# Patient Record
Sex: Male | Born: 1945 | Race: White | Hispanic: No | Marital: Married | Smoking: Former smoker
Health system: Southern US, Community
[De-identification: ages and names within clinical notes are randomized; demographics above are authoritative.]

## PROBLEM LIST (undated history)

## (undated) DIAGNOSIS — Z79899 Other long term (current) drug therapy: Secondary | ICD-10-CM

## (undated) DIAGNOSIS — I10 Essential (primary) hypertension: Secondary | ICD-10-CM

## (undated) DIAGNOSIS — C679 Malignant neoplasm of bladder, unspecified: Secondary | ICD-10-CM

## (undated) DIAGNOSIS — R319 Hematuria, unspecified: Secondary | ICD-10-CM

## (undated) DIAGNOSIS — R55 Syncope and collapse: Secondary | ICD-10-CM

## (undated) DIAGNOSIS — J449 Chronic obstructive pulmonary disease, unspecified: Secondary | ICD-10-CM

## (undated) DIAGNOSIS — T82897A Other specified complication of cardiac prosthetic devices, implants and grafts, initial encounter: Secondary | ICD-10-CM

## (undated) DIAGNOSIS — R7989 Other specified abnormal findings of blood chemistry: Secondary | ICD-10-CM

## (undated) DIAGNOSIS — R0600 Dyspnea, unspecified: Secondary | ICD-10-CM

## (undated) DIAGNOSIS — K52832 Lymphocytic colitis: Secondary | ICD-10-CM

## (undated) DIAGNOSIS — L409 Psoriasis, unspecified: Secondary | ICD-10-CM

## (undated) DIAGNOSIS — F419 Anxiety disorder, unspecified: Secondary | ICD-10-CM

## (undated) DIAGNOSIS — F1010 Alcohol abuse, uncomplicated: Secondary | ICD-10-CM

## (undated) DIAGNOSIS — H9190 Unspecified hearing loss, unspecified ear: Secondary | ICD-10-CM

## (undated) DIAGNOSIS — I5022 Chronic systolic (congestive) heart failure: Secondary | ICD-10-CM

## (undated) DIAGNOSIS — I495 Sick sinus syndrome: Secondary | ICD-10-CM

## (undated) DIAGNOSIS — Z7901 Long term (current) use of anticoagulants: Secondary | ICD-10-CM

## (undated) DIAGNOSIS — G4733 Obstructive sleep apnea (adult) (pediatric): Secondary | ICD-10-CM

## (undated) DIAGNOSIS — Z950 Presence of cardiac pacemaker: Secondary | ICD-10-CM

## (undated) DIAGNOSIS — I429 Cardiomyopathy, unspecified: Secondary | ICD-10-CM

## (undated) DIAGNOSIS — I493 Ventricular premature depolarization: Secondary | ICD-10-CM

## (undated) DIAGNOSIS — F32A Depression, unspecified: Secondary | ICD-10-CM

## (undated) DIAGNOSIS — N3289 Other specified disorders of bladder: Secondary | ICD-10-CM

## (undated) DIAGNOSIS — I482 Chronic atrial fibrillation, unspecified: Secondary | ICD-10-CM

## (undated) DIAGNOSIS — E785 Hyperlipidemia, unspecified: Secondary | ICD-10-CM

## (undated) DIAGNOSIS — I455 Other specified heart block: Secondary | ICD-10-CM

## (undated) DIAGNOSIS — K649 Unspecified hemorrhoids: Secondary | ICD-10-CM

## (undated) DIAGNOSIS — I700 Atherosclerosis of aorta: Secondary | ICD-10-CM

## (undated) HISTORY — DX: Other specified abnormal findings of blood chemistry: R79.89

## (undated) HISTORY — PX: EYE SURGERY: SHX253

## (undated) HISTORY — DX: Long term (current) use of anticoagulants: Z79.01

---

## 2016-10-22 DIAGNOSIS — Z9841 Cataract extraction status, right eye: Secondary | ICD-10-CM

## 2016-10-22 HISTORY — PX: CATARACT EXTRACTION: SUR2

## 2016-10-22 HISTORY — PX: EYE SURGERY: SHX253

## 2016-10-22 HISTORY — DX: Cataract extraction status, right eye: Z98.41

## 2018-09-29 DIAGNOSIS — H25811 Combined forms of age-related cataract, right eye: Secondary | ICD-10-CM | POA: Insufficient documentation

## 2018-10-28 DIAGNOSIS — N529 Male erectile dysfunction, unspecified: Secondary | ICD-10-CM | POA: Insufficient documentation

## 2018-12-01 DIAGNOSIS — H25812 Combined forms of age-related cataract, left eye: Secondary | ICD-10-CM | POA: Insufficient documentation

## 2019-05-18 DIAGNOSIS — S62101A Fracture of unspecified carpal bone, right wrist, initial encounter for closed fracture: Secondary | ICD-10-CM | POA: Insufficient documentation

## 2020-09-28 ENCOUNTER — Emergency Department: Payer: Medicare Other

## 2020-09-28 ENCOUNTER — Encounter: Payer: Self-pay | Admitting: Emergency Medicine

## 2020-09-28 ENCOUNTER — Other Ambulatory Visit: Payer: Self-pay

## 2020-09-28 ENCOUNTER — Inpatient Hospital Stay
Admission: EM | Admit: 2020-09-28 | Discharge: 2020-10-01 | DRG: 229 | Disposition: A | Discharge status: Home Health | Payer: Medicare Other | Attending: Family Medicine | Admitting: Family Medicine

## 2020-09-28 DIAGNOSIS — S01111A Laceration without foreign body of right eyelid and periocular area, initial encounter: Secondary | ICD-10-CM | POA: Diagnosis present

## 2020-09-28 DIAGNOSIS — F1010 Alcohol abuse, uncomplicated: Secondary | ICD-10-CM

## 2020-09-28 DIAGNOSIS — R7989 Other specified abnormal findings of blood chemistry: Secondary | ICD-10-CM

## 2020-09-28 DIAGNOSIS — I4891 Unspecified atrial fibrillation: Secondary | ICD-10-CM | POA: Diagnosis not present

## 2020-09-28 DIAGNOSIS — E860 Dehydration: Secondary | ICD-10-CM | POA: Diagnosis present

## 2020-09-28 DIAGNOSIS — M19011 Primary osteoarthritis, right shoulder: Secondary | ICD-10-CM | POA: Diagnosis present

## 2020-09-28 DIAGNOSIS — Z20822 Contact with and (suspected) exposure to covid-19: Secondary | ICD-10-CM | POA: Diagnosis present

## 2020-09-28 DIAGNOSIS — I495 Sick sinus syndrome: Secondary | ICD-10-CM | POA: Diagnosis present

## 2020-09-28 DIAGNOSIS — W19XXXA Unspecified fall, initial encounter: Secondary | ICD-10-CM | POA: Diagnosis present

## 2020-09-28 DIAGNOSIS — R52 Pain, unspecified: Secondary | ICD-10-CM

## 2020-09-28 DIAGNOSIS — R55 Syncope and collapse: Secondary | ICD-10-CM

## 2020-09-28 DIAGNOSIS — E872 Acidosis: Secondary | ICD-10-CM | POA: Diagnosis present

## 2020-09-28 DIAGNOSIS — F410 Panic disorder [episodic paroxysmal anxiety] without agoraphobia: Secondary | ICD-10-CM | POA: Diagnosis present

## 2020-09-28 DIAGNOSIS — F419 Anxiety disorder, unspecified: Secondary | ICD-10-CM

## 2020-09-28 DIAGNOSIS — R32 Unspecified urinary incontinence: Secondary | ICD-10-CM | POA: Diagnosis present

## 2020-09-28 HISTORY — DX: Anxiety disorder, unspecified: F41.9

## 2020-09-28 LAB — CBC WITH DIFFERENTIAL/PLATELET
Abs Immature Granulocytes: 0.04 10*3/uL (ref 0.00–0.07)
Basophils Absolute: 0 10*3/uL (ref 0.0–0.1)
Basophils Relative: 0 %
Eosinophils Absolute: 0.1 10*3/uL (ref 0.0–0.5)
Eosinophils Relative: 2 %
HCT: 40.3 % (ref 39.0–52.0)
Hemoglobin: 14 g/dL (ref 13.0–17.0)
Immature Granulocytes: 1 %
Lymphocytes Relative: 12 %
Lymphs Abs: 1 10*3/uL (ref 0.7–4.0)
MCH: 35.4 pg — ABNORMAL HIGH (ref 26.0–34.0)
MCHC: 34.7 g/dL (ref 30.0–36.0)
MCV: 102 fL — ABNORMAL HIGH (ref 80.0–100.0)
Monocytes Absolute: 0.8 10*3/uL (ref 0.1–1.0)
Monocytes Relative: 10 %
Neutro Abs: 6.5 10*3/uL (ref 1.7–7.7)
Neutrophils Relative %: 75 %
Platelets: 244 10*3/uL (ref 150–400)
RBC: 3.95 MIL/uL — ABNORMAL LOW (ref 4.22–5.81)
RDW: 13.1 % (ref 11.5–15.5)
WBC: 8.5 10*3/uL (ref 4.0–10.5)
nRBC: 0 % (ref 0.0–0.2)

## 2020-09-28 NOTE — ED Triage Notes (Signed)
Pt to ED via EMS coming from home. Pt family state that pt loss consciousness and fell to the floor. Pt states that he has has dizzy spells over the past few months. EMS stated that pt seemed to have a potential new onset of Afib. Pt has taken valium 5mg  two hours ago and has consumed a little alcohol. CBG was within normal limits. Pt has laceration and abrasions on the right. HR ran from 100-150s. BP130/90.

## 2020-09-28 NOTE — ED Provider Notes (Signed)
[facility] Emergency Department Provider Note   ____________________________________________   First MD Initiated Contact with Patient 09/28/20 2131     (approximate)  I have reviewed the triage vital signs and the nursing notes.   HISTORY  Chief Complaint Loss of Consciousness  Chief complaint is syncope  HPI Ewan Grau is a 74 y.o. male who comes from home.  He is recently moved back here from [place].  There he was felt to have anxiety and had a couple blackout spells where he he was taking Zoloft and Valium.  He says these helped him.  Today he was in the bathroom and seems to have passed out and hit his head.  He was incontinent as well.  Patient does not remember the episode.  He is back to baseline now.  He was not obtunded as I understand.  Patient does not have any history of irregular heartbeat or A. fib.         History reviewed. No pertinent past medical history. Past medical history per patient is only anxiety There are no problems to display for this patient. Patient does not currently remember his medicines except for Valium anything Zoloft.   Prior to Admission medications   Not on File    Allergies Patient has no allergy information on record.  History reviewed. No pertinent family history.  Social History Social History   Tobacco Use  . Smoking status: Never Smoker  . Smokeless tobacco: Never Used  Substance Use Topics  . Alcohol use: Yes    Alcohol/week: 21.0 standard drinks    Types: 21 Cans of beer per week    Comment: 3 beers a day  . Drug use: Never    Review of Systems  Constitutional: No fever/chills Eyes: No visual changes. ENT: No sore throat. Cardiovascular: Denies chest pain. Respiratory: Denies shortness of breath. Gastrointestinal: No abdominal pain.  No nausea, no vomiting.  No diarrhea.  No constipation. Genitourinary: Negative for dysuria. Musculoskeletal: Negative for back pain. Skin:  Negative for rash. Neurological: Negative for headaches, focal weakness   ____________________________________________   PHYSICAL EXAM:  VITAL SIGNS: ED Triage Vitals  Enc Vitals Group     BP      Pulse      Resp      Temp      Temp src      SpO2      Weight      Height      Head Circumference      Peak Flow      Pain Score      Pain Loc      Pain Edu?      Excl. in Lane?     Constitutional: Alert and oriented. Well appearing and in no acute distress. Eyes: Conjunctivae are normal. PER. EOMI. Head: 2 superficial abrasions on the right side of the face just above and just below the orbit Nose: No congestion/rhinnorhea. Mouth/Throat: Mucous membranes are moist.  Oropharynx non-erythematous. Neck: No stridor.  No cervical spine tenderness to palpation. Cardiovascular: Normal rate, regular rhythm. Grossly normal heart sounds.  Good peripheral circulation. Respiratory: Normal respiratory effort.  No retractions. Lungs CTAB. Gastrointestinal: Soft and nontender. No distention. No abdominal bruits.  Musculoskeletal: No lower extremity tenderness nor edema.   Neurologic:  Normal speech and language. No gross focal neurologic deficits are appreciated. No gait instability. Skin:  Skin is warm, dry and intact. No rash noted. Psychiatric: Mood and affect are normal. Speech and  behavior are normal.  ____________________________________________   LABS (all labs ordered are listed, but only abnormal results are displayed)  Labs Reviewed  COMPREHENSIVE METABOLIC PANEL  LACTIC ACID, PLASMA  LACTIC ACID, PLASMA  BRAIN NATRIURETIC PEPTIDE  CBC WITH DIFFERENTIAL/PLATELET  TSH  TROPONIN I (HIGH SENSITIVITY)  TROPONIN I (HIGH SENSITIVITY)   ____________________________________________  EKG EMS EKG shows A. fib with a rate up to 112 no obvious acute ST-T changes.  ____________________________________________  RADIOLOGY Gertha Calkin, personally viewed and evaluated these  images (plain radiographs) as part of my medical decision making, as well as reviewing the written report by the radiologist.  ED MD interpretation: CT of the head and neck show no acute changes radiology read the films and I reviewed them.  Official radiology report(s): CT Head Wo Contrast  Result Date: 09/28/2020 CLINICAL DATA:  Loss of consciousness EXAM: CT HEAD WITHOUT CONTRAST TECHNIQUE: Contiguous axial images were obtained from the base of the skull through the vertex without intravenous contrast. COMPARISON:  None. FINDINGS: Brain: No evidence of acute territorial infarction, hemorrhage, hydrocephalus,extra-axial collection or mass lesion/mass effect. There is dilatation the ventricles and sulci consistent with age-related atrophy. Low-attenuation changes in the deep white matter consistent with small vessel ischemia. Vascular: No hyperdense vessel or unexpected calcification. Skull: The skull is intact. No fracture or focal lesion identified. Sinuses/Orbits: The visualized paranasal sinuses and mastoid air cells are clear. The orbits and globes intact. Other: None Cervical spine: Alignment: Physiologic Skull base and vertebrae: Visualized skull base is intact. No atlanto-occipital dissociation. The vertebral body heights are well maintained. No fracture or pathologic osseous lesion seen. Soft tissues and spinal canal: The visualized paraspinal soft tissues are unremarkable. No prevertebral soft tissue swelling is seen. The spinal canal is grossly unremarkable, no large epidural collection or significant canal narrowing. Disc levels: Multilevel cervical spine spondylosis is seen with disc osteophyte complex and uncovertebral osteophytes most notable at C5-C6 with severe bilateral neural foraminal narrowing and moderate central canal stenosis. Upper chest: Paraseptal emphysematous changes seen at both lung apices. Thoracic inlet is within normal limits. Other: None IMPRESSION: No acute intracranial  abnormality. Findings consistent with age related atrophy and chronic small vessel ischemia No acute fracture or malalignment of the spine. Electronically Signed   By: Prudencio Pair M.D.   On: 09/28/2020 22:21   CT Cervical Spine Wo Contrast  Result Date: 09/28/2020 CLINICAL DATA:  Loss of consciousness EXAM: CT HEAD WITHOUT CONTRAST TECHNIQUE: Contiguous axial images were obtained from the base of the skull through the vertex without intravenous contrast. COMPARISON:  None. FINDINGS: Brain: No evidence of acute territorial infarction, hemorrhage, hydrocephalus,extra-axial collection or mass lesion/mass effect. There is dilatation the ventricles and sulci consistent with age-related atrophy. Low-attenuation changes in the deep white matter consistent with small vessel ischemia. Vascular: No hyperdense vessel or unexpected calcification. Skull: The skull is intact. No fracture or focal lesion identified. Sinuses/Orbits: The visualized paranasal sinuses and mastoid air cells are clear. The orbits and globes intact. Other: None Cervical spine: Alignment: Physiologic Skull base and vertebrae: Visualized skull base is intact. No atlanto-occipital dissociation. The vertebral body heights are well maintained. No fracture or pathologic osseous lesion seen. Soft tissues and spinal canal: The visualized paraspinal soft tissues are unremarkable. No prevertebral soft tissue swelling is seen. The spinal canal is grossly unremarkable, no large epidural collection or significant canal narrowing. Disc levels: Multilevel cervical spine spondylosis is seen with disc osteophyte complex and uncovertebral osteophytes most notable at C5-C6 with  severe bilateral neural foraminal narrowing and moderate central canal stenosis. Upper chest: Paraseptal emphysematous changes seen at both lung apices. Thoracic inlet is within normal limits. Other: None IMPRESSION: No acute intracranial abnormality. Findings consistent with age related  atrophy and chronic small vessel ischemia No acute fracture or malalignment of the spine. Electronically Signed   By: Prudencio Pair M.D.   On: 09/28/2020 22:21    ____________________________________________   PROCEDURES  Procedure(s) performed (including Critical Care):  Procedures   ____________________________________________   INITIAL IMPRESSION / ASSESSMENT AND PLAN / ED COURSE  Patient with brief syncopal episodes starting about 15 months ago.  And apparently his doctor initially diagnosed him as possible anxiety.  These may have been atrial fibrillation episodes to begin with.  We will get the patient in the hospital in do a cardiac echo and evaluate him to see if we can cardiovert him or just rate control.  Make sure there is no other apparent causes for his syncope.  Patient reports he had his last tetanus shot in 2016 which should be about 5 years ago his abrasions do not appear to be deep and that should be adequate for him.             ____________________________________________   FINAL CLINICAL IMPRESSION(S) / ED DIAGNOSES  Final diagnoses:  Syncope and collapse  New onset atrial fibrillation (HCC)  Atrial fibrillation with [facility])     ED Discharge Orders    None      *Please note:  Vence Lalor was evaluated in Emergency Department on 09/28/2020 for the symptoms described in the history of present illness. He was evaluated in the context of the global COVID-19 pandemic, which necessitated consideration that the patient might be at risk for infection with the SARS-CoV-2 virus that causes COVID-19. Institutional protocols and algorithms that pertain to the evaluation of patients at risk for COVID-19 are in a state of rapid change based on information released by regulatory bodies including the CDC and federal and state organizations. These policies and algorithms were followed during the patient's care in the ED.  Some ED evaluations and interventions may be  delayed as a result of limited staffing during and the pandemic.*   Note:  This document was prepared using Dragon voice recognition software and may include unintentional dictation errors.    Nena Polio, MD 09/28/20 2342

## 2020-09-29 ENCOUNTER — Inpatient Hospital Stay: Payer: Medicare Other

## 2020-09-29 ENCOUNTER — Emergency Department: Payer: Medicare Other

## 2020-09-29 ENCOUNTER — Inpatient Hospital Stay
Admit: 2020-09-29 | Discharge: 2020-09-29 | Disposition: A | Discharge status: Home / Self Care | Payer: Medicare Other | Attending: Internal Medicine | Admitting: Internal Medicine

## 2020-09-29 ENCOUNTER — Encounter: Payer: Self-pay | Admitting: Internal Medicine

## 2020-09-29 DIAGNOSIS — Z20822 Contact with and (suspected) exposure to covid-19: Secondary | ICD-10-CM | POA: Diagnosis present

## 2020-09-29 DIAGNOSIS — F1010 Alcohol abuse, uncomplicated: Secondary | ICD-10-CM | POA: Diagnosis present

## 2020-09-29 DIAGNOSIS — R7989 Other specified abnormal findings of blood chemistry: Secondary | ICD-10-CM

## 2020-09-29 DIAGNOSIS — W19XXXA Unspecified fall, initial encounter: Secondary | ICD-10-CM | POA: Diagnosis present

## 2020-09-29 DIAGNOSIS — E872 Acidosis: Secondary | ICD-10-CM | POA: Diagnosis present

## 2020-09-29 DIAGNOSIS — E860 Dehydration: Secondary | ICD-10-CM | POA: Diagnosis present

## 2020-09-29 DIAGNOSIS — R55 Syncope and collapse: Secondary | ICD-10-CM | POA: Diagnosis present

## 2020-09-29 DIAGNOSIS — M19011 Primary osteoarthritis, right shoulder: Secondary | ICD-10-CM | POA: Diagnosis present

## 2020-09-29 DIAGNOSIS — I4891 Unspecified atrial fibrillation: Secondary | ICD-10-CM | POA: Diagnosis present

## 2020-09-29 DIAGNOSIS — S01111A Laceration without foreign body of right eyelid and periocular area, initial encounter: Secondary | ICD-10-CM | POA: Diagnosis present

## 2020-09-29 DIAGNOSIS — F419 Anxiety disorder, unspecified: Secondary | ICD-10-CM

## 2020-09-29 DIAGNOSIS — I495 Sick sinus syndrome: Secondary | ICD-10-CM | POA: Diagnosis present

## 2020-09-29 DIAGNOSIS — R32 Unspecified urinary incontinence: Secondary | ICD-10-CM | POA: Diagnosis present

## 2020-09-29 DIAGNOSIS — F410 Panic disorder [episodic paroxysmal anxiety] without agoraphobia: Secondary | ICD-10-CM | POA: Diagnosis present

## 2020-09-29 LAB — COMPREHENSIVE METABOLIC PANEL
ALT: 44 U/L (ref 0–44)
AST: 76 U/L — ABNORMAL HIGH (ref 15–41)
Albumin: 3.7 g/dL (ref 3.5–5.0)
Alkaline Phosphatase: 49 U/L (ref 38–126)
Anion gap: 10 (ref 5–15)
BUN: 10 mg/dL (ref 8–23)
CO2: 26 mmol/L (ref 22–32)
Calcium: 7.8 mg/dL — ABNORMAL LOW (ref 8.9–10.3)
Chloride: 102 mmol/L (ref 98–111)
Creatinine, Ser: 0.59 mg/dL — ABNORMAL LOW (ref 0.61–1.24)
GFR, Estimated: >60 mL/min (ref >60)
Glucose, Bld: 86 mg/dL (ref 70–99)
Potassium: 4.3 mmol/L (ref 3.5–5.1)
Sodium: 138 mmol/L (ref 135–145)
Total Bilirubin: 0.8 mg/dL (ref 0.3–1.2)
Total Protein: 6.5 g/dL (ref 6.5–8.1)

## 2020-09-29 LAB — BASIC METABOLIC PANEL
Anion gap: 10 (ref 5–15)
BUN: 10 mg/dL (ref 8–23)
CO2: 22 mmol/L (ref 22–32)
Calcium: 8.6 mg/dL — ABNORMAL LOW (ref 8.9–10.3)
Chloride: 107 mmol/L (ref 98–111)
Creatinine, Ser: 0.66 mg/dL (ref 0.61–1.24)
GFR, Estimated: >60 mL/min (ref >60)
Glucose, Bld: 93 mg/dL (ref 70–99)
Potassium: 3.8 mmol/L (ref 3.5–5.1)
Sodium: 139 mmol/L (ref 135–145)

## 2020-09-29 LAB — ECHOCARDIOGRAM COMPLETE
AR max vel: 2.63 cm2
AV Area VTI: 2.35 cm2
AV Area mean vel: 2.57 cm2
AV Mean grad: 5 mmHg
AV Peak grad: 8.5 mmHg
Ao pk vel: 1.46 m/s
Area-P 1/2: 4.1 cm2
Height: 70.5 in
S' Lateral: 3.88 cm
Weight: 2304 oz

## 2020-09-29 LAB — LACTIC ACID, PLASMA
Lactic Acid, Venous: 2.9 mmol/L (ref 0.5–1.9)
Lactic Acid, Venous: 1.1 mmol/L (ref 0.5–1.9)

## 2020-09-29 LAB — TSH: TSH: 2.721 u[IU]/mL (ref 0.350–4.500)

## 2020-09-29 LAB — CREATININE, SERUM
Creatinine, Ser: 0.55 mg/dL — ABNORMAL LOW (ref 0.61–1.24)
GFR, Estimated: >60 mL/min (ref >60)

## 2020-09-29 LAB — PHOSPHORUS: Phosphorus: 3.3 mg/dL (ref 2.5–4.6)

## 2020-09-29 LAB — BRAIN NATRIURETIC PEPTIDE: B Natriuretic Peptide: 223.1 pg/mL — ABNORMAL HIGH (ref 0.0–100.0)

## 2020-09-29 LAB — RESP PANEL BY RT-PCR (FLU A&B, COVID) ARPGX2
Influenza A by PCR: NEGATIVE
Influenza B by PCR: NEGATIVE
SARS Coronavirus 2 by RT PCR: NEGATIVE

## 2020-09-29 LAB — TROPONIN I (HIGH SENSITIVITY)
Troponin I (High Sensitivity): 7 ng/L (ref <18)
Troponin I (High Sensitivity): 8 ng/L (ref <18)

## 2020-09-29 LAB — MAGNESIUM: Magnesium: 1.7 mg/dL (ref 1.7–2.4)

## 2020-09-29 LAB — CBG MONITORING, ED: Glucose-Capillary: 95 mg/dL (ref 70–99)

## 2020-09-29 MED ORDER — ENOXAPARIN SODIUM 40 MG/0.4ML ~~LOC~~ SOLN: 40.0000 mg | SUBCUTANEOUS | Status: DC

## 2020-09-29 MED ORDER — THIAMINE HCL 100 MG PO TABS
100.0000 mg | ORAL_TABLET | Freq: Every day | ORAL | Status: DC
  Administered 2020-09-29: 100 mg via ORAL
  Filled 2020-09-29: qty 1

## 2020-09-29 MED ORDER — APIXABAN 5 MG PO TABS
5.0000 mg | ORAL_TABLET | Freq: Two times a day (BID) | ORAL | Status: DC
  Administered 2020-09-29 (×2): 5 mg via ORAL
  Filled 2020-09-29 (×2): qty 1

## 2020-09-29 MED ORDER — ADULT MULTIVITAMIN W/MINERALS CH
1.0000 | ORAL_TABLET | Freq: Every day | ORAL | Status: DC
  Administered 2020-09-29: 1 via ORAL
  Filled 2020-09-29: qty 1

## 2020-09-29 MED ORDER — FOLIC ACID 1 MG PO TABS
1.0000 mg | ORAL_TABLET | Freq: Every day | ORAL | Status: DC
  Administered 2020-09-29: 1 mg via ORAL
  Filled 2020-09-29: qty 1

## 2020-09-29 MED ORDER — THIAMINE HCL 100 MG/ML IJ SOLN: 100.0000 mg | Freq: Every day | INTRAMUSCULAR | Status: DC

## 2020-09-29 MED ORDER — SODIUM CHLORIDE 0.9 % IV SOLN: INTRAVENOUS | Status: DC

## 2020-09-29 MED ORDER — SODIUM CHLORIDE 0.9% FLUSH
3.0000 mL | Freq: Two times a day (BID) | INTRAVENOUS | Status: DC
  Administered 2020-09-29 – 2020-09-30 (×3): 3 mL via INTRAVENOUS

## 2020-09-29 MED ORDER — LORAZEPAM 2 MG/ML IJ SOLN: 0.0000 mg | Freq: Four times a day (QID) | INTRAMUSCULAR | Status: AC

## 2020-09-29 MED ORDER — ONDANSETRON HCL 4 MG PO TABS: 4.0000 mg | ORAL_TABLET | Freq: Four times a day (QID) | ORAL | Status: DC | PRN

## 2020-09-29 MED ORDER — LORAZEPAM 1 MG PO TABS: 1.0000 mg | ORAL_TABLET | ORAL | Status: DC | PRN

## 2020-09-29 MED ORDER — ACETAMINOPHEN 650 MG RE SUPP: 650.0000 mg | Freq: Four times a day (QID) | RECTAL | Status: DC | PRN

## 2020-09-29 MED ORDER — ONDANSETRON HCL 4 MG/2ML IJ SOLN: 4.0000 mg | Freq: Four times a day (QID) | INTRAMUSCULAR | Status: DC | PRN

## 2020-09-29 MED ORDER — ACETAMINOPHEN 325 MG PO TABS
650.0000 mg | ORAL_TABLET | Freq: Four times a day (QID) | ORAL | Status: DC | PRN
  Administered 2020-09-29: 650 mg via ORAL
  Filled 2020-09-29 (×2): qty 2

## 2020-09-29 MED ORDER — LORAZEPAM 2 MG/ML IJ SOLN: 0.0000 mg | Freq: Two times a day (BID) | INTRAMUSCULAR | Status: DC

## 2020-09-29 MED ORDER — LORAZEPAM 2 MG/ML IJ SOLN: 1.0000 mg | INTRAMUSCULAR | Status: DC | PRN

## 2020-09-29 NOTE — ED Notes (Signed)
Date and time results received: 09/29/20    Test: Lactic Acid Critical Value: 2.9  Name of Provider Notified: Cinda Quest, MD

## 2020-09-29 NOTE — ED Notes (Signed)
Cardiology MD notified. No orders at this time.

## 2020-09-29 NOTE — ED Notes (Signed)
Per Cardiology PA "The patient has been seen. We are going to start him on Eliquis, defer rate-control medication at this time, continue to monitor on telemetry, and echo"

## 2020-09-29 NOTE — ED Notes (Signed)
Pt given breakfast tray at this time. 

## 2020-09-29 NOTE — ED Notes (Signed)
X-ray at bedside

## 2020-09-29 NOTE — ED Notes (Signed)
Messaged cardiology per hospitalist order at this time, awaiting response

## 2020-09-29 NOTE — Progress Notes (Signed)
PROGRESS NOTE    Collin Knight  DXI:[id number] DOB: [birth year] DOA: 09/28/2020 PCP: [name], MD   Brief Narrative:  HPI: Collin Knight is a 74 y.o. male with medical history significant for anxiety, alcohol use, who presents to the emergency room after an episode of blacking out while in the bathroom.  He has no recollection of the incident but appeared to have hit his head.  He was back to baseline by arrival to the emergency room.  States he has a history of recurrent dizzy spells and falls but this has been the worst one yet.  Patient admits to drinking about 3 beers daily.  Prior to the episode he was in his usual state of health.  Denies preceding headache, visual disturbance, chest pain, shortness of breath or palpitations, lightheadedness or one-sided numbness weakness or tingling.  Was incontinent of urine while he was out.  EMS reported heart rate 100-150 A. Fib.  Patient reportedly took 2 Valium with some alcohol earlier in the day per report. ED Course: On arrival afebrile, BP 141/71 heart rate 114 with A. fib on monitor.  O2 sat 100% on room air.  While in the ER patient noted to be flipping in and out of A. fib with RVR with rates up to 130 alternating with sinus bradycardia rate in the 50s.  On blood work, troponin was 8, BNP 223, lactic acid 2.9.  TSH 2.7.  Other blood work unremarkable. EKG as reviewed by me : Sinus rhythm at 62 Imaging: Chest x-ray clear.  CT head and C-spine without evidence of acute trauma Patient hydrated in the ER.  Hospitalist consulted for admission.  While awaiting admission, patient had sinus pauses, asymptomatic, duration up to 7 seconds and paced pads placed  Assessment & Plan:   Principal Problem:   Rapid atrial fibrillation (HCC) Active Problems:   Recurrent syncope   Anxiety   Alcohol use disorder, mild, abuse   Elevated lactic acid level   New onset atrial fibrillation with RVR/tachybradycardia syndrome: Patient was detected to have new  onset atrial fibrillation with RVR and then had few pauses with the longest being 7-second.  Has been having tachycardia and bradycardia.  Cardiology has been consulted.  He has been started on Eliquis 5 mg p.o. twice daily.  Echo pending.  Defer further management to cardiology.   Recurrent syncope: Per patient, he has had approximately 6-7 episodes of passing out in last 17 months and never sought any medical attention for that.  This is the first time he presented to ED.  His syncope is likely cardiogenic.  CT head negative for any acute pathology.  EEG is pending.  Monitor for now.  Anxiety with history of panic attacks: Consider SSRI to replace anxiolytics for treatment at the time of discharge.  Alcohol use disorder, mild, abuse: No signs of withdrawal.  Continue CIWA protocol.  Lactic acidosis: Likely due to dehydration.  Resolved.  Monitor.  Right shoulder pain: Complains of right shoulder pain since the fall.  Will obtain x-ray.  DVT prophylaxis:    Code Status: Full Code  Family Communication: Patient's wife present at bedside.  Plan of care discussed with patient in length with him and his wife and he verbalized understanding and agreed with it.  Status is: Inpatient  Remains inpatient appropriate because:Inpatient level of care appropriate due to severity of illness   Dispo: The patient is from: Home              Anticipated d/c  is to: Home              Anticipated d/c date is: 1 day              Patient currently is not medically stable to d/c.        Estimated body mass index is 20.37 kg/m as calculated from the following:   Height as of this encounter: 5' 10.5" (1.791 m).   Weight as of this encounter: 65.3 kg.      Nutritional status:               Consultants:   Cardiology  Procedures:   None  Antimicrobials:  Anti-infectives (From admission, onward)   None         Subjective: Patient seen and examined.  His wife at the bedside.   Complains of right shoulder pain and some anxiety.  Denied any chest pain, shortness of breath.    Objective: Vitals:   09/29/20 0739 09/29/20 0830 09/29/20 0930 09/29/20 1030  BP:  123/88 (!) 111/91 (!) 138/98  Pulse:  (!) 106 87 81  Resp:  15 15 14   Temp:      TempSrc:      SpO2:  96% 96% 95%  Weight: 65.3 kg     Height: 5' 10.5" (1.791 m)      No intake or output data in the 24 hours ending 09/29/20 1124 Filed Weights   09/28/20 2159 09/29/20 0739  Weight: 65.3 kg 65.3 kg    Examination:  General exam: Appears calm and comfortable, laceration over right eyebrow Respiratory system: Clear to auscultation. Respiratory effort normal. Cardiovascular system: S1 & S2 heard, irregularly irregular rate and rhythm. No JVD, murmurs, rubs, gallops or clicks. No pedal edema. Gastrointestinal system: Abdomen is nondistended, soft and nontender. No organomegaly or masses felt. Normal bowel sounds heard. Central nervous system: Alert and oriented. No focal neurological deficits. Extremities: Symmetric 5 x 5 power. Skin: No rashes, lesions or ulcers Psychiatry: Judgement and insight appear normal. Mood & affect appropriate.    Data Reviewed: I have personally reviewed following labs and imaging studies  CBC: Recent Labs  Lab 09/28/20 2246  WBC 8.5  NEUTROABS 6.5  HGB 14.0  HCT 40.3  MCV 102.0*  PLT 242   Basic Metabolic Panel: Recent Labs  Lab 09/28/20 2246 09/29/20 0701  NA 138  --   K 4.3  --   CL 102  --   CO2 26  --   GLUCOSE 86  --   BUN 10  --   CREATININE 0.59* 0.55*  CALCIUM 7.8*  --   MG  --  1.7  PHOS  --  3.3   GFR: Estimated Creatinine Clearance: 72.6 mL/min (A) (by C-G formula based on SCr of 0.55 mg/dL (L)). Liver Function Tests: Recent Labs  Lab 09/28/20 2246  AST 76*  ALT 44  ALKPHOS 49  BILITOT 0.8  PROT 6.5  ALBUMIN 3.7   No results for input(s): LIPASE, AMYLASE in the last 168 hours. No results for input(s): AMMONIA in the last 168  hours. Coagulation Profile: No results for input(s): INR, PROTIME in the last 168 hours. Cardiac Enzymes: No results for input(s): CKTOTAL, CKMB, CKMBINDEX, TROPONINI in the last 168 hours. BNP (last 3 results) No results for input(s): PROBNP in the last 8760 hours. HbA1C: No results for input(s): HGBA1C in the last 72 hours. CBG: Recent Labs  Lab 09/29/20 0700  GLUCAP 95   Lipid Profile: No results  for input(s): CHOL, HDL, LDLCALC, TRIG, CHOLHDL, LDLDIRECT in the last 72 hours. Thyroid Function Tests: Recent Labs    09/28/20 2246  TSH 2.721   Anemia Panel: No results for input(s): VITAMINB12, FOLATE, FERRITIN, TIBC, IRON, RETICCTPCT in the last 72 hours. Sepsis Labs: Recent Labs  Lab 09/28/20 2246 09/29/20 0702  LATICACIDVEN 2.9* 1.1    Recent Results (from the past 240 hour(s))  Resp Panel by RT-PCR (Flu A&B, Covid) Nasopharyngeal Swab     Status: None   Collection Time: 09/29/20  7:01 AM   Specimen: Nasopharyngeal Swab; Nasopharyngeal(NP) swabs in vial transport medium  Result Value Ref Range Status   SARS Coronavirus 2 by RT PCR NEGATIVE NEGATIVE Final    Comment: (NOTE) SARS-CoV-2 target nucleic acids are NOT DETECTED.  The SARS-CoV-2 RNA is generally detectable in upper respiratory specimens during the acute phase of infection. The lowest concentration of SARS-CoV-2 viral copies this assay can detect is 138 copies/mL. A negative result does not preclude SARS-Cov-2 infection and should not be used as the sole basis for treatment or other patient management decisions. A negative result may occur with  improper specimen collection/handling, submission of specimen other than nasopharyngeal swab, presence of viral mutation(s) within the areas targeted by this assay, and inadequate number of viral copies(<138 copies/mL). A negative result must be combined with clinical observations, patient history, and epidemiological information. The expected result is  Negative.  Fact Sheet for Patients:  EntrepreneurPulse.com.au  Fact Sheet for Healthcare Providers:  IncredibleEmployment.be  This test is no t yet approved or cleared by the [place] FDA and  has been authorized for detection and/or diagnosis of SARS-CoV-2 by FDA under an Emergency Use Authorization (EUA). This EUA will remain  in effect (meaning this test can be used) for the duration of the COVID-19 declaration under Section 564(b)(1) of the Act, 21 U.S.C.section 360bbb-3(b)(1), unless the authorization is terminated  or revoked sooner.       Influenza A by PCR NEGATIVE NEGATIVE Final   Influenza B by PCR NEGATIVE NEGATIVE Final    Comment: (NOTE) The Xpert Xpress SARS-CoV-2/FLU/RSV plus assay is intended as an aid in the diagnosis of influenza from Nasopharyngeal swab specimens and should not be used as a sole basis for treatment. Nasal washings and aspirates are unacceptable for Xpert Xpress SARS-CoV-2/FLU/RSV testing.  Fact Sheet for Patients: EntrepreneurPulse.com.au  Fact Sheet for Healthcare Providers: IncredibleEmployment.be  This test is not yet approved or cleared by the [place] FDA and has been authorized for detection and/or diagnosis of SARS-CoV-2 by FDA under an Emergency Use Authorization (EUA). This EUA will remain in effect (meaning this test can be used) for the duration of the COVID-19 declaration under Section 564(b)(1) of the Act, 21 U.S.C. section 360bbb-3(b)(1), unless the authorization is terminated or revoked.  Performed at [facility], [address] [place], [place] [address]       Radiology Studies: CT Head Wo Contrast  Result Date: 09/28/2020 CLINICAL DATA:  Loss of consciousness EXAM: CT HEAD WITHOUT CONTRAST TECHNIQUE: Contiguous axial images were obtained from the base of the skull through the vertex without intravenous contrast. COMPARISON:   None. FINDINGS: Brain: No evidence of acute territorial infarction, hemorrhage, hydrocephalus,extra-axial collection or mass lesion/mass effect. There is dilatation the ventricles and sulci consistent with age-related atrophy. Low-attenuation changes in the deep white matter consistent with small vessel ischemia. Vascular: No hyperdense vessel or unexpected calcification. Skull: The skull is intact. No fracture or focal lesion identified. Sinuses/Orbits: The  visualized paranasal sinuses and mastoid air cells are clear. The orbits and globes intact. Other: None Cervical spine: Alignment: Physiologic Skull base and vertebrae: Visualized skull base is intact. No atlanto-occipital dissociation. The vertebral body heights are well maintained. No fracture or pathologic osseous lesion seen. Soft tissues and spinal canal: The visualized paraspinal soft tissues are unremarkable. No prevertebral soft tissue swelling is seen. The spinal canal is grossly unremarkable, no large epidural collection or significant canal narrowing. Disc levels: Multilevel cervical spine spondylosis is seen with disc osteophyte complex and uncovertebral osteophytes most notable at C5-C6 with severe bilateral neural foraminal narrowing and moderate central canal stenosis. Upper chest: Paraseptal emphysematous changes seen at both lung apices. Thoracic inlet is within normal limits. Other: None IMPRESSION: No acute intracranial abnormality. Findings consistent with age related atrophy and chronic small vessel ischemia No acute fracture or malalignment of the spine. Electronically Signed   By: Prudencio Pair M.D.   On: 09/28/2020 22:21   CT Cervical Spine Wo Contrast  Result Date: 09/28/2020 CLINICAL DATA:  Loss of consciousness EXAM: CT HEAD WITHOUT CONTRAST TECHNIQUE: Contiguous axial images were obtained from the base of the skull through the vertex without intravenous contrast. COMPARISON:  None. FINDINGS: Brain: No evidence of acute territorial  infarction, hemorrhage, hydrocephalus,extra-axial collection or mass lesion/mass effect. There is dilatation the ventricles and sulci consistent with age-related atrophy. Low-attenuation changes in the deep white matter consistent with small vessel ischemia. Vascular: No hyperdense vessel or unexpected calcification. Skull: The skull is intact. No fracture or focal lesion identified. Sinuses/Orbits: The visualized paranasal sinuses and mastoid air cells are clear. The orbits and globes intact. Other: None Cervical spine: Alignment: Physiologic Skull base and vertebrae: Visualized skull base is intact. No atlanto-occipital dissociation. The vertebral body heights are well maintained. No fracture or pathologic osseous lesion seen. Soft tissues and spinal canal: The visualized paraspinal soft tissues are unremarkable. No prevertebral soft tissue swelling is seen. The spinal canal is grossly unremarkable, no large epidural collection or significant canal narrowing. Disc levels: Multilevel cervical spine spondylosis is seen with disc osteophyte complex and uncovertebral osteophytes most notable at C5-C6 with severe bilateral neural foraminal narrowing and moderate central canal stenosis. Upper chest: Paraseptal emphysematous changes seen at both lung apices. Thoracic inlet is within normal limits. Other: None IMPRESSION: No acute intracranial abnormality. Findings consistent with age related atrophy and chronic small vessel ischemia No acute fracture or malalignment of the spine. Electronically Signed   By: Prudencio Pair M.D.   On: 09/28/2020 22:21   DG Chest Portable 1 View  Result Date: 09/29/2020 CLINICAL DATA:  Syncope EXAM: PORTABLE CHEST 1 VIEW COMPARISON:  None. FINDINGS: The heart size and mediastinal contours are mildly enlarged. Aortic knob calcifications are seen. Both lungs are clear. The visualized skeletal structures are unremarkable. IMPRESSION: No active disease. Electronically Signed   By: Prudencio Pair M.D.   On: 09/29/2020 00:24    Scheduled Meds: . apixaban  5 mg Oral BID  . folic acid  1 mg Oral Daily  . LORazepam  0-4 mg Intravenous Q6H   Followed by  . [START ON 10/01/2020] LORazepam  0-4 mg Intravenous Q12H  . multivitamin with minerals  1 tablet Oral Daily  . sodium chloride flush  3 mL Intravenous Q12H  . thiamine  100 mg Oral Daily   Or  . thiamine  100 mg Intravenous Daily   Continuous Infusions: . sodium chloride 125 mL/hr at 09/29/20 0512     LOS: 0  days   Time spent: 36 minutes   Darliss Cheney, MD [facility]  09/29/2020, 11:24 AM   To contact the attending provider between 7A-7P or the covering provider during after hours 7P-7A, please log into the web site [email or web address].

## 2020-09-29 NOTE — ED Notes (Signed)
Ultrasound at bedside

## 2020-09-29 NOTE — H&P (Signed)
History and Physical    Collin Knight TOI:[id number] DOB: 1946/08/27 DOA: 09/28/2020  PCP: [name], MD   Patient coming from: Home  I have personally briefly reviewed patient's old medical records in [place]  Chief Complaint: Passed out  HPI: Collin Knight is a 74 y.o. male with medical history significant for anxiety, alcohol use, who presents to the emergency room after an episode of blacking out while in the bathroom.  He has no recollection of the incident but appeared to have hit his head.  He was back to baseline by arrival to the emergency room.  States he has a history of recurrent dizzy spells and falls but this has been the worst one yet.  Patient admits to drinking about 3 beers daily.  Prior to the episode he was in his usual state of health.  Denies preceding headache, visual disturbance, chest pain, shortness of breath or palpitations, lightheadedness or one-sided numbness weakness or tingling.  Was incontinent of urine while he was out.  EMS reported heart rate 100-150 A. Fib.  Patient reportedly took 2 Valium with some alcohol earlier in the day per report. ED Course: On arrival afebrile, BP 141/71 heart rate 114 with A. fib on monitor.  O2 sat 100% on room air.  While in the ER patient noted to be flipping in and out of A. fib with RVR with rates up to 130 alternating with sinus bradycardia rate in the 50s.  On blood work, troponin was 8, BNP 223, lactic acid 2.9.  TSH 2.7.  Other blood work unremarkable. EKG as reviewed by me : Sinus rhythm at 62 Imaging: Chest x-ray clear.  CT head and C-spine without evidence of acute trauma Patient hydrated in the ER.  Hospitalist consulted for admission.  While awaiting admission, patient had sinus pauses, asymptomatic, duration up to 7 seconds and paced pads placed   Review of Systems: As per HPI otherwise all other systems on review of systems negative.    Past Medical History:  Diagnosis Date  . Anxiety     History  reviewed. No pertinent surgical history.   reports that he has never smoked. He has never used smokeless tobacco. He reports current alcohol use of about 21.0 standard drinks of alcohol per week. He reports that he does not use drugs.  No Known Allergies  History reviewed. No pertinent family history.    Prior to Admission medications   Medication Sig Start Date End Date Taking? Authorizing Provider  diazepam (VALIUM) 5 MG tablet Take 5 mg by mouth 2 (two) times daily as needed. 09/13/20  Yes [provider]  sertraline (ZOLOFT) 25 MG tablet Take 25 mg by mouth daily. 09/06/20  Yes [provider]    Physical Exam: Vitals:   09/28/20 2158 09/28/20 2159  BP: (!) 141/71   Pulse: 94   Resp: 20   Temp: 98.6 F (37 C)   TempSrc: Oral   SpO2: 100%   Weight:  65.3 kg  Height:  5' 10.5" (1.791 m)     Vitals:   09/28/20 2158 09/28/20 2159  BP: (!) 141/71   Pulse: 94   Resp: 20   Temp: 98.6 F (37 C)   TempSrc: Oral   SpO2: 100%   Weight:  65.3 kg  Height:  5' 10.5" (1.791 m)      Constitutional: Alert and oriented x 3 . Not in any apparent distress HEENT:      Head: Normocephalic and laceration right eyebrow.  Eyes: PERLA, EOMI, Conjunctivae are normal. Sclera is non-icteric.       Mouth/Throat: Mucous membranes are moist.       Neck: Supple with no signs of meningismus. Cardiovascular:  Irregularly irregular and tachycardic at time of exam. No murmurs, gallops, or rubs. 2+ symmetrical distal pulses are present . No JVD. No LE edema Respiratory: Respiratory effort normal .Lungs sounds clear bilaterally. No wheezes, crackles, or rhonchi.  Gastrointestinal: Soft, non tender, and non distended with positive bowel sounds. No rebound or guarding. Genitourinary: No CVA tenderness. Musculoskeletal: Nontender with normal range of motion in all extremities. No cyanosis, or erythema of extremities. Neurologic:  Face is symmetric. Moving all extremities.  No gross focal neurologic deficits . Skin: Skin is warm, dry.  No rash or ulcers Psychiatric: Appears anxious   Labs on Admission: I have personally reviewed following labs and imaging studies  CBC: Recent Labs  Lab 09/28/20 2246  WBC 8.5  NEUTROABS 6.5  HGB 14.0  HCT 40.3  MCV 102.0*  PLT 326   Basic Metabolic Panel: Recent Labs  Lab 09/28/20 2246  NA 138  K 4.3  CL 102  CO2 26  GLUCOSE 86  BUN 10  CREATININE 0.59*  CALCIUM 7.8*   GFR: Estimated Creatinine Clearance: 72.6 mL/min (A) (by C-G formula based on SCr of 0.59 mg/dL (L)). Liver Function Tests: Recent Labs  Lab 09/28/20 2246  AST 76*  ALT 44  ALKPHOS 49  BILITOT 0.8  PROT 6.5  ALBUMIN 3.7   No results for input(s): LIPASE, AMYLASE in the last 168 hours. No results for input(s): AMMONIA in the last 168 hours. Coagulation Profile: No results for input(s): INR, PROTIME in the last 168 hours. Cardiac Enzymes: No results for input(s): CKTOTAL, CKMB, CKMBINDEX, TROPONINI in the last 168 hours. BNP (last 3 results) No results for input(s): PROBNP in the last 8760 hours. HbA1C: No results for input(s): HGBA1C in the last 72 hours. CBG: No results for input(s): GLUCAP in the last 168 hours. Lipid Profile: No results for input(s): CHOL, HDL, LDLCALC, TRIG, CHOLHDL, LDLDIRECT in the last 72 hours. Thyroid Function Tests: Recent Labs    09/28/20 2246  TSH 2.721   Anemia Panel: No results for input(s): VITAMINB12, FOLATE, FERRITIN, TIBC, IRON, RETICCTPCT in the last 72 hours. Urine analysis: No results found for: COLORURINE, APPEARANCEUR, LABSPEC, PHURINE, GLUCOSEU, HGBUR, BILIRUBINUR, KETONESUR, PROTEINUR, UROBILINOGEN, NITRITE, LEUKOCYTESUR  Radiological Exams on Admission: CT Head Wo Contrast  Result Date: 09/28/2020 CLINICAL DATA:  Loss of consciousness EXAM: CT HEAD WITHOUT CONTRAST TECHNIQUE: Contiguous axial images were obtained from the base of the skull through the vertex without  intravenous contrast. COMPARISON:  None. FINDINGS: Brain: No evidence of acute territorial infarction, hemorrhage, hydrocephalus,extra-axial collection or mass lesion/mass effect. There is dilatation the ventricles and sulci consistent with age-related atrophy. Low-attenuation changes in the deep white matter consistent with small vessel ischemia. Vascular: No hyperdense vessel or unexpected calcification. Skull: The skull is intact. No fracture or focal lesion identified. Sinuses/Orbits: The visualized paranasal sinuses and mastoid air cells are clear. The orbits and globes intact. Other: None Cervical spine: Alignment: Physiologic Skull base and vertebrae: Visualized skull base is intact. No atlanto-occipital dissociation. The vertebral body heights are well maintained. No fracture or pathologic osseous lesion seen. Soft tissues and spinal canal: The visualized paraspinal soft tissues are unremarkable. No prevertebral soft tissue swelling is seen. The spinal canal is grossly unremarkable, no large epidural collection or significant canal narrowing. Disc levels: Multilevel cervical spine  spondylosis is seen with disc osteophyte complex and uncovertebral osteophytes most notable at C5-C6 with severe bilateral neural foraminal narrowing and moderate central canal stenosis. Upper chest: Paraseptal emphysematous changes seen at both lung apices. Thoracic inlet is within normal limits. Other: None IMPRESSION: No acute intracranial abnormality. Findings consistent with age related atrophy and chronic small vessel ischemia No acute fracture or malalignment of the spine. Electronically Signed   By: Prudencio Pair M.D.   On: 09/28/2020 22:21   CT Cervical Spine Wo Contrast  Result Date: 09/28/2020 CLINICAL DATA:  Loss of consciousness EXAM: CT HEAD WITHOUT CONTRAST TECHNIQUE: Contiguous axial images were obtained from the base of the skull through the vertex without intravenous contrast. COMPARISON:  None. FINDINGS:  Brain: No evidence of acute territorial infarction, hemorrhage, hydrocephalus,extra-axial collection or mass lesion/mass effect. There is dilatation the ventricles and sulci consistent with age-related atrophy. Low-attenuation changes in the deep white matter consistent with small vessel ischemia. Vascular: No hyperdense vessel or unexpected calcification. Skull: The skull is intact. No fracture or focal lesion identified. Sinuses/Orbits: The visualized paranasal sinuses and mastoid air cells are clear. The orbits and globes intact. Other: None Cervical spine: Alignment: Physiologic Skull base and vertebrae: Visualized skull base is intact. No atlanto-occipital dissociation. The vertebral body heights are well maintained. No fracture or pathologic osseous lesion seen. Soft tissues and spinal canal: The visualized paraspinal soft tissues are unremarkable. No prevertebral soft tissue swelling is seen. The spinal canal is grossly unremarkable, no large epidural collection or significant canal narrowing. Disc levels: Multilevel cervical spine spondylosis is seen with disc osteophyte complex and uncovertebral osteophytes most notable at C5-C6 with severe bilateral neural foraminal narrowing and moderate central canal stenosis. Upper chest: Paraseptal emphysematous changes seen at both lung apices. Thoracic inlet is within normal limits. Other: None IMPRESSION: No acute intracranial abnormality. Findings consistent with age related atrophy and chronic small vessel ischemia No acute fracture or malalignment of the spine. Electronically Signed   By: Prudencio Pair M.D.   On: 09/28/2020 22:21   DG Chest Portable 1 View  Result Date: 09/29/2020 CLINICAL DATA:  Syncope EXAM: PORTABLE CHEST 1 VIEW COMPARISON:  None. FINDINGS: The heart size and mediastinal contours are mildly enlarged. Aortic knob calcifications are seen. Both lungs are clear. The visualized skeletal structures are unremarkable. IMPRESSION: No active disease.  Electronically Signed   By: Prudencio Pair M.D.   On: 09/29/2020 00:24     Assessment/Plan 74 year old male with a history of anxiety on Valium, alcohol use, presenting with a syncopal episode.  Found to be in A. fib  New onset rapid atrial fibrillation ([place]) Tachybradycardia syndrome -Patient presented with a syncopal episode, with rhythm strips on monitor showing paroxysmal rapid A. fib alternating with sinus bradycardia  -CHA2DS2-VASc of 2 on the basis of age -Cardiology consult.  Defer to cardiology for recommendations regarding anticoagulation -Holding rate control agents given fluctuating heart rate -Addendum: Following admission, patient noted to have sinus pauses up to 7 seconds.  Pacer pads placed, updated to stepdown.  Cardiology updated -Continuous cardiac monitoring, echocardiogram -IV hydration   Recurrent syncope -Suspect multifactorial related to A. fib, sinus bradycardia with pauses Valium for anxiety and ongoing alcohol use -Treat A. fib as above -Continuous cardiac monitoring, echocardiogram -Given urinary incontinence, will get EEG no syncope likely related to above -Neurology consult (can consider deferring based on new finding of sinus bradycardia with cardiac pauses, likely etiology)    Anxiety -Consider SSRI to replace anxiolytics for treatment  Alcohol use disorder, mild, abuse -EtOH withdrawal protocol -Counseled on abstinence    Elevated lactic acid level -Likely related to alcohol use.  No evidence of sepsis    DVT prophylaxis: Lovenox  Code Status: full code  Family Communication:  none  Disposition Plan: Back to previous home environment Consults called: Cardiology, neurology  Status:At the time of admission, it appears that the appropriate admission status for this patient is INPATIENT. This is judged to be reasonable and necessary in order to provide the required intensity of service to ensure the patient's safety given the presenting symptoms,  physical exam findings, and initial radiographic and laboratory data in the context of their  Comorbid conditions.   Patient requires inpatient status due to high intensity of service, high risk for further deterioration and high frequency of surveillance required.   I certify that at the point of admission it is my clinical judgment that the patient will require inpatient hospital care spanning beyond [place] MD [facility]     09/29/2020, 2:47 AM

## 2020-09-29 NOTE — Consult Note (Signed)
CARDIOLOGY CONSULT NOTE               Patient ID: Collin Knight MRN: [id number] DOB/AGE: 1946-02-19 74 y.o.  Admit date: 09/28/2020 Referring Physician Damita Dunnings Primary Physician Kary Kos (will be reestablishing care in 11/2020) Primary Cardiologist none per patient Reason for Consultation syncope, sinus pauses, new onset atrial fibrillation with RVR  HPI: 74 year old male referred for evaluation of syncope, sinus pauses, and new onset atrial fibrillation with RVR. The patient has a history of anxiety, alcohol abuse, and recurrent syncope/presyncope. The patient was reportedly in his normal state of health yesterday evening when he walked to the bathroom and had a syncopal episode upon entering the bathroom with no preceding chest pain, shortness of breath, palpitations, vision changes, lightheadedness, or dizziness. The patient states he has experienced syncopal and near syncopal episodes for 7 years, though typically has what he describes as a preceding panic attack, consisting of abdominal discomfort, nausea, palpitations, and dizziness. He is usually able to hold on to a nearby object or sit down when he feels the dizziness and does not usually pass out, or regains consciousness quickly. He was evaluated by a cardiologist in [facility] where he lived about 4 years ago, with no etiology found for his symptoms. The patient has been treated for anxiety for the last 4 years with Valium 5 mg BID and Zoloft. The patient also drinks 16-18 ounces of wine per day for anxiety, but recently switched to 3 beers per day. The patient had Valium at 7:30 that morning, had Zoloft in the afternoon, drank one beer in the evening, and had the syncopal episode around 6 PM. The patient reports a similar episode of syncope 10 days at home with no preceding symptoms. The patient was taken to [facility] ER via EMS and was noted to be in atrial fibrillation with rates in the 110s. Pertinent admission labs were BUN 10, creatinine 0.59,  high sensitivity troponin 7 and 8, lactic acid 2.9, followed by 1.1, BNP 223, hemoglobin 14, hematocrit 40.3, TSH 2.72. Chest xray negative for acute disease process. Initial ECG revealed sinus rhythm at a rate of 63 bpm. While on telemetry, the patient was noted to have a 7 second pause, though was asymptomatic, and pacer pads were placed. The patient has a chads vasc score of 2 (age). He denies a history of MI, stroke, heart failure, diabetes, or DVT/PE. At this time, the patient reports palpitations, denies dizziness, lightheadedness, chest pain, or shortness of breath.  Review of systems complete and found to be negative unless listed above     Past Medical History:  Diagnosis Date  . Anxiety     History reviewed. No pertinent surgical history.  (Not in a hospital admission)  Social History   Socioeconomic History  . Marital status: Married    Spouse name: Not on file  . Number of children: Not on file  . Years of education: Not on file  . Highest education level: Not on file  Occupational History  . Not on file  Tobacco Use  . Smoking status: Never Smoker  . Smokeless tobacco: Never Used  Substance and Sexual Activity  . Alcohol use: Yes    Alcohol/week: 21.0 standard drinks    Types: 21 Cans of beer per week    Comment: 3 beers a day  . Drug use: Never  . Sexual activity: Yes  Other Topics Concern  . Not on file  Social History Narrative  . Not on file  Social Determinants of Health   Financial Resource Strain: Not on file  Food Insecurity: Not on file  Transportation Needs: Not on file  Physical Activity: Not on file  Stress: Not on file  Social Connections: Not on file  Intimate Partner Violence: Not on file    History reviewed. No pertinent family history.    Review of systems complete and found to be negative unless listed above      PHYSICAL EXAM  General: Well developed, well nourished, in no acute distress, lying in bed HEENT:  Right  periorbital bruising  Neck:  No JVD.  Lungs: Clear bilaterally to auscultation, normal effort of breathing on room air. Heart: irregularly irregular, without gallops or murmurs.  Abdomen: nondistended Msk:  No obvious deformities, gait not assessed Extremities: No clubbing, cyanosis or edema.   Neuro: Alert and oriented X 3. Psych:  Good affect, responds appropriately  Labs:   Lab Results  Component Value Date   WBC 8.5 09/28/2020   HGB 14.0 09/28/2020   HCT 40.3 09/28/2020   MCV 102.0 (H) 09/28/2020   PLT 244 09/28/2020    Recent Labs  Lab 09/28/20 2246 09/29/20 0701  NA 138  --   K 4.3  --   CL 102  --   CO2 26  --   BUN 10  --   CREATININE 0.59* 0.55*  CALCIUM 7.8*  --   PROT 6.5  --   BILITOT 0.8  --   ALKPHOS 49  --   ALT 44  --   AST 76*  --   GLUCOSE 86  --    No results found for: CKTOTAL, CKMB, CKMBINDEX, TROPONINI No results found for: CHOL No results found for: HDL No results found for: LDLCALC No results found for: TRIG No results found for: CHOLHDL No results found for: LDLDIRECT    Radiology: CT Head Wo Contrast  Result Date: 09/28/2020 CLINICAL DATA:  Loss of consciousness EXAM: CT HEAD WITHOUT CONTRAST TECHNIQUE: Contiguous axial images were obtained from the base of the skull through the vertex without intravenous contrast. COMPARISON:  None. FINDINGS: Brain: No evidence of acute territorial infarction, hemorrhage, hydrocephalus,extra-axial collection or mass lesion/mass effect. There is dilatation the ventricles and sulci consistent with age-related atrophy. Low-attenuation changes in the deep white matter consistent with small vessel ischemia. Vascular: No hyperdense vessel or unexpected calcification. Skull: The skull is intact. No fracture or focal lesion identified. Sinuses/Orbits: The visualized paranasal sinuses and mastoid air cells are clear. The orbits and globes intact. Other: None Cervical spine: Alignment: Physiologic Skull base and  vertebrae: Visualized skull base is intact. No atlanto-occipital dissociation. The vertebral body heights are well maintained. No fracture or pathologic osseous lesion seen. Soft tissues and spinal canal: The visualized paraspinal soft tissues are unremarkable. No prevertebral soft tissue swelling is seen. The spinal canal is grossly unremarkable, no large epidural collection or significant canal narrowing. Disc levels: Multilevel cervical spine spondylosis is seen with disc osteophyte complex and uncovertebral osteophytes most notable at C5-C6 with severe bilateral neural foraminal narrowing and moderate central canal stenosis. Upper chest: Paraseptal emphysematous changes seen at both lung apices. Thoracic inlet is within normal limits. Other: None IMPRESSION: No acute intracranial abnormality. Findings consistent with age related atrophy and chronic small vessel ischemia No acute fracture or malalignment of the spine. Electronically Signed   By: Prudencio Pair M.D.   On: 09/28/2020 22:21   CT Cervical Spine Wo Contrast  Result Date: 09/28/2020 CLINICAL DATA:  Loss  of consciousness EXAM: CT HEAD WITHOUT CONTRAST TECHNIQUE: Contiguous axial images were obtained from the base of the skull through the vertex without intravenous contrast. COMPARISON:  None. FINDINGS: Brain: No evidence of acute territorial infarction, hemorrhage, hydrocephalus,extra-axial collection or mass lesion/mass effect. There is dilatation the ventricles and sulci consistent with age-related atrophy. Low-attenuation changes in the deep white matter consistent with small vessel ischemia. Vascular: No hyperdense vessel or unexpected calcification. Skull: The skull is intact. No fracture or focal lesion identified. Sinuses/Orbits: The visualized paranasal sinuses and mastoid air cells are clear. The orbits and globes intact. Other: None Cervical spine: Alignment: Physiologic Skull base and vertebrae: Visualized skull base is intact. No  atlanto-occipital dissociation. The vertebral body heights are well maintained. No fracture or pathologic osseous lesion seen. Soft tissues and spinal canal: The visualized paraspinal soft tissues are unremarkable. No prevertebral soft tissue swelling is seen. The spinal canal is grossly unremarkable, no large epidural collection or significant canal narrowing. Disc levels: Multilevel cervical spine spondylosis is seen with disc osteophyte complex and uncovertebral osteophytes most notable at C5-C6 with severe bilateral neural foraminal narrowing and moderate central canal stenosis. Upper chest: Paraseptal emphysematous changes seen at both lung apices. Thoracic inlet is within normal limits. Other: None IMPRESSION: No acute intracranial abnormality. Findings consistent with age related atrophy and chronic small vessel ischemia No acute fracture or malalignment of the spine. Electronically Signed   By: Prudencio Pair M.D.   On: 09/28/2020 22:21   DG Chest Portable 1 View  Result Date: 09/29/2020 CLINICAL DATA:  Syncope EXAM: PORTABLE CHEST 1 VIEW COMPARISON:  None. FINDINGS: The heart size and mediastinal contours are mildly enlarged. Aortic knob calcifications are seen. Both lungs are clear. The visualized skeletal structures are unremarkable. IMPRESSION: No active disease. Electronically Signed   By: Prudencio Pair M.D.   On: 09/29/2020 00:24    EKG: sinus rhythm, 63 bpm; telemetry shows atrial fibrillation with rates 90s-140s.  ASSESSMENT AND PLAN:  1. New onset atrial fibrillation, with a chads vasc score of 2, with rates in the 90s-140s, with a history of alcohol abuse. Patient has had syncopal and near syncopal episodes with preceding "panic attacks" consisting of palpitations, nausea, abdominal pain, and nausea for 7 years, though atrial fibrillation was not previously detected.  2. Sinus pause and intermittent bradycardia, noted on telemetry, patient asymptomatic at the time, no recurrence. Pacer pads  placed. 3. Syncope, as per above, patient has a long history of recurrent episodes, though typically with preceding symptoms. This episode and an episode 10 days ago were sudden with no prodromal symptoms. Possibly secondary to atrial fibrillation and/or pauses 4. Alcohol abuse, drinks 16-18 ounces of wine or 3 beers per day. 5. Anxiety, takes Valium 5 mg BID and Zoloft daily  Recommendations: 1. Continue to monitor on telemetry 2. Defer temporary or permanent pacemaker at this time 3. Defer rate controlling medications at this time 4. Initiate Eliquis 5 mg BID for stroke prevention 5. Review 2D echocardiogram 6. Advised patient to avoid/limit alcohol consumption 7. Further recommendations pending patient's initial course  Discussed with Dr. Saralyn Pilar who agrees with the above plan.  Signed: Clabe Seal PA-C 09/29/2020, 8:25 AM

## 2020-09-29 NOTE — Procedures (Incomplete)
Patient Name: Collin Knight  MRN: [id number]  Epilepsy Attending: Lora Havens  Referring Physician/Provider: Tiana Loft Date: 09/29/2020 Duration:   Patient history: 74 year old male with a history of anxiety on Valium, alcohol use, presenting with a syncopal episode. EEG to evaluate for seizure  Level of alertness: Awake, drowsy, sleep, comatose, lethargic  AEDs during EEG study: None  Technical aspects: This EEG study was done with scalp electrodes positioned according to the 10-20 International system of electrode placement. Electrical activity was acquired at a sampling rate of 500Hz  and reviewed with a high frequency filter of 70Hz  and a low frequency filter of 1Hz . EEG data were recorded continuously and digitally stored.   Description: The posterior dominant rhythm consists of 9-10 Hz activity of moderate voltage (25-35 uV) seen predominantly in posterior head regions, symmetric and reactive to eye opening and eye closing. Drowsiness was characterized by attenuation of the posterior background rhythm. Sleep was characterized by vertex waves, sleep spindles (12 to 14 Hz), maximal frontocentral region.  There is an excessive amount of 15 to 18 Hz, 2-3 uV beta activity with irregular morphology distributed symmetrically and diffusely.   EEG showed continuous/intermittent generalized 3 to 6 Hz theta-delta slowing.  Hyperventilation did not show any EEG change.  Physiology photic driving was not seen during photic stimulation.  Hyperventilation and photic stimulation were not performed.     ABNORMALITY -Excessive beta, generalized -Intermittent slow, generalized -Continued slow, generalized -Triphasic waves, generalized -Sharp wave, -Spike, -Periodic epileptiform discharges, generalized/lateralized   IMPRESSION: This study is within normal limits.  The study suggestive of mild/moderate/severe diffuse encephalopathy, nonspecific etiology but likely related to sedation,  toxic-metabolic etiology, anoxic/hypoxic brain injury This study showed evidence of potential epileptogenicity arising from The study showed seizures arising from No seizures or epileptiform discharges were seen throughout the recording. However, only wakefulness and drowsiness were recorded. If suspicion for interictal activity remains a concern, a prolonged study including sleep should be considered.  The excessive beta activity seen in the background is most likely due to the effect of benzodiazepine and is a benign EEG pattern.   Willoughby Doell Barbra Sarks

## 2020-09-29 NOTE — TOC Initial Note (Signed)
Transition of Care [facility]) - Initial/Assessment Note    Patient Details  Name: Collin Knight MRN: [id number] Date of Birth: 01-18-1946  Transition of Care [facility]) CM/SW Contact:    Ova Freshwater Phone Number: [phone] 09/29/2020, 2:35 PM  Clinical Narrative:                  Patient was with ultrasound tech when CSW went to assess patient.       Patient Goals and CMS Choice        Expected Discharge Plan and Services                                                Prior Living Arrangements/Services                       Activities of Daily Living      Permission Sought/Granted                  Emotional Assessment              Admission diagnosis:  Rapid atrial fibrillation [facility]) [I48.91] Patient Active Problem List   Diagnosis Date Noted  . Rapid atrial fibrillation ([place]) 09/29/2020  . Recurrent syncope 09/29/2020  . Anxiety 09/29/2020  . Alcohol use disorder, mild, abuse 09/29/2020  . Elevated lactic acid level 09/29/2020   PCP:  [name], MD Pharmacy:  No Pharmacies Listed    Social Determinants of Health (SDOH) Interventions    Readmission Risk Interventions No flowsheet data found.

## 2020-09-29 NOTE — Progress Notes (Signed)
*  PRELIMINARY RESULTS* Echocardiogram 2D Echocardiogram has been performed.  Collin Knight [name] 09/29/2020, 12:30 PM

## 2020-09-29 NOTE — ED Notes (Signed)
Pt had episode of profound bradycardia. Pt reports palpitations during that time. Admit team notified.

## 2020-09-30 ENCOUNTER — Encounter
Admission: EM | Disposition: A | Discharge status: Home Health | Payer: Self-pay | Source: Home / Self Care | Attending: Family Medicine

## 2020-09-30 DIAGNOSIS — Z950 Presence of cardiac pacemaker: Secondary | ICD-10-CM

## 2020-09-30 HISTORY — PX: PACEMAKER LEADLESS INSERTION: EP1219

## 2020-09-30 HISTORY — DX: Presence of cardiac pacemaker: Z95.0

## 2020-09-30 LAB — CBC WITH DIFFERENTIAL/PLATELET
Abs Immature Granulocytes: 0.03 10*3/uL (ref 0.00–0.07)
Basophils Absolute: 0 10*3/uL (ref 0.0–0.1)
Basophils Relative: 0 %
Eosinophils Absolute: 0.2 10*3/uL (ref 0.0–0.5)
Eosinophils Relative: 3 %
HCT: 40.4 % (ref 39.0–52.0)
Hemoglobin: 13.8 g/dL (ref 13.0–17.0)
Immature Granulocytes: 0 %
Lymphocytes Relative: 22 %
Lymphs Abs: 1.5 10*3/uL (ref 0.7–4.0)
MCH: 34.8 pg — ABNORMAL HIGH (ref 26.0–34.0)
MCHC: 34.2 g/dL (ref 30.0–36.0)
MCV: 102 fL — ABNORMAL HIGH (ref 80.0–100.0)
Monocytes Absolute: 1 10*3/uL (ref 0.1–1.0)
Monocytes Relative: 15 %
Neutro Abs: 4 10*3/uL (ref 1.7–7.7)
Neutrophils Relative %: 60 %
Platelets: 244 10*3/uL (ref 150–400)
RBC: 3.96 MIL/uL — ABNORMAL LOW (ref 4.22–5.81)
RDW: 13.2 % (ref 11.5–15.5)
WBC: 6.7 10*3/uL (ref 4.0–10.5)
nRBC: 0 % (ref 0.0–0.2)

## 2020-09-30 LAB — BASIC METABOLIC PANEL
Anion gap: 9 (ref 5–15)
BUN: 10 mg/dL (ref 8–23)
CO2: 22 mmol/L (ref 22–32)
Calcium: 8.3 mg/dL — ABNORMAL LOW (ref 8.9–10.3)
Chloride: 106 mmol/L (ref 98–111)
Creatinine, Ser: 0.61 mg/dL (ref 0.61–1.24)
GFR, Estimated: >60 mL/min (ref >60)
Glucose, Bld: 88 mg/dL (ref 70–99)
Potassium: 3.6 mmol/L (ref 3.5–5.1)
Sodium: 137 mmol/L (ref 135–145)

## 2020-09-30 LAB — CBG MONITORING, ED: Glucose-Capillary: 81 mg/dL (ref 70–99)

## 2020-09-30 SURGERY — PACEMAKER LEADLESS INSERTION
Anesthesia: Moderate Sedation

## 2020-09-30 MED ORDER — FENTANYL CITRATE (PF) 100 MCG/2ML IJ SOLN
INTRAMUSCULAR | Status: AC
  Filled 2020-09-30: qty 2

## 2020-09-30 MED ORDER — IOHEXOL 300 MG/ML  SOLN
INTRAMUSCULAR | Status: DC | PRN
  Administered 2020-09-30: 9 mL

## 2020-09-30 MED ORDER — FENTANYL CITRATE (PF) 100 MCG/2ML IJ SOLN
INTRAMUSCULAR | Status: DC | PRN
  Administered 2020-09-30: 50 ug via INTRAVENOUS

## 2020-09-30 MED ORDER — SODIUM CHLORIDE 0.45 % IV SOLN: INTRAVENOUS | Status: DC

## 2020-09-30 MED ORDER — HEPARIN SODIUM (PORCINE) 1000 UNIT/ML IJ SOLN
INTRAMUSCULAR | Status: AC
  Filled 2020-09-30: qty 1

## 2020-09-30 MED ORDER — CHLORHEXIDINE GLUCONATE CLOTH 2 % EX PADS
6.0000 | MEDICATED_PAD | Freq: Every day | CUTANEOUS | Status: DC
  Filled 2020-09-30: qty 6

## 2020-09-30 MED ORDER — ACETAMINOPHEN 325 MG PO TABS
650.0000 mg | ORAL_TABLET | ORAL | Status: DC | PRN
  Administered 2020-10-01: 650 mg via ORAL

## 2020-09-30 MED ORDER — HEPARIN (PORCINE) IN NACL 2000-0.9 UNIT/L-% IV SOLN
INTRAVENOUS | Status: DC | PRN
  Administered 2020-09-30: 1000 mL

## 2020-09-30 MED ORDER — MIDAZOLAM HCL 2 MG/2ML IJ SOLN
INTRAMUSCULAR | Status: AC
  Filled 2020-09-30: qty 2

## 2020-09-30 MED ORDER — SODIUM CHLORIDE 0.9 % IV SOLN: INTRAVENOUS | Status: DC

## 2020-09-30 MED ORDER — ONDANSETRON HCL 4 MG/2ML IJ SOLN: 4.0000 mg | Freq: Four times a day (QID) | INTRAMUSCULAR | Status: DC | PRN

## 2020-09-30 MED ORDER — SODIUM CHLORIDE 0.9 % IV SOLN: 250.0000 mL | INTRAVENOUS | Status: DC | PRN

## 2020-09-30 MED ORDER — HEPARIN SODIUM (PORCINE) 1000 UNIT/ML IJ SOLN
INTRAMUSCULAR | Status: DC | PRN
  Administered 2020-09-30: 4000 [IU] via INTRAVENOUS

## 2020-09-30 MED ORDER — CEFAZOLIN SODIUM-DEXTROSE 2-4 GM/100ML-% IV SOLN
2.0000 g | INTRAVENOUS | Status: DC
  Filled 2020-09-30: qty 100

## 2020-09-30 MED ORDER — MIDAZOLAM HCL 2 MG/2ML IJ SOLN
INTRAMUSCULAR | Status: DC | PRN
  Administered 2020-09-30: 1 mg via INTRAVENOUS

## 2020-09-30 MED ORDER — LIDOCAINE HCL (PF) 1 % IJ SOLN
INTRAMUSCULAR | Status: AC
  Filled 2020-09-30: qty 30

## 2020-09-30 MED ORDER — SODIUM CHLORIDE 0.9% FLUSH
3.0000 mL | Freq: Two times a day (BID) | INTRAVENOUS | Status: DC
  Administered 2020-09-30: 3 mL via INTRAVENOUS

## 2020-09-30 MED ORDER — SODIUM CHLORIDE 0.9% FLUSH: 3.0000 mL | INTRAVENOUS | Status: DC | PRN

## 2020-09-30 SURGICAL SUPPLY — 13 items
DILATOR VESSEL 38 20CM 12FR (INTRODUCER) ×3 IMPLANT
DILATOR VESSEL 38 20CM 14FR (INTRODUCER) ×3 IMPLANT
DILATOR VESSEL 38 20CM 18FR (INTRODUCER) ×3 IMPLANT
DILATOR VESSEL 38 20CM 8FR (INTRODUCER) ×3 IMPLANT
MICRA AV TRANSCATH PACING SYS (Pacemaker) ×3 IMPLANT
MICRA INTRODUCER SHEATH (SHEATH) ×3
NEEDLE PERC 18GX7CM (NEEDLE) ×3 IMPLANT
PACK CARDIAC CATH (CUSTOM PROCEDURE TRAY) ×3 IMPLANT
SHEATH BRITE TIP 7FRX11 (SHEATH) ×3 IMPLANT
SHEATH INTRODUCER MICRA (SHEATH) ×1 IMPLANT
SYSTEM PACING TRNSCTH AV MICRA (Pacemaker) ×1 IMPLANT
WIRE AMPLATZ SS-J .035X180CM (WIRE) ×3 IMPLANT
WIRE GUIDERIGHT .035X150 (WIRE) ×3 IMPLANT

## 2020-09-30 NOTE — Progress Notes (Signed)
Dr. Saralyn Pilar spoke to patient and patient's wife:  Collin Knight in recovery. Reviewed procedure, will be able to go home tomorrow. Patient without c/o's Tolerated sips of water without event

## 2020-09-30 NOTE — Progress Notes (Signed)
Report given to Ashley RN

## 2020-09-30 NOTE — ED Notes (Signed)
Pt noted to have asystolic event on monitor at 0057. This RN went to assess pt and pt was alert states he has having minor chest pain. Provider made aware, no intervention given at this time.
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
## 2020-09-30 NOTE — Progress Notes (Signed)
PROGRESS NOTE    Collin Knight  BOF:[id number] DOB: Mar 23, 1946 DOA: 09/28/2020 PCP: [name], MD   Brief Narrative:  HPI: Collin Knight is a 74 y.o. male with medical history significant for anxiety, alcohol use, who presents to the emergency room after an episode of blacking out while in the bathroom.  He has no recollection of the incident but appeared to have hit his head.  He was back to baseline by arrival to the emergency room.  States he has a history of recurrent dizzy spells and falls but this has been the worst one yet.  Patient admits to drinking about 3 beers daily.  Prior to the episode he was in his usual state of health.  Denies preceding headache, visual disturbance, chest pain, shortness of breath or palpitations, lightheadedness or one-sided numbness weakness or tingling.  Was incontinent of urine while he was out.  EMS reported heart rate 100-150 A. Fib.  Patient reportedly took 2 Valium with some alcohol earlier in the day per report. ED Course: On arrival afebrile, BP 141/71 heart rate 114 with A. fib on monitor.  O2 sat 100% on room air.  While in the ER patient noted to be flipping in and out of A. fib with RVR with rates up to 130 alternating with sinus bradycardia rate in the 50s.  On blood work, troponin was 8, BNP 223, lactic acid 2.9.  TSH 2.7.  Other blood work unremarkable. EKG as reviewed by me : Sinus rhythm at 62 Imaging: Chest x-ray clear.  CT head and C-spine without evidence of acute trauma Patient hydrated in the ER.  Hospitalist consulted for admission.  While awaiting admission, patient had sinus pauses, asymptomatic, duration up to 7 seconds and paced pads placed  Assessment & Plan:   Principal Problem:   Rapid atrial fibrillation (HCC) Active Problems:   Recurrent syncope   Anxiety   Alcohol use disorder, mild, abuse   Elevated lactic acid level   New onset atrial fibrillation with RVR/tachybradycardia syndrome: Patient was detected to have new  onset atrial fibrillation with RVR and then had few pauses with the longest being 7-second.  Has been having tachycardia and bradycardia, pauses and even asystole.  Cardiology has been consulted.  He has been started on Eliquis 5 mg p.o. twice daily.  Echo shows normal ejection fraction with no wall motion abnormality.  Reportedly, plan per cardiology is to go ahead with permanent pacemaker.   Recurrent syncope: Per patient, he has had approximately 6-7 episodes of passing out in last 17 months and never sought any medical attention for that.  This is the first time he presented to ED.  His syncope is likely cardiogenic.  CT head negative for any acute pathology.  EEG is pending.  Monitor for now.  Anxiety with history of panic attacks: Consider SSRI to replace anxiolytics for treatment at the time of discharge.  Alcohol use disorder, mild, abuse: No signs of withdrawal.  Continue CIWA protocol.  Lactic acidosis: Likely due to dehydration.  Resolved.  Monitor.  Right shoulder pain: X-ray negative for any fracture.  Likely contusion due to fall.  DVT prophylaxis:    Code Status: Full Code  Family Communication: None present at bedside.  Plan of care discussed with patient in length with him and he verbalized understanding and agreed with it.  Status is: Inpatient  Remains inpatient appropriate because:Inpatient level of care appropriate due to severity of illness   Dispo: The patient is from: Home  Anticipated d/c is to: Home              Anticipated d/c date is: 1 day              Patient currently is not medically stable to d/c.        Estimated body mass index is 20.37 kg/m as calculated from the following:   Height as of this encounter: 5' 10.5" (1.791 m).   Weight as of this encounter: 65.3 kg.      Nutritional status:               Consultants:   Cardiology  Procedures:   None  Antimicrobials:  Anti-infectives (From admission, onward)    None         Subjective: Patient seen and examined.  Is still waiting for the bed in the ED.  Only complaint again today is right shoulder pain which is improving.  Denied any palpitation, chest pain or shortness of breath or any other complaint.  Tells me that he did not have any symptoms even when he had asystole or pauses.  Objective: Vitals:   09/30/20 0930 09/30/20 0945 09/30/20 1000 09/30/20 1030  BP: 130/88 130/88 (!) 131/91 120/81  Pulse: (!) 55 (!) 55 (!) 56 (!) 59  Resp: 15  17 13   Temp:      TempSrc:      SpO2: 96%  95% 97%  Weight:      Height:       No intake or output data in the 24 hours ending 09/30/20 1121 Filed Weights   09/28/20 2159 09/29/20 0739  Weight: 65.3 kg 65.3 kg    Examination:  General exam: Appears calm and comfortable, laceration over right eyebrow. Respiratory system: Clear to auscultation. Respiratory effort normal. Cardiovascular system: S1 & S2 heard, irregularly irregular and bradycardiac. No JVD, murmurs, rubs, gallops or clicks. No pedal edema. Gastrointestinal system: Abdomen is nondistended, soft and nontender. No organomegaly or masses felt. Normal bowel sounds heard. Central nervous system: Alert and oriented. No focal neurological deficits. Extremities: Symmetric 5 x 5 power. Skin: No rashes, lesions or ulcers.  Psychiatry: Judgement and insight appear normal. Mood & affect appropriate.    Data Reviewed: I have personally reviewed following labs and imaging studies  CBC: Recent Labs  Lab 09/28/20 2246 09/30/20 0421  WBC 8.5 6.7  NEUTROABS 6.5 4.0  HGB 14.0 13.8  HCT 40.3 40.4  MCV 102.0* 102.0*  PLT 244 562   Basic Metabolic Panel: Recent Labs  Lab 09/28/20 2246 09/29/20 0701 09/29/20 1440 09/30/20 0421  NA 138  --  139 137  K 4.3  --  3.8 3.6  CL 102  --  107 106  CO2 26  --  22 22  GLUCOSE 86  --  93 88  BUN 10  --  10 10  CREATININE 0.59* 0.55* 0.66 0.61  CALCIUM 7.8*  --  8.6* 8.3*  MG  --  1.7  --    --   PHOS  --  3.3  --   --    GFR: Estimated Creatinine Clearance: 72.6 mL/min (by C-G formula based on SCr of 0.61 mg/dL). Liver Function Tests: Recent Labs  Lab 09/28/20 2246  AST 76*  ALT 44  ALKPHOS 49  BILITOT 0.8  PROT 6.5  ALBUMIN 3.7   No results for input(s): LIPASE, AMYLASE in the last 168 hours. No results for input(s): AMMONIA in the last 168 hours. Coagulation Profile: No  results for input(s): INR, PROTIME in the last 168 hours. Cardiac Enzymes: No results for input(s): CKTOTAL, CKMB, CKMBINDEX, TROPONINI in the last 168 hours. BNP (last 3 results) No results for input(s): PROBNP in the last 8760 hours. HbA1C: No results for input(s): HGBA1C in the last 72 hours. CBG: Recent Labs  Lab 09/29/20 0700 09/30/20 0435  GLUCAP 95 81   Lipid Profile: No results for input(s): CHOL, HDL, LDLCALC, TRIG, CHOLHDL, LDLDIRECT in the last 72 hours. Thyroid Function Tests: Recent Labs    09/28/20 2246  TSH 2.721   Anemia Panel: No results for input(s): VITAMINB12, FOLATE, FERRITIN, TIBC, IRON, RETICCTPCT in the last 72 hours. Sepsis Labs: Recent Labs  Lab 09/28/20 2246 09/29/20 0702  LATICACIDVEN 2.9* 1.1    Recent Results (from the past 240 hour(s))  Resp Panel by RT-PCR (Flu A&B, Covid) Nasopharyngeal Swab     Status: None   Collection Time: 09/29/20  7:01 AM   Specimen: Nasopharyngeal Swab; Nasopharyngeal(NP) swabs in vial transport medium  Result Value Ref Range Status   SARS Coronavirus 2 by RT PCR NEGATIVE NEGATIVE Final    Comment: (NOTE) SARS-CoV-2 target nucleic acids are NOT DETECTED.  The SARS-CoV-2 RNA is generally detectable in upper respiratory specimens during the acute phase of infection. The lowest concentration of SARS-CoV-2 viral copies this assay can detect is 138 copies/mL. A negative result does not preclude SARS-Cov-2 infection and should not be used as the sole basis for treatment or other patient management decisions. A negative  result may occur with  improper specimen collection/handling, submission of specimen other than nasopharyngeal swab, presence of viral mutation(s) within the areas targeted by this assay, and inadequate number of viral copies(<138 copies/mL). A negative result must be combined with clinical observations, patient history, and epidemiological information. The expected result is Negative.  Fact Sheet for Patients:  EntrepreneurPulse.com.au  Fact Sheet for Healthcare Providers:  IncredibleEmployment.be  This test is no t yet approved or cleared by the [place] FDA and  has been authorized for detection and/or diagnosis of SARS-CoV-2 by FDA under an Emergency Use Authorization (EUA). This EUA will remain  in effect (meaning this test can be used) for the duration of the COVID-19 declaration under Section 564(b)(1) of the Act, 21 U.S.C.section 360bbb-3(b)(1), unless the authorization is terminated  or revoked sooner.       Influenza A by PCR NEGATIVE NEGATIVE Final   Influenza B by PCR NEGATIVE NEGATIVE Final    Comment: (NOTE) The Xpert Xpress SARS-CoV-2/FLU/RSV plus assay is intended as an aid in the diagnosis of influenza from Nasopharyngeal swab specimens and should not be used as a sole basis for treatment. Nasal washings and aspirates are unacceptable for Xpert Xpress SARS-CoV-2/FLU/RSV testing.  Fact Sheet for Patients: EntrepreneurPulse.com.au  Fact Sheet for Healthcare Providers: IncredibleEmployment.be  This test is not yet approved or cleared by the [place] FDA and has been authorized for detection and/or diagnosis of SARS-CoV-2 by FDA under an Emergency Use Authorization (EUA). This EUA will remain in effect (meaning this test can be used) for the duration of the COVID-19 declaration under Section 564(b)(1) of the Act, 21 U.S.C. section 360bbb-3(b)(1), unless the authorization is  terminated or revoked.  Performed at [facility], [address] [place], [place] [address]       Radiology Studies: DG Shoulder Right  Result Date: 09/29/2020 CLINICAL DATA:  Right shoulder pain after fall yesterday. EXAM: RIGHT SHOULDER - 2+ VIEW COMPARISON:  None. FINDINGS: There is no  evidence of fracture or dislocation. Severe degenerative changes seen involving the right acromioclavicular joint. Soft tissues are unremarkable. 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 No acute abnormality seen. Electronically Signed   By: Marijo Conception M.D.   On: 09/29/2020 12:23   CT Head Wo Contrast  Result Date: 09/28/2020 CLINICAL DATA:  Loss of consciousness EXAM: CT HEAD WITHOUT CONTRAST TECHNIQUE: Contiguous axial images were obtained from the base of the skull through the vertex without intravenous contrast. COMPARISON:  None. FINDINGS: Brain: No evidence of acute territorial infarction, hemorrhage, hydrocephalus,extra-axial collection or mass lesion/mass effect. There is dilatation the ventricles and sulci consistent with age-related atrophy. Low-attenuation changes in the deep white matter consistent with small vessel ischemia. Vascular: No hyperdense vessel or unexpected calcification. Skull: The skull is intact. No fracture or focal lesion identified. Sinuses/Orbits: The visualized paranasal sinuses and mastoid air cells are clear. The orbits and globes intact. Other: None Cervical spine: Alignment: Physiologic Skull base and vertebrae: Visualized skull base is intact. No atlanto-occipital dissociation. The vertebral body heights are well maintained. No fracture or pathologic osseous lesion seen. Soft tissues and spinal canal: The visualized paraspinal soft tissues are unremarkable. No prevertebral soft tissue swelling is seen. The spinal canal is grossly unremarkable, no large epidural collection or significant canal narrowing. Disc levels:  Multilevel cervical spine spondylosis is seen with disc osteophyte complex and uncovertebral osteophytes most notable at C5-C6 with severe bilateral neural foraminal narrowing and moderate central canal stenosis. Upper chest: Paraseptal emphysematous changes seen at both lung apices. Thoracic inlet is within normal limits. Other: None IMPRESSION: No acute intracranial abnormality. Findings consistent with age related atrophy and chronic small vessel ischemia No acute fracture or malalignment of the spine. Electronically Signed   By: Prudencio Pair M.D.   On: 09/28/2020 22:21   CT Cervical Spine Wo Contrast  Result Date: 09/28/2020 CLINICAL DATA:  Loss of consciousness EXAM: CT HEAD WITHOUT CONTRAST TECHNIQUE: Contiguous axial images were obtained from the base of the skull through the vertex without intravenous contrast. COMPARISON:  None. FINDINGS: Brain: No evidence of acute territorial infarction, hemorrhage, hydrocephalus,extra-axial collection or mass lesion/mass effect. There is dilatation the ventricles and sulci consistent with age-related atrophy. Low-attenuation changes in the deep white matter consistent with small vessel ischemia. Vascular: No hyperdense vessel or unexpected calcification. Skull: The skull is intact. No fracture or focal lesion identified. Sinuses/Orbits: The visualized paranasal sinuses and mastoid air cells are clear. The orbits and globes intact. Other: None Cervical spine: Alignment: Physiologic Skull base and vertebrae: Visualized skull base is intact. No atlanto-occipital dissociation. The vertebral body heights are well maintained. No fracture or pathologic osseous lesion seen. Soft tissues and spinal canal: The visualized paraspinal soft tissues are unremarkable. No prevertebral soft tissue swelling is seen. The spinal canal is grossly unremarkable, no large epidural collection or significant canal narrowing. Disc levels: Multilevel cervical spine spondylosis is seen with disc  osteophyte complex and uncovertebral osteophytes most notable at C5-C6 with severe bilateral neural foraminal narrowing and moderate central canal stenosis. Upper chest: Paraseptal emphysematous changes seen at both lung apices. Thoracic inlet is within normal limits. Other: None IMPRESSION: No acute intracranial abnormality. Findings consistent with age related atrophy and chronic small vessel ischemia No acute fracture or malalignment of the spine. Electronically Signed   By: Prudencio Pair M.D.   On: 09/28/2020 22:21   DG Chest Portable 1 View  Result Date: 09/29/2020 CLINICAL DATA:  Syncope EXAM: PORTABLE CHEST 1 VIEW  COMPARISON:  None. FINDINGS: The heart size and mediastinal contours are mildly enlarged. Aortic knob calcifications are seen. Both lungs are clear. The visualized skeletal structures are unremarkable. IMPRESSION: No active disease. Electronically Signed   By: Prudencio Pair M.D.   On: 09/29/2020 00:24   ECHOCARDIOGRAM COMPLETE  Result Date: 09/29/2020    ECHOCARDIOGRAM REPORT   Patient Name:   Stefanos Hout Date of Exam: 09/29/2020 Medical Rec #:  [id number]  Height:       70.5 in Accession #:    [id number] Weight:       144.0 lb Date of Birth:  28-Apr-1946   BSA:          1.825 m Patient Age:    18 years   BP:           111/91 mmHg Patient Gender: M          HR:           107 bpm. Exam Location:  [facility] Procedure: 2D Echo, Color Doppler and Cardiac Doppler Indications:     I48.91 Atrial Fibrillation  History:         Patient has no prior history of Echocardiogram examinations.                  Signs/Symptoms:Syncope.  Sonographer:     Charmayne Sheer RDCS (AE) Referring Phys:  [id number] Athena Masse Diagnosing Phys: Isaias Cowman MD  Sonographer Comments: Suboptimal subcostal window. IMPRESSIONS  1. Left ventricular ejection fraction, by estimation, is 50 to 55%. The left ventricle has low normal function. The left ventricle has no regional wall motion abnormalities. Left ventricular diastolic  parameters are indeterminate.  2. Right ventricular systolic function is normal. The right ventricular size is normal.  3. The mitral valve is normal in structure. Mild mitral valve regurgitation. No evidence of mitral stenosis.  4. The aortic valve is normal in structure. Aortic valve regurgitation is not visualized. No aortic stenosis is present.  5. The inferior vena cava is normal in size with greater than 50% respiratory variability, suggesting right atrial pressure of 3 mmHg. FINDINGS  Left Ventricle: Left ventricular ejection fraction, by estimation, is 50 to 55%. The left ventricle has low normal function. The left ventricle has no regional wall motion abnormalities. The left ventricular internal cavity size was normal in size. There is no left ventricular hypertrophy. Left ventricular diastolic parameters are indeterminate. Right Ventricle: The right ventricular size is normal. No increase in right ventricular wall thickness. Right ventricular systolic function is normal. Left Atrium: Left atrial size was normal in size. Right Atrium: Right atrial size was normal in size. Pericardium: There is no evidence of pericardial effusion. Mitral Valve: The mitral valve is normal in structure. Mild mitral valve regurgitation. No evidence of mitral valve stenosis. MV peak gradient, 4.0 mmHg. The mean mitral valve gradient is 2.0 mmHg. Tricuspid Valve: The tricuspid valve is normal in structure. Tricuspid valve regurgitation is mild . No evidence of tricuspid stenosis. Aortic Valve: The aortic valve is normal in structure. Aortic valve regurgitation is not visualized. No aortic stenosis is present. Aortic valve mean gradient measures 5.0 mmHg. Aortic valve peak gradient measures 8.5 mmHg. Aortic valve area, by VTI measures 2.35 cm. Pulmonic Valve: The pulmonic valve was normal in structure. Pulmonic valve regurgitation is not visualized. No evidence of pulmonic stenosis. Aorta: The aortic root is normal in size and  structure. Venous: The inferior vena cava is normal in size with greater than 50% respiratory  variability, suggesting right atrial pressure of 3 mmHg. IAS/Shunts: No atrial level shunt detected by color flow Doppler.  LEFT VENTRICLE PLAX 2D LVIDd:         5.21 cm  Diastology LVIDs:         3.88 cm  LV e' medial:    8.27 cm/s LV PW:         1.05 cm  LV E/e' medial:  11.2 LV IVS:        0.84 cm  LV e' lateral:   14.40 cm/s LVOT diam:     2.20 cm  LV E/e' lateral: 6.5 LV SV:         54 LV SV Index:   30 LVOT Area:     3.80 cm  RIGHT VENTRICLE RV Basal diam:  2.79 cm LEFT ATRIUM             Index       RIGHT ATRIUM           Index LA diam:        3.70 cm 2.03 cm/m  RA Area:     14.40 cm LA Vol (A2C):   39.7 ml 21.75 ml/m RA Volume:   34.40 ml  18.85 ml/m LA Vol (A4C):   34.2 ml 18.74 ml/m LA Biplane Vol: 39.4 ml 21.59 ml/m  AORTIC VALVE                    PULMONIC VALVE AV Area (Vmax):    2.63 cm     PV Vmax:       0.60 m/s AV Area (Vmean):   2.57 cm     PV Vmean:      38.900 cm/s AV Area (VTI):     2.35 cm     PV VTI:        0.092 m AV Vmax:           146.00 cm/s  PV Peak grad:  1.5 mmHg AV Vmean:          106.000 cm/s PV Mean grad:  1.0 mmHg AV VTI:            0.230 m AV Peak Grad:      8.5 mmHg AV Mean Grad:      5.0 mmHg LVOT Vmax:         101.00 cm/s LVOT Vmean:        71.800 cm/s LVOT VTI:          0.142 m LVOT/AV VTI ratio: 0.62  AORTA Ao Root diam: 3.40 cm MITRAL VALVE               TRICUSPID VALVE MV Area (PHT): 4.10 cm    TR Peak grad:   21.7 mmHg MV Peak grad:  4.0 mmHg    TR Vmax:        233.00 cm/s MV Mean grad:  2.0 mmHg MV Vmax:       1.00 m/s    SHUNTS MV Vmean:      70.8 cm/s   Systemic VTI:  0.14 m MV Decel Time: 185 msec    Systemic Diam: 2.20 cm MV E velocity: 93.03 cm/s Isaias Cowman MD Electronically signed by Isaias Cowman MD Signature Date/Time: 09/29/2020/1:18:12 PM    Final     Scheduled Meds: . folic acid  1 mg Oral Daily  . LORazepam  0-4 mg Intravenous Q6H    Followed by  . [START ON 10/01/2020] LORazepam  0-4  mg Intravenous Q12H  . multivitamin with minerals  1 tablet Oral Daily  . sodium chloride flush  3 mL Intravenous Q12H  . thiamine  100 mg Oral Daily   Or  . thiamine  100 mg Intravenous Daily   Continuous Infusions: . sodium chloride 125 mL/hr at 09/29/20 1442     LOS: 1 day   Time spent: 30 minutes   Darliss Cheney, MD [facility]  09/30/2020, 11:21 AM   To contact the attending provider between 7A-7P or the covering provider during after hours 7P-7A, please log into the web site [email or web address].

## 2020-09-30 NOTE — ED Notes (Signed)
Day shift provider notified of: "This pt has been having multiple episodes of of asystole on the monitor followed by bradycardia. No interventions besides pt placed on pacing pads with zoll but not currently being paced. Night shift provider made aware multiple times. Pt is supposed to get a pacer implant today. "

## 2020-09-30 NOTE — Progress Notes (Signed)
[employer] rep at bedside, reviewed pacemaker with patient and wife, discussed follow up care , both patient and wife verbalize understanding .  VVI 50 pacemaker

## 2020-10-01 LAB — CBC
HCT: 36.5 % — ABNORMAL LOW (ref 39.0–52.0)
Hemoglobin: 12.7 g/dL — ABNORMAL LOW (ref 13.0–17.0)
MCH: 35.3 pg — ABNORMAL HIGH (ref 26.0–34.0)
MCHC: 34.8 g/dL (ref 30.0–36.0)
MCV: 101.4 fL — ABNORMAL HIGH (ref 80.0–100.0)
Platelets: 224 10*3/uL (ref 150–400)
RBC: 3.6 MIL/uL — ABNORMAL LOW (ref 4.22–5.81)
RDW: 13.1 % (ref 11.5–15.5)
WBC: 7 10*3/uL (ref 4.0–10.5)
nRBC: 0 % (ref 0.0–0.2)

## 2020-10-01 LAB — BASIC METABOLIC PANEL
Anion gap: 9 (ref 5–15)
BUN: 14 mg/dL (ref 8–23)
CO2: 21 mmol/L — ABNORMAL LOW (ref 22–32)
Calcium: 8.3 mg/dL — ABNORMAL LOW (ref 8.9–10.3)
Chloride: 103 mmol/L (ref 98–111)
Creatinine, Ser: 0.77 mg/dL (ref 0.61–1.24)
GFR, Estimated: >60 mL/min (ref >60)
Glucose, Bld: 94 mg/dL (ref 70–99)
Potassium: 3.9 mmol/L (ref 3.5–5.1)
Sodium: 133 mmol/L — ABNORMAL LOW (ref 135–145)

## 2020-10-01 LAB — GLUCOSE, CAPILLARY: Glucose-Capillary: 94 mg/dL (ref 70–99)

## 2020-10-01 LAB — MAGNESIUM: Magnesium: 1.9 mg/dL (ref 1.7–2.4)

## 2020-10-01 MED ORDER — APIXABAN 5 MG PO TABS: 5.0000 mg | ORAL_TABLET | Freq: Two times a day (BID) | ORAL | 0 refills | Status: DC

## 2020-10-01 NOTE — Progress Notes (Signed)
[facility] Cardiology  SUBJECTIVE: Patient laying in bed, denies chest pain or shortness of breath   Vitals:   09/30/20 1724 09/30/20 1933 10/01/20 0116 10/01/20 0459  BP: (!) 146/84 (!) 153/81 (!) 156/71 (!) 157/79  Pulse: (!) 51 62 (!) 54 (!) 52  Resp: 18 17 18 16   Temp: 98.6 F (37 C) 98 F (36.7 C) 98.4 F (36.9 C) 98.3 F (36.8 C)  TempSrc: Oral Oral Oral Oral  SpO2: 96% 96% 96% 95%  Weight: 67.8 kg   61.6 kg  Height: 5\' 10"  (1.778 m)        Intake/Output Summary (Last 24 hours) at 10/01/2020 0825 Last data filed at 10/01/2020 0500 Gross per 24 hour  Intake 100 ml  Output 290 ml  Net -190 ml      PHYSICAL EXAM  General: Well developed, well nourished, in no acute distress HEENT:  Normocephalic and atramatic Neck:  No JVD.  Lungs: Clear bilaterally to auscultation and percussion. Heart: HRRR . Normal S1 and S2 without gallops or murmurs.  Abdomen: Bowel sounds are positive, abdomen soft and non-tender  Msk:  Back normal, normal gait. Normal strength and tone for age. Extremities: No clubbing, cyanosis or edema.   Neuro: Alert and oriented X 3. Psych:  Good affect, responds appropriately   LABS: Basic Metabolic Panel: Recent Labs    09/29/20 0701 09/29/20 1440 09/30/20 0421  NA  --  139 137  K  --  3.8 3.6  CL  --  107 106  CO2  --  22 22  GLUCOSE  --  93 88  BUN  --  10 10  CREATININE 0.55* 0.66 0.61  CALCIUM  --  8.6* 8.3*  MG 1.7  --   --   PHOS 3.3  --   --    Liver Function Tests: Recent Labs    09/28/20 2246  AST 76*  ALT 44  ALKPHOS 49  BILITOT 0.8  PROT 6.5  ALBUMIN 3.7   No results for input(s): LIPASE, AMYLASE in the last 72 hours. CBC: Recent Labs    09/28/20 2246 09/30/20 0421 10/01/20 0804  WBC 8.5 6.7 7.0  NEUTROABS 6.5 4.0  --   HGB 14.0 13.8 12.7*  HCT 40.3 40.4 36.5*  MCV 102.0* 102.0* 101.4*  PLT 244 244 224   Cardiac Enzymes: No results for input(s): CKTOTAL, CKMB, CKMBINDEX, TROPONINI in the last 72  hours. BNP: Invalid input(s): POCBNP D-Dimer: No results for input(s): DDIMER in the last 72 hours. Hemoglobin A1C: No results for input(s): HGBA1C in the last 72 hours. Fasting Lipid Panel: No results for input(s): CHOL, HDL, LDLCALC, TRIG, CHOLHDL, LDLDIRECT in the last 72 hours. Thyroid Function Tests: Recent Labs    09/28/20 2246  TSH 2.721   Anemia Panel: No results for input(s): VITAMINB12, FOLATE, FERRITIN, TIBC, IRON, RETICCTPCT in the last 72 hours.  DG Shoulder Right  Result Date: 09/29/2020 CLINICAL DATA:  Right shoulder pain after fall yesterday. EXAM: RIGHT SHOULDER - 2+ VIEW COMPARISON:  None. FINDINGS: There is no evidence of fracture or dislocation. Severe degenerative changes seen involving the right acromioclavicular joint. Soft tissues are unremarkable. 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 No acute abnormality seen. Electronically Signed   By: Marijo Conception M.D.   On: 09/29/2020 12:23   EP PPM/ICD IMPLANT  Result Date: 09/30/2020 Successful Micra AV leadless pacemaker implantation  ECHOCARDIOGRAM COMPLETE  Result Date: 09/29/2020    ECHOCARDIOGRAM REPORT   Patient  Name:   Marcellis Cordle Date of Exam: 09/29/2020 Medical Rec #:  [id number]  Height:       70.5 in Accession #:    [id number] Weight:       144.0 lb Date of Birth:  [birth year]   BSA:          1.825 m Patient Age:    17 years   BP:           111/91 mmHg Patient Gender: M          HR:           107 bpm. Exam Location:  [facility] Procedure: 2D Echo, Color Doppler and Cardiac Doppler Indications:     I48.91 Atrial Fibrillation  History:         Patient has no prior history of Echocardiogram examinations.                  Signs/Symptoms:Syncope.  Sonographer:     Charmayne Sheer RDCS (AE) Referring Phys:  [id number] Athena Masse Diagnosing Phys: Isaias Cowman MD  Sonographer Comments: Suboptimal subcostal window. IMPRESSIONS  1. Left ventricular ejection fraction, by estimation,  is 50 to 55%. The left ventricle has low normal function. The left ventricle has no regional wall motion abnormalities. Left ventricular diastolic parameters are indeterminate.  2. Right ventricular systolic function is normal. The right ventricular size is normal.  3. The mitral valve is normal in structure. Mild mitral valve regurgitation. No evidence of mitral stenosis.  4. The aortic valve is normal in structure. Aortic valve regurgitation is not visualized. No aortic stenosis is present.  5. The inferior vena cava is normal in size with greater than 50% respiratory variability, suggesting right atrial pressure of 3 mmHg. FINDINGS  Left Ventricle: Left ventricular ejection fraction, by estimation, is 50 to 55%. The left ventricle has low normal function. The left ventricle has no regional wall motion abnormalities. The left ventricular internal cavity size was normal in size. There is no left ventricular hypertrophy. Left ventricular diastolic parameters are indeterminate. Right Ventricle: The right ventricular size is normal. No increase in right ventricular wall thickness. Right ventricular systolic function is normal. Left Atrium: Left atrial size was normal in size. Right Atrium: Right atrial size was normal in size. Pericardium: There is no evidence of pericardial effusion. Mitral Valve: The mitral valve is normal in structure. Mild mitral valve regurgitation. No evidence of mitral valve stenosis. MV peak gradient, 4.0 mmHg. The mean mitral valve gradient is 2.0 mmHg. Tricuspid Valve: The tricuspid valve is normal in structure. Tricuspid valve regurgitation is mild . No evidence of tricuspid stenosis. Aortic Valve: The aortic valve is normal in structure. Aortic valve regurgitation is not visualized. No aortic stenosis is present. Aortic valve mean gradient measures 5.0 mmHg. Aortic valve peak gradient measures 8.5 mmHg. Aortic valve area, by VTI measures 2.35 cm. Pulmonic Valve: The pulmonic valve was  normal in structure. Pulmonic valve regurgitation is not visualized. No evidence of pulmonic stenosis. Aorta: The aortic root is normal in size and structure. Venous: The inferior vena cava is normal in size with greater than 50% respiratory variability, suggesting right atrial pressure of 3 mmHg. IAS/Shunts: No atrial level shunt detected by color flow Doppler.  LEFT VENTRICLE PLAX 2D LVIDd:         5.21 cm  Diastology LVIDs:         3.88 cm  LV e' medial:    8.27 cm/s LV PW:  1.05 cm  LV E/e' medial:  11.2 LV IVS:        0.84 cm  LV e' lateral:   14.40 cm/s LVOT diam:     2.20 cm  LV E/e' lateral: 6.5 LV SV:         54 LV SV Index:   30 LVOT Area:     3.80 cm  RIGHT VENTRICLE RV Basal diam:  2.79 cm LEFT ATRIUM             Index       RIGHT ATRIUM           Index LA diam:        3.70 cm 2.03 cm/m  RA Area:     14.40 cm LA Vol (A2C):   39.7 ml 21.75 ml/m RA Volume:   34.40 ml  18.85 ml/m LA Vol (A4C):   34.2 ml 18.74 ml/m LA Biplane Vol: 39.4 ml 21.59 ml/m  AORTIC VALVE                    PULMONIC VALVE AV Area (Vmax):    2.63 cm     PV Vmax:       0.60 m/s AV Area (Vmean):   2.57 cm     PV Vmean:      38.900 cm/s AV Area (VTI):     2.35 cm     PV VTI:        0.092 m AV Vmax:           146.00 cm/s  PV Peak grad:  1.5 mmHg AV Vmean:          106.000 cm/s PV Mean grad:  1.0 mmHg AV VTI:            0.230 m AV Peak Grad:      8.5 mmHg AV Mean Grad:      5.0 mmHg LVOT Vmax:         101.00 cm/s LVOT Vmean:        71.800 cm/s LVOT VTI:          0.142 m LVOT/AV VTI ratio: 0.62  AORTA Ao Root diam: 3.40 cm MITRAL VALVE               TRICUSPID VALVE MV Area (PHT): 4.10 cm    TR Peak grad:   21.7 mmHg MV Peak grad:  4.0 mmHg    TR Vmax:        233.00 cm/s MV Mean grad:  2.0 mmHg MV Vmax:       1.00 m/s    SHUNTS MV Vmean:      70.8 cm/s   Systemic VTI:  0.14 m MV Decel Time: 185 msec    Systemic Diam: 2.20 cm MV E velocity: 93.03 cm/s Isaias Cowman MD Electronically signed by Isaias Cowman MD  Signature Date/Time: 09/29/2020/1:18:12 PM    Final      Echo LVEF 50-55%  TELEMETRY: Ventricular pacing at 50 bpm:  ASSESSMENT AND PLAN:  Principal Problem:   Rapid atrial fibrillation (HCC) Active Problems:   Recurrent syncope   Anxiety   Alcohol use disorder, mild, abuse   Elevated lactic acid level    1. New onset atrial fibrillation, with a chads vasc score of 2, with rates in the 90s-140s, with a history of alcohol abuse.  Patient started on Eliquis 5 mg twice daily 2. Sinus pauses and intermittent bradycardia, noted on telemetry, status post Micra AV leadless pacemaker  3. Syncope, likely multifactorial, with several long sinus pauses during this hospitalization 4. Alcohol abuse, drinks 16-18 ounces of wine or 3 beers per day. 5. Anxiety, takes Valium 5 mg BID and Zoloft daily  Recommendations  1.  Resume Eliquis 5 mg twice daily 2.  May discharge home 3.  Follow-up in 1 week 4.  Leave bandage on till 10/03/2020 at which time may shower   Isaias Cowman, MD, PhD, [facility] 10/01/2020 8:25 AM

## 2020-10-01 NOTE — Progress Notes (Signed)
Patient Collin Knight and vitals WDL at time of discharge. Wife at bedside and transporting patient home. Volunteer took patient down to [facility] for discharge,

## 2020-10-01 NOTE — Progress Notes (Signed)
CCMD called and reported a 14 beat run of v-tach.  Pt sleeping upon entrance to room but easily aroused.  Pt denies any pain ,sob, or distress of any kind.  V/s's stable. Will continue to observe.

## 2020-10-01 NOTE — Progress Notes (Signed)
Rufina Falco, NP notified via secure chat of run of v-tach and benign assessment.

## 2020-10-01 NOTE — Discharge Summary (Signed)
Physician Discharge Summary  Preston Garabedian ALP:[id number] DOB: 04/22/1946 DOA: 09/28/2020  PCP: [name], MD  Admit date: 09/28/2020 Discharge date: 10/01/2020  Admitted From: Home Disposition: Home  Recommendations for Outpatient Follow-up:  1. Follow up with PCP in 1-2 weeks 2. Follow-up with cardiology within 1 week 3. Please obtain BMP/CBC in one week 4. Please follow up with your PCP on the following pending results: Unresulted Labs (From admission, onward)         None       Home Health: None Equipment/Devices: None  Discharge Condition: Stable CODE STATUS: Full code Diet recommendation: Cardiac  Subjective: Seen and examined this morning.  Feels much better.  No chest pain or shortness of breath.  Excited to go home.  Brief/Interim Summary: Collin Knight a 74 y.o.malewith medical history significant foranxiety, alcohol use, who presented to the emergency room after an episode of blacking out while in the bathroom. He has no recollection of the incident but appeared to have hit his head. He was back to baseline by arrival to the emergency room. States he has a history of recurrent dizzy spellsand falls but this has been the worst one yet.  According to patient, he has history of falling about 7 times in 17 months but he never sought any medical attention. Patient admits to drinking about 3 beers daily. Prior to the episode he was in his usual state of health. Denies preceding headache, visual disturbance, chest pain, shortness of breath or palpitations, lightheadedness or one-sided numbness weakness or tingling.EMS reported heart rate 100-150 A. Fib.Patient reportedly took 2 Valium with some alcohol earlier in the dayper report.  ED Course:On arrival afebrile, BP 141/71 heart rate 114 with A. fib on monitor. O2 sat 100% on room air.While in the ER patient noted to be flipping in and out of A. fib with RVRwith rates up to 130 alternating with sinus bradycardia  rate in the 50s.On blood work, troponin was 8, BNP 223, lactic acid 2.9. TSH 2.7. Other blood work unremarkable. EKG as reviewed by me :Sinus rhythm at 62 Imaging: Chest x-ray clear. CT head and C-spine without evidence of acute trauma Patient hydrated in the ER.    Admitted to hospital service.  Due to tachybradycardia syndrome with atrial fibrillation, cardiology was consulted.  Patient then underwent placement of Micra AV leadless pacemaker on 09/30/2020.  He was reevaluated by cardiology and cleared for discharge.  They recommended discharging on Eliquis 5 mg p.o. twice daily starting tomorrow.  He will follow up with cardiology as well as PCP.   Discharge Diagnoses:  Principal Problem:   Rapid atrial fibrillation [facility]) Active Problems:   Recurrent syncope   Anxiety   Alcohol use disorder, mild, abuse   Elevated lactic acid level    Discharge Instructions  Discharge Instructions    Amb referral to [facility]   Complete by: As directed      Allergies as of 10/01/2020   No Known Allergies     Medication List    TAKE these medications   apixaban 5 MG Tabs tablet Commonly known as: ELIQUIS Take 1 tablet (5 mg total) by mouth 2 (two) times daily.   diazepam 5 MG tablet Commonly known as: VALIUM Take 5 mg by mouth 2 (two) times daily as needed.   sertraline 25 MG tablet Commonly known as: ZOLOFT Take 25 mg by mouth daily.       Follow-up Information    Paraschos, Alexander, MD Follow up in 1 week(s).  Specialty: Cardiology Contact information: [facility] [place] [address] [phone]        [name], MD Follow up in 1 week(s).   Specialty: Family Medicine Contact information: [address] [place] [address] [phone]              No Known Allergies  Consultations: Cardiology   Procedures/Studies: DG Shoulder Right  Result Date: 09/29/2020 CLINICAL  DATA:  Right shoulder pain after fall yesterday. EXAM: RIGHT SHOULDER - 2+ VIEW COMPARISON:  None. FINDINGS: There is no evidence of fracture or dislocation. Severe degenerative changes seen involving the right acromioclavicular joint. Soft tissues are unremarkable. 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 No acute abnormality seen. Electronically Signed   By: Marijo Conception M.D.   On: 09/29/2020 12:23   CT Head Wo Contrast  Result Date: 09/28/2020 CLINICAL DATA:  Loss of consciousness EXAM: CT HEAD WITHOUT CONTRAST TECHNIQUE: Contiguous axial images were obtained from the base of the skull through the vertex without intravenous contrast. COMPARISON:  None. FINDINGS: Brain: No evidence of acute territorial infarction, hemorrhage, hydrocephalus,extra-axial collection or mass lesion/mass effect. There is dilatation the ventricles and sulci consistent with age-related atrophy. Low-attenuation changes in the deep white matter consistent with small vessel ischemia. Vascular: No hyperdense vessel or unexpected calcification. Skull: The skull is intact. No fracture or focal lesion identified. Sinuses/Orbits: The visualized paranasal sinuses and mastoid air cells are clear. The orbits and globes intact. Other: None Cervical spine: Alignment: Physiologic Skull base and vertebrae: Visualized skull base is intact. No atlanto-occipital dissociation. The vertebral body heights are well maintained. No fracture or pathologic osseous lesion seen. Soft tissues and spinal canal: The visualized paraspinal soft tissues are unremarkable. No prevertebral soft tissue swelling is seen. The spinal canal is grossly unremarkable, no large epidural collection or significant canal narrowing. Disc levels: Multilevel cervical spine spondylosis is seen with disc osteophyte complex and uncovertebral osteophytes most notable at C5-C6 with severe bilateral neural foraminal narrowing and moderate central  canal stenosis. Upper chest: Paraseptal emphysematous changes seen at both lung apices. Thoracic inlet is within normal limits. Other: None IMPRESSION: No acute intracranial abnormality. Findings consistent with age related atrophy and chronic small vessel ischemia No acute fracture or malalignment of the spine. Electronically Signed   By: Prudencio Pair M.D.   On: 09/28/2020 22:21   CT Cervical Spine Wo Contrast  Result Date: 09/28/2020 CLINICAL DATA:  Loss of consciousness EXAM: CT HEAD WITHOUT CONTRAST TECHNIQUE: Contiguous axial images were obtained from the base of the skull through the vertex without intravenous contrast. COMPARISON:  None. FINDINGS: Brain: No evidence of acute territorial infarction, hemorrhage, hydrocephalus,extra-axial collection or mass lesion/mass effect. There is dilatation the ventricles and sulci consistent with age-related atrophy. Low-attenuation changes in the deep white matter consistent with small vessel ischemia. Vascular: No hyperdense vessel or unexpected calcification. Skull: The skull is intact. No fracture or focal lesion identified. Sinuses/Orbits: The visualized paranasal sinuses and mastoid air cells are clear. The orbits and globes intact. Other: None Cervical spine: Alignment: Physiologic Skull base and vertebrae: Visualized skull base is intact. No atlanto-occipital dissociation. The vertebral body heights are well maintained. No fracture or pathologic osseous lesion seen. Soft tissues and spinal canal: The visualized paraspinal soft tissues are unremarkable. No prevertebral soft tissue swelling is seen. The spinal canal is grossly unremarkable, no large epidural collection or significant canal narrowing. Disc levels: Multilevel cervical spine spondylosis is  seen with disc osteophyte complex and uncovertebral osteophytes most notable at C5-C6 with severe bilateral neural foraminal narrowing and moderate central canal stenosis. Upper chest: Paraseptal emphysematous  changes seen at both lung apices. Thoracic inlet is within normal limits. Other: None IMPRESSION: No acute intracranial abnormality. Findings consistent with age related atrophy and chronic small vessel ischemia No acute fracture or malalignment of the spine. Electronically Signed   By: Prudencio Pair M.D.   On: 09/28/2020 22:21   EP PPM/ICD IMPLANT  Result Date: 09/30/2020 Successful Micra AV leadless pacemaker implantation  DG Chest Portable 1 View  Result Date: 09/29/2020 CLINICAL DATA:  Syncope EXAM: PORTABLE CHEST 1 VIEW COMPARISON:  None. FINDINGS: The heart size and mediastinal contours are mildly enlarged. Aortic knob calcifications are seen. Both lungs are clear. The visualized skeletal structures are unremarkable. IMPRESSION: No active disease. Electronically Signed   By: Prudencio Pair M.D.   On: 09/29/2020 00:24   ECHOCARDIOGRAM COMPLETE  Result Date: 09/29/2020    ECHOCARDIOGRAM REPORT   Patient Name:   Collin Knight Date of Exam: 09/29/2020 Medical Rec #:  [id number]  Height:       70.5 in Accession #:    [id number] Weight:       144.0 lb Date of Birth:  08/24/1946   BSA:          1.825 m Patient Age:    74 years   BP:           111/91 mmHg Patient Gender: M          HR:           107 bpm. Exam Location:  [facility] Procedure: 2D Echo, Color Doppler and Cardiac Doppler Indications:     I48.91 Atrial Fibrillation  History:         Patient has no prior history of Echocardiogram examinations.                  Signs/Symptoms:Syncope.  Sonographer:     Charmayne Sheer RDCS (AE) Referring Phys:  [id number] Athena Masse Diagnosing Phys: Isaias Cowman MD  Sonographer Comments: Suboptimal subcostal window. IMPRESSIONS  1. Left ventricular ejection fraction, by estimation, is 50 to 55%. The left ventricle has low normal function. The left ventricle has no regional wall motion abnormalities. Left ventricular diastolic parameters are indeterminate.  2. Right ventricular systolic function is normal. The right  ventricular size is normal.  3. The mitral valve is normal in structure. Mild mitral valve regurgitation. No evidence of mitral stenosis.  4. The aortic valve is normal in structure. Aortic valve regurgitation is not visualized. No aortic stenosis is present.  5. The inferior vena cava is normal in size with greater than 50% respiratory variability, suggesting right atrial pressure of 3 mmHg. FINDINGS  Left Ventricle: Left ventricular ejection fraction, by estimation, is 50 to 55%. The left ventricle has low normal function. The left ventricle has no regional wall motion abnormalities. The left ventricular internal cavity size was normal in size. There is no left ventricular hypertrophy. Left ventricular diastolic parameters are indeterminate. Right Ventricle: The right ventricular size is normal. No increase in right ventricular wall thickness. Right ventricular systolic function is normal. Left Atrium: Left atrial size was normal in size. Right Atrium: Right atrial size was normal in size. Pericardium: There is no evidence of pericardial effusion. Mitral Valve: The mitral valve is normal in structure. Mild mitral valve regurgitation. No evidence of mitral valve stenosis. MV peak gradient, 4.0 mmHg. The  mean mitral valve gradient is 2.0 mmHg. Tricuspid Valve: The tricuspid valve is normal in structure. Tricuspid valve regurgitation is mild . No evidence of tricuspid stenosis. Aortic Valve: The aortic valve is normal in structure. Aortic valve regurgitation is not visualized. No aortic stenosis is present. Aortic valve mean gradient measures 5.0 mmHg. Aortic valve peak gradient measures 8.5 mmHg. Aortic valve area, by VTI measures 2.35 cm. Pulmonic Valve: The pulmonic valve was normal in structure. Pulmonic valve regurgitation is not visualized. No evidence of pulmonic stenosis. Aorta: The aortic root is normal in size and structure. Venous: The inferior vena cava is normal in size with greater than 50% respiratory  variability, suggesting right atrial pressure of 3 mmHg. IAS/Shunts: No atrial level shunt detected by color flow Doppler.  LEFT VENTRICLE PLAX 2D LVIDd:         5.21 cm  Diastology LVIDs:         3.88 cm  LV e' medial:    8.27 cm/s LV PW:         1.05 cm  LV E/e' medial:  11.2 LV IVS:        0.84 cm  LV e' lateral:   14.40 cm/s LVOT diam:     2.20 cm  LV E/e' lateral: 6.5 LV SV:         54 LV SV Index:   30 LVOT Area:     3.80 cm  RIGHT VENTRICLE RV Basal diam:  2.79 cm LEFT ATRIUM             Index       RIGHT ATRIUM           Index LA diam:        3.70 cm 2.03 cm/m  RA Area:     14.40 cm LA Vol (A2C):   39.7 ml 21.75 ml/m RA Volume:   34.40 ml  18.85 ml/m LA Vol (A4C):   34.2 ml 18.74 ml/m LA Biplane Vol: 39.4 ml 21.59 ml/m  AORTIC VALVE                    PULMONIC VALVE AV Area (Vmax):    2.63 cm     PV Vmax:       0.60 m/s AV Area (Vmean):   2.57 cm     PV Vmean:      38.900 cm/s AV Area (VTI):     2.35 cm     PV VTI:        0.092 m AV Vmax:           146.00 cm/s  PV Peak grad:  1.5 mmHg AV Vmean:          106.000 cm/s PV Mean grad:  1.0 mmHg AV VTI:            0.230 m AV Peak Grad:      8.5 mmHg AV Mean Grad:      5.0 mmHg LVOT Vmax:         101.00 cm/s LVOT Vmean:        71.800 cm/s LVOT VTI:          0.142 m LVOT/AV VTI ratio: 0.62  AORTA Ao Root diam: 3.40 cm MITRAL VALVE               TRICUSPID VALVE MV Area (PHT): 4.10 cm    TR Peak grad:   21.7 mmHg MV Peak grad:  4.0 mmHg    TR Vmax:  233.00 cm/s MV Mean grad:  2.0 mmHg MV Vmax:       1.00 m/s    SHUNTS MV Vmean:      70.8 cm/s   Systemic VTI:  0.14 m MV Decel Time: 185 msec    Systemic Diam: 2.20 cm MV E velocity: 93.03 cm/s Isaias Cowman MD Electronically signed by Isaias Cowman MD Signature Date/Time: 09/29/2020/1:18:12 PM    Final       Discharge Exam: Vitals:   10/01/20 0459 10/01/20 0849  BP: (!) 157/79 (!) 148/84  Pulse: (!) 52 (!) 50  Resp: 16 18  Temp: 98.3 F (36.8 C) 98.6 F (37 C)  SpO2: 95% 97%    Vitals:   09/30/20 1933 10/01/20 0116 10/01/20 0459 10/01/20 0849  BP: (!) 153/81 (!) 156/71 (!) 157/79 (!) 148/84  Pulse: 62 (!) 54 (!) 52 (!) 50  Resp: 17 18 16 18   Temp: 98 F (36.7 C) 98.4 F (36.9 C) 98.3 F (36.8 C) 98.6 F (37 C)  TempSrc: Oral Oral Oral Oral  SpO2: 96% 96% 95% 97%  Weight:   61.6 kg   Height:        General: Pt is alert, awake, not in acute distress Cardiovascular: RRR, S1/S2 +, no rubs, no gallops Respiratory: CTA bilaterally, no wheezing, no rhonchi Abdominal: Soft, NT, ND, bowel sounds + Extremities: no edema, no cyanosis    The results of significant diagnostics from this hospitalization (including imaging, microbiology, ancillary and laboratory) are listed below for reference.     Microbiology: Recent Results (from the past 240 hour(s))  Resp Panel by RT-PCR (Flu A&B, Covid) Nasopharyngeal Swab     Status: None   Collection Time: 09/29/20  7:01 AM   Specimen: Nasopharyngeal Swab; Nasopharyngeal(NP) swabs in vial transport medium  Result Value Ref Range Status   SARS Coronavirus 2 by RT PCR NEGATIVE NEGATIVE Final    Comment: (NOTE) SARS-CoV-2 target nucleic acids are NOT DETECTED.  The SARS-CoV-2 RNA is generally detectable in upper respiratory specimens during the acute phase of infection. The lowest concentration of SARS-CoV-2 viral copies this assay can detect is 138 copies/mL. A negative result does not preclude SARS-Cov-2 infection and should not be used as the sole basis for treatment or other patient management decisions. A negative result may occur with  improper specimen collection/handling, submission of specimen other than nasopharyngeal swab, presence of viral mutation(s) within the areas targeted by this assay, and inadequate number of viral copies(<138 copies/mL). A negative result must be combined with clinical observations, patient history, and epidemiological information. The expected result is Negative.  Fact Sheet  for Patients:  EntrepreneurPulse.com.au  Fact Sheet for Healthcare Providers:  IncredibleEmployment.be  This test is no t yet approved or cleared by the [place] FDA and  has been authorized for detection and/or diagnosis of SARS-CoV-2 by FDA under an Emergency Use Authorization (EUA). This EUA will remain  in effect (meaning this test can be used) for the duration of the COVID-19 declaration under Section 564(b)(1) of the Act, 21 U.S.C.section 360bbb-3(b)(1), unless the authorization is terminated  or revoked sooner.       Influenza A by PCR NEGATIVE NEGATIVE Final   Influenza B by PCR NEGATIVE NEGATIVE Final    Comment: (NOTE) The Xpert Xpress SARS-CoV-2/FLU/RSV plus assay is intended as an aid in the diagnosis of influenza from Nasopharyngeal swab specimens and should not be used as a sole basis for treatment. Nasal washings and aspirates are unacceptable for Xpert Xpress  SARS-CoV-2/FLU/RSV testing.  Fact Sheet for Patients: EntrepreneurPulse.com.au  Fact Sheet for Healthcare Providers: IncredibleEmployment.be  This test is not yet approved or cleared by the [place] FDA and has been authorized for detection and/or diagnosis of SARS-CoV-2 by FDA under an Emergency Use Authorization (EUA). This EUA will remain in effect (meaning this test can be used) for the duration of the COVID-19 declaration under Section 564(b)(1) of the Act, 21 U.S.C. section 360bbb-3(b)(1), unless the authorization is terminated or revoked.  Performed at [facility], [address] [place], [place] [address]      Labs: BNP (last 3 results) Recent Labs    09/28/20 2246  BNP 811.5*   Basic Metabolic Panel: Recent Labs  Lab 09/28/20 2246 09/29/20 0701 09/29/20 1440 09/30/20 0421 10/01/20 0804  NA 138  --  139 137 133*  K 4.3  --  3.8 3.6 3.9  CL 102  --  107 106 103  CO2 26  --  22 22 21*   GLUCOSE 86  --  93 88 94  BUN 10  --  10 10 14   CREATININE 0.59* 0.55* 0.66 0.61 0.77  CALCIUM 7.8*  --  8.6* 8.3* 8.3*  MG  --  1.7  --   --  1.9  PHOS  --  3.3  --   --   --    Liver Function Tests: Recent Labs  Lab 09/28/20 2246  AST 76*  ALT 44  ALKPHOS 49  BILITOT 0.8  PROT 6.5  ALBUMIN 3.7   No results for input(s): LIPASE, AMYLASE in the last 168 hours. No results for input(s): AMMONIA in the last 168 hours. CBC: Recent Labs  Lab 09/28/20 2246 09/30/20 0421 10/01/20 0804  WBC 8.5 6.7 7.0  NEUTROABS 6.5 4.0  --   HGB 14.0 13.8 12.7*  HCT 40.3 40.4 36.5*  MCV 102.0* 102.0* 101.4*  PLT 244 244 224   Cardiac Enzymes: No results for input(s): CKTOTAL, CKMB, CKMBINDEX, TROPONINI in the last 168 hours. BNP: Invalid input(s): POCBNP CBG: Recent Labs  Lab 09/29/20 0700 09/30/20 0435 10/01/20 0508  GLUCAP 95 81 94   D-Dimer No results for input(s): DDIMER in the last 72 hours. Hgb A1c No results for input(s): HGBA1C in the last 72 hours. Lipid Profile No results for input(s): CHOL, HDL, LDLCALC, TRIG, CHOLHDL, LDLDIRECT in the last 72 hours. Thyroid function studies Recent Labs    09/28/20 2246  TSH 2.721   Anemia work up No results for input(s): VITAMINB12, FOLATE, FERRITIN, TIBC, IRON, RETICCTPCT in the last 72 hours. Urinalysis No results found for: COLORURINE, APPEARANCEUR, [place], [place], GLUCOSEU, [place], [place], [place], PROTEINUR, UROBILINOGEN, NITRITE, LEUKOCYTESUR Sepsis Labs Invalid input(s): PROCALCITONIN,  WBC,  LACTICIDVEN Microbiology Recent Results (from the past 240 hour(s))  Resp Panel by RT-PCR (Flu A&B, Covid) Nasopharyngeal Swab     Status: None   Collection Time: 09/29/20  7:01 AM   Specimen: Nasopharyngeal Swab; Nasopharyngeal(NP) swabs in vial transport medium  Result Value Ref Range Status   SARS Coronavirus 2 by RT PCR NEGATIVE NEGATIVE Final    Comment: (NOTE) SARS-CoV-2 target nucleic acids are NOT  DETECTED.  The SARS-CoV-2 RNA is generally detectable in upper respiratory specimens during the acute phase of infection. The lowest concentration of SARS-CoV-2 viral copies this assay can detect is 138 copies/mL. A negative result does not preclude SARS-Cov-2 infection and should not be used as the sole basis for treatment or other patient management decisions. A negative result may occur  with  improper specimen collection/handling, submission of specimen other than nasopharyngeal swab, presence of viral mutation(s) within the areas targeted by this assay, and inadequate number of viral copies(<138 copies/mL). A negative result must be combined with clinical observations, patient history, and epidemiological information. The expected result is Negative.  Fact Sheet for Patients:  EntrepreneurPulse.com.au  Fact Sheet for Healthcare Providers:  IncredibleEmployment.be  This test is no t yet approved or cleared by the [place] FDA and  has been authorized for detection and/or diagnosis of SARS-CoV-2 by FDA under an Emergency Use Authorization (EUA). This EUA will remain  in effect (meaning this test can be used) for the duration of the COVID-19 declaration under Section 564(b)(1) of the Act, 21 U.S.C.section 360bbb-3(b)(1), unless the authorization is terminated  or revoked sooner.       Influenza A by PCR NEGATIVE NEGATIVE Final   Influenza B by PCR NEGATIVE NEGATIVE Final    Comment: (NOTE) The Xpert Xpress SARS-CoV-2/FLU/RSV plus assay is intended as an aid in the diagnosis of influenza from Nasopharyngeal swab specimens and should not be used as a sole basis for treatment. Nasal washings and aspirates are unacceptable for Xpert Xpress SARS-CoV-2/FLU/RSV testing.  Fact Sheet for Patients: EntrepreneurPulse.com.au  Fact Sheet for Healthcare Providers: IncredibleEmployment.be  This test is not yet  approved or cleared by the [place] FDA and has been authorized for detection and/or diagnosis of SARS-CoV-2 by FDA under an Emergency Use Authorization (EUA). This EUA will remain in effect (meaning this test can be used) for the duration of the COVID-19 declaration under Section 564(b)(1) of the Act, 21 U.S.C. section 360bbb-3(b)(1), unless the authorization is terminated or revoked.  Performed at [facility], [address], [place], [place] [address]      Time coordinating discharge: Over 30 minutes  SIGNED:   Darliss Cheney, MD  [facility] 10/01/2020, 10:14 AM  If 7PM-7AM, please contact night-coverage [email or web address]

## 2020-10-01 NOTE — Discharge Instructions (Signed)
Leave bandage on until 10/03/2020 at which time may remove and shower.  Resume Eliquis 5 mg twice daily on 10/02/2020.

## 2020-10-03 ENCOUNTER — Encounter: Payer: Self-pay | Admitting: Cardiology

## 2020-10-28 DIAGNOSIS — Z950 Presence of cardiac pacemaker: Secondary | ICD-10-CM

## 2020-10-28 HISTORY — DX: Presence of cardiac pacemaker: Z95.0

## 2020-12-26 DIAGNOSIS — R030 Elevated blood-pressure reading, without diagnosis of hypertension: Secondary | ICD-10-CM | POA: Insufficient documentation

## 2020-12-26 DIAGNOSIS — R002 Palpitations: Secondary | ICD-10-CM | POA: Insufficient documentation

## 2021-09-25 ENCOUNTER — Encounter: Payer: Self-pay | Admitting: Gastroenterology

## 2021-09-25 ENCOUNTER — Ambulatory Visit: Payer: Medicare Other | Admitting: Anesthesiology

## 2021-09-25 ENCOUNTER — Encounter
Admission: RE | Disposition: A | Discharge status: Home / Self Care | Payer: Self-pay | Source: Home / Self Care | Attending: Gastroenterology

## 2021-09-25 ENCOUNTER — Ambulatory Visit
Admission: RE | Admit: 2021-09-25 | Discharge: 2021-09-25 | Disposition: A | Discharge status: Home / Self Care | Payer: Medicare Other | Attending: Gastroenterology | Admitting: Gastroenterology

## 2021-09-25 DIAGNOSIS — K52832 Lymphocytic colitis: Secondary | ICD-10-CM | POA: Insufficient documentation

## 2021-09-25 DIAGNOSIS — K529 Noninfective gastroenteritis and colitis, unspecified: Secondary | ICD-10-CM | POA: Diagnosis present

## 2021-09-25 DIAGNOSIS — R109 Unspecified abdominal pain: Secondary | ICD-10-CM | POA: Insufficient documentation

## 2021-09-25 DIAGNOSIS — K635 Polyp of colon: Secondary | ICD-10-CM | POA: Insufficient documentation

## 2021-09-25 DIAGNOSIS — K640 First degree hemorrhoids: Secondary | ICD-10-CM | POA: Insufficient documentation

## 2021-09-25 DIAGNOSIS — Z950 Presence of cardiac pacemaker: Secondary | ICD-10-CM | POA: Insufficient documentation

## 2021-09-25 DIAGNOSIS — D120 Benign neoplasm of cecum: Secondary | ICD-10-CM | POA: Insufficient documentation

## 2021-09-25 DIAGNOSIS — K5730 Diverticulosis of large intestine without perforation or abscess without bleeding: Secondary | ICD-10-CM | POA: Diagnosis not present

## 2021-09-25 DIAGNOSIS — Z7901 Long term (current) use of anticoagulants: Secondary | ICD-10-CM | POA: Insufficient documentation

## 2021-09-25 DIAGNOSIS — F419 Anxiety disorder, unspecified: Secondary | ICD-10-CM | POA: Diagnosis not present

## 2021-09-25 HISTORY — PX: COLONOSCOPY WITH PROPOFOL: SHX5780

## 2021-09-25 SURGERY — COLONOSCOPY WITH PROPOFOL
Anesthesia: General

## 2021-09-25 MED ORDER — PROPOFOL 500 MG/50ML IV EMUL
INTRAVENOUS | Status: DC | PRN
  Administered 2021-09-25: 120 ug/kg/min via INTRAVENOUS

## 2021-09-25 MED ORDER — ESMOLOL HCL 100 MG/10ML IV SOLN
INTRAVENOUS | Status: AC
  Filled 2021-09-25: qty 10

## 2021-09-25 MED ORDER — PHENYLEPHRINE HCL (PRESSORS) 10 MG/ML IV SOLN
INTRAVENOUS | Status: AC
  Filled 2021-09-25: qty 1

## 2021-09-25 MED ORDER — SODIUM CHLORIDE 0.9 % IV SOLN: INTRAVENOUS | Status: DC

## 2021-09-25 MED ORDER — PHENYLEPHRINE HCL (PRESSORS) 10 MG/ML IV SOLN
INTRAVENOUS | Status: DC | PRN
  Administered 2021-09-25: 50 ug via INTRAVENOUS
  Administered 2021-09-25: 100 ug via INTRAVENOUS

## 2021-09-25 MED ORDER — PROPOFOL 500 MG/50ML IV EMUL
INTRAVENOUS | Status: AC
  Filled 2021-09-25: qty 50

## 2021-09-25 MED ORDER — ESMOLOL HCL 100 MG/10ML IV SOLN
INTRAVENOUS | Status: DC | PRN
  Administered 2021-09-25 (×4): 10 mg via INTRAVENOUS

## 2021-09-25 NOTE — Anesthesia Preprocedure Evaluation (Signed)
Anesthesia Evaluation  Patient identified by MRN, date of birth, ID band Patient awake    Reviewed: Allergy & Precautions, H&P , NPO status , Patient's Chart, lab work & pertinent test results, reviewed documented beta blocker date and time   Airway Mallampati: II   Neck ROM: full    Dental  (+) Poor Dentition   Pulmonary neg pulmonary ROS,    Pulmonary exam normal        Cardiovascular Exercise Tolerance: Good negative cardio ROS Normal cardiovascular exam Rhythm:regular Rate:Normal     Neuro/Psych Anxiety negative neurological ROS  negative psych ROS   GI/Hepatic negative GI ROS, Neg liver ROS,   Endo/Other  negative endocrine ROS  Renal/GU negative Renal ROS  negative genitourinary   Musculoskeletal   Abdominal   Peds  Hematology negative hematology ROS (+)   Anesthesia Other Findings Past Medical History: No date: Anxiety Past Surgical History: No date: EYE SURGERY     Comment:  cataract both eyes 09/30/2020: PACEMAKER LEADLESS INSERTION; N/A     Comment:  Procedure: PACEMAKER LEADLESS INSERTION;  Surgeon:               Isaias Cowman, MD;  Location: [facility]               LAB;  Service: Cardiovascular;  Laterality: N/A; BMI    Body Mass Index: 20.37 kg/m     Reproductive/Obstetrics negative OB ROS                             Anesthesia Physical Anesthesia Plan  ASA: 3  Anesthesia Plan: General   Post-op Pain Management:    Induction:   PONV Risk Score and Plan:   Airway Management Planned:   Additional Equipment:   Intra-op Plan:   Post-operative Plan:   Informed Consent: I have reviewed the patients History and Physical, chart, labs and discussed the procedure including the risks, benefits and alternatives for the proposed anesthesia with the patient or authorized representative who has indicated his/her understanding and acceptance.      Dental Advisory Given  Plan Discussed with: CRNA  Anesthesia Plan Comments:         Anesthesia Quick Evaluation

## 2021-09-25 NOTE — Interval H&P Note (Signed)
History and Physical Interval Note: Preprocedure H&P from 09/25/21  was reviewed and there was no interval change after seeing and examining the patient.  Written consent was obtained from the patient after discussion of risks, benefits, and alternatives. Patient has consented to proceed with Colonoscopy with possible intervention   09/25/2021 8:20 AM  Collin Knight  has presented today for surgery, with the diagnosis of Diarrhea (R19.7).  The various methods of treatment have been discussed with the patient and family. After consideration of risks, benefits and other options for treatment, the patient has consented to  Procedure(s): COLONOSCOPY WITH PROPOFOL (N/A) as a surgical intervention.  The patient's history has been reviewed, patient examined, no change in status, stable for surgery.  I have reviewed the patient's chart and labs.  Questions were answered to the patient's satisfaction.     [name]

## 2021-09-25 NOTE — Op Note (Signed)
[facility] Gastroenterology Patient Name: Onis Markoff Procedure Date: 09/25/2021 8:23 AM MRN: [id number] Account #: [id number] Date of Birth: [birth year] Admit Type: Outpatient Age: 75 Room: [facility] ENDO ROOM 2 Gender: Male Note Status: Finalized Instrument Name: Colonscope [id number] Procedure:             Colonoscopy Indications:           Chronic diarrhea Providers:             Rueben Bash, DO Referring MD:          Irven Easterly. Kary Kos, MD (Referring MD) Medicines:             Monitored Anesthesia Care Complications:         No immediate complications. Estimated blood loss:                         Minimal. Procedure:             Pre-Anesthesia Assessment:                        - Prior to the procedure, a History and Physical was                         performed, and patient medications and allergies were                         reviewed. The patient is competent. The risks and                         benefits of the procedure and the sedation options and                         risks were discussed with the patient. All questions                         were answered and informed consent was obtained.                         Patient identification and proposed procedure were                         verified by the physician, the nurse, the anesthetist                         and the technician in the endoscopy suite. Mental                         Status Examination: alert and oriented. Airway                         Examination: normal oropharyngeal airway and neck                         mobility. Respiratory Examination: clear to                         auscultation. CV Examination: RRR, no murmurs, no S3  or S4. Prophylactic Antibiotics: The patient does not                         require prophylactic antibiotics. Prior                         Anticoagulants: The patient has taken Coumadin                         (warfarin), last  dose was 6 days prior to procedure.                         ASA Grade Assessment: III - A patient with severe                         systemic disease. After reviewing the risks and                         benefits, the patient was deemed in satisfactory                         condition to undergo the procedure. The anesthesia                         plan was to use monitored anesthesia care (MAC).                         Immediately prior to administration of medications,                         the patient was re-assessed for adequacy to receive                         sedatives. The heart rate, respiratory rate, oxygen                         saturations, blood pressure, adequacy of pulmonary                         ventilation, and response to care were monitored                         throughout the procedure. The physical status of the                         patient was re-assessed after the procedure.                        After obtaining informed consent, the colonoscope was                         passed under direct vision. Throughout the procedure,                         the patient's blood pressure, pulse, and oxygen                         saturations were monitored continuously. The  Colonoscope was introduced through the anus and                         advanced to the the cecum, identified by appendiceal                         orifice and ileocecal valve. The colonoscopy was                         performed without difficulty. The patient tolerated                         the procedure well. The quality of the bowel                         preparation was evaluated using the BBPS [facility] Bowel                         Preparation Scale) with scores of: Right Colon = 3,                         Transverse Colon = 3 and Left Colon = 3 (entire mucosa                         seen well with no residual staining, small fragments                          of stool or opaque liquid). The total BBPS score                         equals 9. The ileocecal valve, appendiceal orifice,                         and rectum were photographed. Findings:      The perianal and digital rectal examinations were normal. Pertinent       negatives include normal sphincter tone.      Two sessile polyps were found in the transverse colon and cecum. The       polyps were 2 to 3 mm in size. These polyps were removed with a cold       biopsy forceps. Resection and retrieval were complete. Estimated blood       loss was minimal.      Multiple small-mouthed diverticula were found in the entire colon.       Estimated blood loss: none.      Normal mucosa was found in the entire colon. Biopsies for histology were       taken with a cold forceps from the right colon and left colon for       evaluation of microscopic colitis. Estimated blood loss was minimal.      Non-bleeding internal hemorrhoids were found during retroflexion. The       hemorrhoids were Grade I (internal hemorrhoids that do not prolapse).       Estimated blood loss: none.      The exam was otherwise without abnormality on direct and retroflexion       views. Impression:            - Two 2 to 3 mm polyps  in the transverse colon and in                         the cecum, removed with a cold biopsy forceps.                         Resected and retrieved.                        - Diverticulosis in the entire examined colon.                        - Normal mucosa in the entire examined colon. Biopsied.                        - Non-bleeding internal hemorrhoids.                        - The examination was otherwise normal on direct and                         retroflexion views. Recommendation:        - Discharge patient to home.                        - Resume previous diet.                        - Continue present medications.                        - Resume Coumadin (warfarin) at prior dose today.                          Refer to managing physician for further adjustment of                         therapy.                        - Await pathology results.                        - Repeat colonoscopy not indicated as patient older                         than screening age.                        - Return to referring physician as previously                         scheduled. Procedure Code(s):     --- Professional ---                        [phone], Colonoscopy, flexible; with biopsy, single or                         multiple Diagnosis Code(s):     --- Professional ---  K63.5, Polyp of colon                        K64.0, First degree hemorrhoids                        K52.9, Noninfective gastroenteritis and colitis,                         unspecified                        K57.30, Diverticulosis of large intestine without                         perforation or abscess without bleeding CPT copyright 2019 [employer] Association. All rights reserved. The codes documented in this report are preliminary and upon coder review may  be revised to meet current compliance requirements. Attending Participation:      I personally performed the entire procedure. Volney American, DO [name] DO, DO 09/25/2021 9:04:16 AM This report has been signed electronically. Number of Addenda: 0 Note Initiated On: 09/25/2021 8:23 AM Scope Withdrawal Time: 0 hours 15 minutes 19 seconds  Total Procedure Duration: 0 hours 22 minutes 22 seconds  Estimated Blood Loss:  Estimated blood loss was minimal.      [facility]

## 2021-09-25 NOTE — Anesthesia Postprocedure Evaluation (Signed)
Anesthesia Post Note  Patient: Collin Knight  Procedure(s) Performed: COLONOSCOPY WITH PROPOFOL  Patient location during evaluation: PACU Anesthesia Type: General Level of consciousness: awake and alert Pain management: pain level controlled Vital Signs Assessment: post-procedure vital signs reviewed and stable Respiratory status: spontaneous breathing, nonlabored ventilation, respiratory function stable and patient connected to nasal cannula oxygen Cardiovascular status: blood pressure returned to baseline and stable Postop Assessment: no apparent nausea or vomiting Anesthetic complications: no   No notable events documented.   Last Vitals:  Vitals:   09/25/21 0922 09/25/21 0932  BP: (!) 123/99 (!) 128/97  Pulse: (!) 131 94  Resp: 20 (!) 22  Temp:    SpO2: 97% 96%    Last Pain:  Vitals:   09/25/21 0932  TempSrc:   PainSc: 0-No pain                 Molli Barrows

## 2021-09-25 NOTE — H&P (Signed)
Pre-Procedure H&P   Patient ID: Collin Knight is a 75 y.o. male.  Gastroenterology Provider: [name], DO  Referring Provider: Laurine Blazer, PA PCP: [name], MD  Date: 09/25/2021  HPI Mr. Collin Knight is a 75 y.o. male who presents today for Colonoscopy for presenting with abdominal pain and change in bowel habits.  Bowel changes with daily diarrhea from 3 to 9 stools. A lot of the time he will notice "little pebble" stools that sink. Has grown worse over the last couple months, but has been ongoing for 2 years. No blood or melena.  No change in abdominal pain with BM.  Hgb 14.6  Four 12 oz beers daily. B12 low at 200.  Underwent colonoscopy in [facility] 5 years ago with patient reported polyps.  S/p ppm. On oac- held for procedure. Low risk for procedure per cardiology.   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  HENT:  Negative for trouble swallowing and voice change.   Respiratory:  Negative for shortness of breath and wheezing.   Cardiovascular:  Negative for chest pain, palpitations and leg swelling.  Gastrointestinal:  Positive for abdominal pain and diarrhea. Negative for abdominal distention, anal bleeding, blood in stool, constipation, nausea and vomiting.  Musculoskeletal:  Negative for arthralgias and myalgias.  Skin:  Negative for color change and pallor.  Neurological:  Negative for dizziness, syncope and weakness.  Psychiatric/Behavioral:   Negative for confusion. The patient is not nervous/anxious.   All other systems reviewed and are negative.   Medications No current facility-administered medications on file prior to encounter.   Current Outpatient Medications on File Prior to Encounter  Medication Sig Dispense Refill   diazepam (VALIUM) 5 MG tablet Take 5 mg by mouth 2 (two) times daily as needed.     metoprolol succinate (TOPROL-XL) 25 MG 24 hr tablet Take 25 mg by mouth daily.     sertraline (ZOLOFT) 25 MG tablet Take 25 mg by mouth daily.     apixaban (ELIQUIS) 5 MG TABS tablet Take 1 tablet (5 mg total) by mouth 2 (two) times daily. 60 tablet 0    Pertinent medications related to GI and procedure were reviewed by me with the patient prior to the procedure   Current Facility-Administered Medications:    0.9 %  sodium chloride infusion, , Intravenous, Continuous, [name], DO, Last Rate: 20 mL/hr at 09/25/21 0805, New Bag at 09/25/21 0805  sodium chloride 20 mL/hr at 09/25/21 0805       No Known Allergies Allergies were reviewed by me prior to the procedure  Objective    Vitals:   09/25/21 0751  BP: (!) 126/103  Pulse: 74  Resp: 16  Temp: (!) 96.2 F (35.7 C)  TempSrc: Temporal  SpO2: 96%  Weight: 65.3 kg  Height: 5' 10.5" (1.791 m)     Physical Exam Vitals and nursing note reviewed.  Constitutional:      General: He is not in acute distress.  Appearance: Normal appearance. He is not ill-appearing, toxic-appearing or diaphoretic.  HENT:     Head: Normocephalic and atraumatic.     Ears:     Comments: HOH    Nose: Nose normal.     Mouth/Throat:     Mouth: Mucous membranes are moist.     Pharynx: Oropharynx is clear.  Eyes:     General: No scleral icterus.    Extraocular Movements: Extraocular movements intact.  Cardiovascular:     Rate and Rhythm: Normal rate and regular rhythm.     Heart sounds: Normal heart sounds. No murmur heard.   No friction rub. No gallop.   Pulmonary:     Effort: Pulmonary effort is normal. No respiratory distress.     Breath sounds: Normal breath sounds. No wheezing, rhonchi or rales.     Comments: Barrel chested Abdominal:     General: Abdomen is flat. Bowel sounds are normal. There is no distension.     Palpations: Abdomen is soft.     Tenderness: There is no abdominal tenderness. There is no guarding or rebound.  Musculoskeletal:     Cervical back: Neck supple.     Right lower leg: No edema.     Left lower leg: No edema.  Skin:    General: Skin is warm and dry.     Coloration: Skin is not jaundiced or pale.  Neurological:     General: No focal deficit present.     Mental Status: He is alert and oriented to person, place, and time. Mental status is at baseline.  Psychiatric:        Mood and Affect: Mood normal.        Behavior: Behavior normal.        Thought Content: Thought content normal.        Judgment: Judgment normal.     Assessment:  Mr. Collin Knight is a 75 y.o. male  who presents today for Colonoscopy for change in bowel habits (diarrhea) and abdominal pain.  Plan:  Colonoscopy with possible intervention today  Colonoscopy with possible biopsy, control of bleeding, polypectomy, and interventions as necessary has been discussed with the patient/patient representative. Informed consent was obtained from the patient/patient representative after explaining the indication, nature, and risks of the procedure including but not limited to death, bleeding, perforation, missed neoplasm/lesions, cardiorespiratory compromise, and reaction to medications. Opportunity for questions was given and appropriate answers were provided. Patient/patient representative has verbalized understanding is amenable to undergoing the procedure.   [name], DO  [facility]astro[facility]  Portions of the record may have been created with voice recognition software. Occasional wrong-word or 'sound-a-like'  substitutions may have occurred due to the inherent limitations of voice recognition software.  Read the chart carefully and recognize, using context, where substitutions may have occurred.

## 2021-09-25 NOTE — Transfer of Care (Signed)
Immediate Anesthesia Transfer of Care Note  Patient: Phyllis Whitefield  Procedure(s) Performed: COLONOSCOPY WITH PROPOFOL  Patient Location: PACU  Anesthesia Type:General  Level of Consciousness: awake and sedated  Airway & Oxygen Therapy: Patient Spontanous Breathing and Patient connected to nasal cannula oxygen  Post-op Assessment: Report given to RN and Post -op Vital signs reviewed and stable  Post vital signs: Reviewed and stable  Last Vitals:  Vitals Value Taken Time  BP    Temp    Pulse    Resp    SpO2      Last Pain:  Vitals:   09/25/21 0751  TempSrc: Temporal         Complications: No notable events documented.

## 2021-09-25 NOTE — Anesthesia Procedure Notes (Signed)
Date/Time: 09/25/2021 8:38 AM Performed by: Vaughan Sine Pre-anesthesia Checklist: Patient identified, Emergency Drugs available, Suction available, Patient being monitored and Timeout performed Oxygen Delivery Method: Nasal cannula Preoxygenation: Pre-oxygenation with 100% oxygen Induction Type: IV induction Placement Confirmation: positive ETCO2 and CO2 detector

## 2021-09-26 ENCOUNTER — Encounter: Payer: Self-pay | Admitting: Gastroenterology

## 2021-09-26 LAB — SURGICAL PATHOLOGY

## 2021-11-21 ENCOUNTER — Inpatient Hospital Stay
Admission: EM | Admit: 2021-11-21 | Discharge: 2021-11-25 | DRG: 308 | Disposition: A | Discharge status: Home Health | Payer: Medicare Other | Attending: Internal Medicine | Admitting: Internal Medicine

## 2021-11-21 ENCOUNTER — Emergency Department: Payer: Medicare Other

## 2021-11-21 ENCOUNTER — Other Ambulatory Visit: Payer: Self-pay

## 2021-11-21 DIAGNOSIS — I480 Paroxysmal atrial fibrillation: Principal | ICD-10-CM | POA: Diagnosis present

## 2021-11-21 DIAGNOSIS — I110 Hypertensive heart disease with heart failure: Secondary | ICD-10-CM | POA: Diagnosis not present

## 2021-11-21 DIAGNOSIS — I4891 Unspecified atrial fibrillation: Secondary | ICD-10-CM | POA: Diagnosis present

## 2021-11-21 DIAGNOSIS — F419 Anxiety disorder, unspecified: Secondary | ICD-10-CM | POA: Diagnosis present

## 2021-11-21 DIAGNOSIS — J9811 Atelectasis: Secondary | ICD-10-CM | POA: Diagnosis present

## 2021-11-21 DIAGNOSIS — J9601 Acute respiratory failure with hypoxia: Secondary | ICD-10-CM | POA: Diagnosis present

## 2021-11-21 DIAGNOSIS — R52 Pain, unspecified: Secondary | ICD-10-CM

## 2021-11-21 DIAGNOSIS — R531 Weakness: Secondary | ICD-10-CM | POA: Diagnosis present

## 2021-11-21 DIAGNOSIS — Z20822 Contact with and (suspected) exposure to covid-19: Secondary | ICD-10-CM | POA: Diagnosis present

## 2021-11-21 DIAGNOSIS — I2510 Atherosclerotic heart disease of native coronary artery without angina pectoris: Secondary | ICD-10-CM | POA: Diagnosis present

## 2021-11-21 DIAGNOSIS — Z7901 Long term (current) use of anticoagulants: Secondary | ICD-10-CM

## 2021-11-21 DIAGNOSIS — R0602 Shortness of breath: Secondary | ICD-10-CM | POA: Diagnosis present

## 2021-11-21 DIAGNOSIS — F1020 Alcohol dependence, uncomplicated: Secondary | ICD-10-CM | POA: Diagnosis present

## 2021-11-21 DIAGNOSIS — Z79899 Other long term (current) drug therapy: Secondary | ICD-10-CM

## 2021-11-21 DIAGNOSIS — Z9981 Dependence on supplemental oxygen: Secondary | ICD-10-CM

## 2021-11-21 DIAGNOSIS — R791 Abnormal coagulation profile: Secondary | ICD-10-CM | POA: Diagnosis present

## 2021-11-21 DIAGNOSIS — Z87891 Personal history of nicotine dependence: Secondary | ICD-10-CM

## 2021-11-21 DIAGNOSIS — I5021 Acute systolic (congestive) heart failure: Secondary | ICD-10-CM | POA: Diagnosis not present

## 2021-11-21 DIAGNOSIS — Z950 Presence of cardiac pacemaker: Secondary | ICD-10-CM

## 2021-11-21 DIAGNOSIS — F1010 Alcohol abuse, uncomplicated: Secondary | ICD-10-CM | POA: Diagnosis present

## 2021-11-21 LAB — HEPATIC FUNCTION PANEL
ALT: 28 U/L (ref 0–44)
AST: 25 U/L (ref 15–41)
Albumin: 3.2 g/dL — ABNORMAL LOW (ref 3.5–5.0)
Alkaline Phosphatase: 47 U/L (ref 38–126)
Bilirubin, Direct: 0.2 mg/dL (ref 0.0–0.2)
Indirect Bilirubin: 0.8 mg/dL (ref 0.3–0.9)
Total Bilirubin: 1 mg/dL (ref 0.3–1.2)
Total Protein: 5.8 g/dL — ABNORMAL LOW (ref 6.5–8.1)

## 2021-11-21 LAB — BASIC METABOLIC PANEL
Anion gap: 9 (ref 5–15)
BUN: 23 mg/dL (ref 8–23)
CO2: 18 mmol/L — ABNORMAL LOW (ref 22–32)
Calcium: 8.8 mg/dL — ABNORMAL LOW (ref 8.9–10.3)
Chloride: 108 mmol/L (ref 98–111)
Creatinine, Ser: 0.96 mg/dL (ref 0.61–1.24)
GFR, Estimated: >60 mL/min (ref >60)
Glucose, Bld: 111 mg/dL — ABNORMAL HIGH (ref 70–99)
Potassium: 4.3 mmol/L (ref 3.5–5.1)
Sodium: 135 mmol/L (ref 135–145)

## 2021-11-21 LAB — CBC
HCT: 45.5 % (ref 39.0–52.0)
Hemoglobin: 15.3 g/dL (ref 13.0–17.0)
MCH: 35.6 pg — ABNORMAL HIGH (ref 26.0–34.0)
MCHC: 33.6 g/dL (ref 30.0–36.0)
MCV: 105.8 fL — ABNORMAL HIGH (ref 80.0–100.0)
Platelets: 334 10*3/uL (ref 150–400)
RBC: 4.3 MIL/uL (ref 4.22–5.81)
RDW: 14.2 % (ref 11.5–15.5)
WBC: 8.3 10*3/uL (ref 4.0–10.5)
nRBC: 0 % (ref 0.0–0.2)

## 2021-11-21 LAB — TROPONIN I (HIGH SENSITIVITY)
Troponin I (High Sensitivity): 69 ng/L — ABNORMAL HIGH (ref <18)
Troponin I (High Sensitivity): 66 ng/L — ABNORMAL HIGH (ref <18)

## 2021-11-21 LAB — D-DIMER, QUANTITATIVE: D-Dimer, Quant: 0.39 ug/mL-FEU (ref 0.00–0.50)

## 2021-11-21 LAB — PROTIME-INR
INR: 1.8 — ABNORMAL HIGH (ref 0.8–1.2)
Prothrombin Time: 21.3 seconds — ABNORMAL HIGH (ref 11.4–15.2)

## 2021-11-21 LAB — MAGNESIUM
Magnesium: 2 mg/dL (ref 1.7–2.4)
Magnesium: 1.9 mg/dL (ref 1.7–2.4)

## 2021-11-21 LAB — TSH: TSH: 1.201 u[IU]/mL (ref 0.350–4.500)

## 2021-11-21 LAB — RESP PANEL BY RT-PCR (FLU A&B, COVID) ARPGX2
Influenza A by PCR: NEGATIVE
Influenza B by PCR: NEGATIVE
SARS Coronavirus 2 by RT PCR: NEGATIVE

## 2021-11-21 LAB — PROCALCITONIN: Procalcitonin: <0.1 ng/mL

## 2021-11-21 LAB — PHOSPHORUS: Phosphorus: 4 mg/dL (ref 2.5–4.6)

## 2021-11-21 LAB — ETHANOL: Alcohol, Ethyl (B): <10 mg/dL (ref <10)

## 2021-11-21 MED ORDER — FOLIC ACID 1 MG PO TABS
1.0000 mg | ORAL_TABLET | Freq: Every day | ORAL | Status: DC
  Administered 2021-11-21 – 2021-11-25 (×5): 1 mg via ORAL
  Filled 2021-11-21 (×5): qty 1

## 2021-11-21 MED ORDER — SODIUM CHLORIDE 0.9 % IV BOLUS
1000.0000 mL | Freq: Once | INTRAVENOUS | Status: AC
  Administered 2021-11-21: 1000 mL via INTRAVENOUS

## 2021-11-21 MED ORDER — WARFARIN SODIUM 6 MG PO TABS
6.0000 mg | ORAL_TABLET | Freq: Once | ORAL | Status: AC
  Administered 2021-11-22: 6 mg via ORAL
  Filled 2021-11-21: qty 1

## 2021-11-21 MED ORDER — THIAMINE HCL 100 MG PO TABS
100.0000 mg | ORAL_TABLET | Freq: Every day | ORAL | Status: DC
  Administered 2021-11-21 – 2021-11-25 (×5): 100 mg via ORAL
  Filled 2021-11-21 (×5): qty 1

## 2021-11-21 MED ORDER — LORAZEPAM 1 MG PO TABS: 1.0000 mg | ORAL_TABLET | ORAL | Status: AC | PRN

## 2021-11-21 MED ORDER — DILTIAZEM HCL-DEXTROSE 125-5 MG/125ML-% IV SOLN (PREMIX)
5.0000 mg/h | INTRAVENOUS | Status: DC
  Administered 2021-11-21: 5 mg/h via INTRAVENOUS
  Administered 2021-11-22: 10 mg/h via INTRAVENOUS
  Administered 2021-11-22: 7.5 mg/h via INTRAVENOUS
  Filled 2021-11-21: qty 500
  Filled 2021-11-21: qty 125

## 2021-11-21 MED ORDER — LORAZEPAM 2 MG/ML IJ SOLN: 2.0000 mg | INTRAMUSCULAR | Status: DC | PRN

## 2021-11-21 MED ORDER — DILTIAZEM HCL 25 MG/5ML IV SOLN
20.0000 mg | Freq: Once | INTRAVENOUS | Status: AC
Start: 2021-11-21 — End: 2021-11-21
  Administered 2021-11-21: 20 mg via INTRAVENOUS
  Filled 2021-11-21: qty 5

## 2021-11-21 MED ORDER — LORAZEPAM 2 MG/ML IJ SOLN: 1.0000 mg | INTRAMUSCULAR | Status: AC | PRN

## 2021-11-21 MED ORDER — ONDANSETRON HCL 4 MG PO TABS: 4.0000 mg | ORAL_TABLET | Freq: Four times a day (QID) | ORAL | Status: DC | PRN

## 2021-11-21 MED ORDER — ACETAMINOPHEN 650 MG RE SUPP: 650.0000 mg | Freq: Four times a day (QID) | RECTAL | Status: DC | PRN

## 2021-11-21 MED ORDER — WARFARIN - PHARMACIST DOSING INPATIENT
Freq: Every day | Status: DC
  Filled 2021-11-21: qty 1

## 2021-11-21 MED ORDER — ACETAMINOPHEN 325 MG PO TABS: 650.0000 mg | ORAL_TABLET | Freq: Four times a day (QID) | ORAL | Status: DC | PRN

## 2021-11-21 MED ORDER — ADULT MULTIVITAMIN W/MINERALS CH
1.0000 | ORAL_TABLET | Freq: Every day | ORAL | Status: DC
  Administered 2021-11-21 – 2021-11-25 (×5): 1 via ORAL
  Filled 2021-11-21 (×5): qty 1

## 2021-11-21 MED ORDER — BUDESONIDE 3 MG PO CPEP
6.0000 mg | ORAL_CAPSULE | Freq: Every day | ORAL | Status: DC
  Administered 2021-11-22 – 2021-11-25 (×4): 6 mg via ORAL
  Filled 2021-11-21 (×4): qty 2

## 2021-11-21 MED ORDER — METOPROLOL SUCCINATE ER 50 MG PO TB24: 25.0000 mg | ORAL_TABLET | Freq: Every day | ORAL | Status: DC

## 2021-11-21 MED ORDER — DIAZEPAM 5 MG PO TABS: 5.0000 mg | ORAL_TABLET | Freq: Two times a day (BID) | ORAL | Status: DC | PRN

## 2021-11-21 MED ORDER — THIAMINE HCL 100 MG/ML IJ SOLN: 100.0000 mg | Freq: Every day | INTRAMUSCULAR | Status: DC

## 2021-11-21 MED ORDER — ONDANSETRON HCL 4 MG/2ML IJ SOLN: 4.0000 mg | Freq: Four times a day (QID) | INTRAMUSCULAR | Status: DC | PRN

## 2021-11-21 NOTE — H&P (Signed)
History and Physical   Collin Knight QBH:[id number] DOB: 03-03-1946 DOA: 11/21/2021  PCP: Collin Pink, MD  Outpatient Specialists: Dr. Volney American, GI Patient coming from: home  I have personally briefly reviewed patient's old medical records in [place].  Chief Concern: Shortness of breath and fatigue  HPI: 76 year old male with history of hypertension, anxiety, mild alcohol use disorder, history of CAD, atrial fibrillation, on anticoagulation with warfarin, who presents emergency department for chief concerns of shortness of breath and fatigue.  Initial vitals in the emergency department showed temperature of 97.6, increased to 98.2, initial respiration rate was 28 and improved to 21, heart rate was 157 and increased to 167, blood pressure 130/109, SPO2 of 96% on room air.  Serum sodium was 135, potassium 4.3, chloride of 108, bicarb 18, BUN of 23, serum creatinine of 0.96, nonfasting blood glucose 111, GFR greater than 60.  High sensitive troponin was 69 and decreased to 66.  Magnesium was 2.0.  Alk phos was 47.  WBC was 8.3, hemoglobin 15.3, platelets of 334.  D-dimer was 0.39.  Patient had elevated PT and INR.  In the emergency department patient was given sodium chloride 1 L bolus and started on diltiazem gtt.  At bedside, he is able to tell me his name, age, current calendar year, and current location.   He presents today for chief concern of fatigue and shortness of breath. He has never felt this way before. He denies any new changes.  He drinks daily if he can.  At baseline, he drinks about three cans of twelve oz beers per day.  He states that in the last few days when he has been short therefore he has only been making 2 with 1 or 2 cans of beer per day.  He denies any chest pain, trauma, fever, new cough, unintentional weight changes, shortness of breath, abdominal pain, dysuria, hematuria, diarrhea, constipation.  Social history: He lives with his spouse. He is a  former tobacco user and quit 6 years ago. At his peak, he smoked one pack of tobacco (piping) per week. He is a daily etoh user, at least three twelve oz cans per day or three 16 oz per day.  He denies recreational drug use.  He is retired and formerly worked for an [employer], manufacturing.   Vaccination history: He is vaccinated for covid and flu.   ROS: Constitutional: no weight change, no fever ENT/Mouth: no sore throat, no rhinorrhea Eyes: no eye pain, no vision changes Cardiovascular: no chest pain, no dyspnea,  no edema, no palpitations Respiratory: no cough, no sputum, no wheezing Gastrointestinal: no nausea, no vomiting, no diarrhea, no constipation Genitourinary: no urinary incontinence, no dysuria, no hematuria Musculoskeletal: no arthralgias, no myalgias Skin: no skin lesions, no pruritus, Neuro: + weakness, + fatigue, no loss of consciousness, no syncope Psych: no anxiety, no depression, no decrease appetite Heme/Lymph: no bruising, no bleeding  ED Course: Discussed with emergency medicine provider, patient requiring hospitalization for chief concerns of atrial fibrillation with RVR.  Assessment/Plan  Principal Problem:   Atrial fibrillation with RVR (HCC) Active Problems:   Anxiety   Alcohol use disorder, mild, abuse   Shortness of breath   On warfarin therapy    Cardiovascular and Mediastinum * Atrial fibrillation with RVR (HCC) Assessment & Plan - Etiology work-up in progress - Check ethanol level, procalcitonin, B1, B12, TSH, check BNP - Continue diltiazem GGT - Admit to progressive cardiac unit, with telemetry  Other On warfarin  therapy Assessment & Plan - Warfarin per pharmacy ordered for atrial fibrillation  Shortness of breath Assessment & Plan - Presumed secondary to atrial fibrillation with RVR - Treat as above  Alcohol use disorder, mild, abuse Assessment & Plan - His last drink was Monday, 11/20/2021, 1 beer - CIWA protocol  ordered  Anxiety Assessment & Plan - Ativan per CIWA protocol ordered  Chart reviewed.   DVT prophylaxis: Warfarin per pharmacy Code Status: Full code Diet: Heart healthy Family Communication: Updated spouse at bedside Disposition Plan: Pending clinical course Consults called: None at this time Admission status: Progressive cardiac, observation, telemetry  Past Medical History:  Diagnosis Date   Anxiety    Past Surgical History:  Procedure Laterality Date   COLONOSCOPY WITH PROPOFOL N/A 09/25/2021   Procedure: COLONOSCOPY WITH PROPOFOL;  Surgeon: [name], DO;  Location: [facility];  Service: Gastroenterology;  Laterality: N/A;   EYE SURGERY     cataract both eyes   PACEMAKER LEADLESS INSERTION N/A 09/30/2020   Procedure: PACEMAKER LEADLESS INSERTION;  Surgeon: Isaias Cowman, MD;  Location: [facility];  Service: Cardiovascular;  Laterality: N/A;   Social History:  reports that he has never smoked. He has never used smokeless tobacco. He reports current alcohol use of about 21.0 standard drinks per week. He reports that he does not use drugs.  No Known Allergies No family history on file. Family history: Family history reviewed and not pertinent.  Prior to Admission medications   Medication Sig Start Date End Date Taking? Authorizing Provider  budesonide (ENTOCORT EC) 3 MG 24 hr capsule Take 6-9 mg by mouth daily. 10/30/21 10/30/22 Yes [provider]  apixaban (ELIQUIS) 5 MG TABS tablet Take 1 tablet (5 mg total) by mouth 2 (two) times daily. 10/01/20 09/25/21  Darliss Cheney, MD  diazepam (VALIUM) 5 MG tablet Take 5 mg by mouth 2 (two) times daily as needed. 09/13/20   [provider]  metoprolol succinate (TOPROL-XL) 25 MG 24 hr tablet Take 25 mg by mouth daily.    [provider]  sertraline (ZOLOFT) 25 MG tablet Take 25 mg by mouth daily. 09/06/20   [provider]   Physical Exam: Vitals:   11/21/21 1525  11/21/21 1733 11/21/21 1900 11/21/21 1919  BP:  (!) 109/96 (!) 134/100 119/82  Pulse: (!) 119 100 85   Resp: (!) 28 20 (!) 24   Temp:  98.2 F (36.8 C)    TempSrc:  Oral    SpO2: 96% 93% 93% 99%  Weight:      Height:       Constitutional: appears age-appropriate, NAD, calm, comfortable Eyes: PERRL, lids and conjunctivae normal ENMT: Mucous membranes are moist. Posterior pharynx clear of any exudate or lesions. Age-appropriate dentition. Hearing appropriate Neck: normal, supple, no masses, no thyromegaly Respiratory: clear to auscultation bilaterally, no wheezing, no crackles. Normal respiratory effort. No accessory muscle use.  Cardiovascular: Regular rate and rhythm, no murmurs / rubs / gallops. No extremity edema. 2+ pedal pulses. No carotid bruits. Mild pectus carinatum Abdomen: no tenderness, no masses palpated, no hepatosplenomegaly. Bowel sounds positive.  Musculoskeletal: no clubbing / cyanosis. No joint deformity upper and lower extremities. Good ROM, no contractures, no atrophy. Normal muscle tone.  Skin: no rashes, lesions, ulcers. No induration Neurologic: Sensation intact. Strength 5/5 in all 4.  Psychiatric: Normal judgment and insight. Alert and oriented x 3. Normal mood.   EKG: independently reviewed, showing atrial fibrillation with RVR, rate of 167, QTc 480  Chest x-ray on Admission: I personally reviewed and I agree with radiologist reading as below.  DG Chest Port 1 View  Result Date: 11/21/2021 CLINICAL DATA:  Chest pain, shortness of breath. EXAM: PORTABLE CHEST 1 VIEW COMPARISON:  September 29, 2020. FINDINGS: Stable cardiomediastinal silhouette. Mild bibasilar subsegmental atelectasis or edema is noted with probable small bilateral pleural effusions. The visualized skeletal structures are unremarkable. IMPRESSION: Mild bibasilar subsegmental atelectasis or edema is noted with probable small bilateral pleural effusions. Electronically Signed   By: Marijo Conception M.D.    On: 11/21/2021 15:32    Labs on Admission: I have personally reviewed following labs  CBC: Recent Labs  Lab 11/21/21 1504  WBC 8.3  HGB 15.3  HCT 45.5  MCV 105.8*  PLT 129   Basic Metabolic Panel: Recent Labs  Lab 11/21/21 1504 11/21/21 1650  NA 135  --   K 4.3  --   CL 108  --   CO2 18*  --   GLUCOSE 111*  --   BUN 23  --   CREATININE 0.96  --   CALCIUM 8.8*  --   MG  --  2.0   GFR: Estimated Creatinine Clearance: 65.5 mL/min (by C-G formula based on SCr of 0.96 mg/dL).  Liver Function Tests: Recent Labs  Lab 11/21/21 1650  AST 25  ALT 28  ALKPHOS 47  BILITOT 1.0  PROT 5.8*  ALBUMIN 3.2*   Coagulation Profile: Recent Labs  Lab 11/21/21 1650  INR 1.8*   Dr. Tobie Poet [facility]  If 7PM-7AM, please contact overnight-coverage provider If 7AM-7PM, please contact day coverage provider [email or web address]  11/21/2021, 8:24 PM

## 2021-11-21 NOTE — Assessment & Plan Note (Signed)
-   Presumed secondary to atrial fibrillation with RVR - Treat as above

## 2021-11-21 NOTE — Assessment & Plan Note (Signed)
-   His last drink was Monday, 11/20/2021, 1 beer - CIWA protocol ordered

## 2021-11-21 NOTE — ED Notes (Signed)
Pts oxygen drops down to 97% when resting. Pt placed on 2 liters nasal cannula

## 2021-11-21 NOTE — Hospital Course (Addendum)
76 year old male with history of hypertension, anxiety, mild alcohol use disorder, history of CAD, atrial fibrillation, on anticoagulation with warfarin, who presents emergency department for chief concerns of shortness of breath and fatigue.  Initial vitals in the emergency department showed temperature of 97.6, increased to 98.2, initial respiration rate was 28 and improved to 21, heart rate was 157 and increased to 167, blood pressure 130/109, SPO2 of 96% on room air.  Serum sodium was 135, potassium 4.3, chloride of 108, bicarb 18, BUN of 23, serum creatinine of 0.96, nonfasting blood glucose 111, GFR greater than 60.  High sensitive troponin was 69 and decreased to 66.  Magnesium was 2.0.  Alk phos was 47.  WBC was 8.3, hemoglobin 15.3, platelets of 334.  D-dimer was 0.39.  Patient had elevated PT and INR.  In the emergency department patient was given sodium chloride 1 L bolus and started on diltiazem gtt.

## 2021-11-21 NOTE — Assessment & Plan Note (Addendum)
-   Ativan per CIWA protocol ordered

## 2021-11-21 NOTE — Assessment & Plan Note (Addendum)
-   Etiology work-up in progress - Check ethanol level, procalcitonin, B1, B12, TSH, check BNP - Continue diltiazem GGT - Admit to progressive cardiac unit, with telemetry

## 2021-11-21 NOTE — ED Triage Notes (Signed)
Pt c/o left sided chest pain with SOB and fatigue since last Tuesday. Pt is in NAD on arrival

## 2021-11-21 NOTE — ED Provider Notes (Signed)
[facility] Provider Note    Event Date/Time   First MD Initiated Contact with Patient 11/21/21 1516     (approximate)   History   Chest Pain and Shortness of Breath   HPI  Collin Knight is a 76 y.o. male with history of shortness of breath, a fib, anxiety, alcohol use and as listed in EMR presents to the emergency department for treatment and evaluation of left side chest pain with shortness of breath and fatigue  with palpitations.  Symptoms started about a week ago.      Physical Exam   Triage Vital Signs: ED Triage Vitals  Enc Vitals Group     BP 11/21/21 1510 (!) 130/109     Pulse Rate 11/21/21 1505 (!) 167     Resp 11/21/21 1510 (!) 28     Temp 11/21/21 1504 97.6 F (36.4 C)     Temp src --      SpO2 11/21/21 1510 92 %     Weight 11/21/21 1505 165 lb (74.8 kg)     Height 11/21/21 1505 _0  (1.778 m)     Head Circumference --      Peak Flow --      Pain Score 11/21/21 1505 3     Pain Loc --      Pain Edu? --      Excl. in [place]? --     Most recent vital signs: Vitals:   11/21/21 1900 11/21/21 1919  BP: (!) 134/100 119/82  Pulse: 85   Resp: (!) 24   Temp:    SpO2: 93% 99%    General: Awake, no distress.  CV:  Good peripheral perfusion.  Resp:  Normal effort.  Abd:  No distention.  Other:     ED Results / Procedures / Treatments   Labs (all labs ordered are listed, but only abnormal results are displayed) Labs Reviewed  BASIC METABOLIC PANEL - Abnormal; Notable for the following components:      Result Value   CO2 18 (*)    Glucose, Bld 111 (*)    Calcium 8.8 (*)    All other components within normal limits  CBC - Abnormal; Notable for the following components:   MCV 105.8 (*)    MCH 35.6 (*)    All other components within normal limits  HEPATIC FUNCTION PANEL - Abnormal; Notable for the following components:   Total Protein 5.8 (*)    Albumin 3.2 (*)    All other components within normal limits  PROTIME-INR -  Abnormal; Notable for the following components:   Prothrombin Time 21.3 (*)    INR 1.8 (*)    All other components within normal limits  TROPONIN I (HIGH SENSITIVITY) - Abnormal; Notable for the following components:   Troponin I (High Sensitivity) 69 (*)    All other components within normal limits  TROPONIN I (HIGH SENSITIVITY) - Abnormal; Notable for the following components:   Troponin I (High Sensitivity) 66 (*)    All other components within normal limits  RESP PANEL BY RT-PCR (FLU A&B, COVID) ARPGX2  MAGNESIUM  D-DIMER, QUANTITATIVE (NOT AT [facility])  MAGNESIUM  PHOSPHORUS  TSH  PROCALCITONIN  ETHANOL  BASIC METABOLIC PANEL  CBC  VITAMIN B12  VITAMIN B1  PROTIME-INR  BRAIN NATRIURETIC PEPTIDE     EKG  ED ECG REPORT I, Mariama Saintvil, FNP-BC personally viewed and interpreted this ECG.   Date: 11/21/2021  EKG Time: 1509  Rate: 167  Rhythm: atrial fibrillation, rate uncontrolled  Axis: normal  Intervals:none  ST&T Change: no ST elevation    RADIOLOGY  Image and radiology report reviewed by me.  Chest x-ray without acute cardiopulmonary abnormality.  PROCEDURES:  Critical Care performed: Yes, see critical care procedure note(s)  Procedures   MEDICATIONS ORDERED IN ED: Medications  diltiazem (CARDIZEM) 125 mg in dextrose 5% 125 mL (1 mg/mL) infusion (5 mg/hr Intravenous New Bag/Given 11/21/21 1705)  acetaminophen (TYLENOL) tablet 650 mg (has no administration in time range)    Or  acetaminophen (TYLENOL) suppository 650 mg (has no administration in time range)  ondansetron (ZOFRAN) tablet 4 mg (has no administration in time range)    Or  ondansetron (ZOFRAN) injection 4 mg (has no administration in time range)  warfarin (COUMADIN) tablet 6 mg (has no administration in time range)  Warfarin - Pharmacist Dosing Inpatient (has no administration in time range)  LORazepam (ATIVAN) tablet 1-4 mg (has no administration in time range)    Or  LORazepam (ATIVAN)  injection 1-4 mg (has no administration in time range)  thiamine tablet 100 mg (has no administration in time range)    Or  thiamine (B-1) injection 100 mg (has no administration in time range)  folic acid (FOLVITE) tablet 1 mg (has no administration in time range)  multivitamin with minerals tablet 1 tablet (has no administration in time range)  metoprolol succinate (TOPROL-XL) 24 hr tablet 25 mg (has no administration in time range)  diazepam (VALIUM) tablet 5 mg (has no administration in time range)  budesonide (ENTOCORT EC) 24 hr capsule 6-9 mg (has no administration in time range)  diltiazem (CARDIZEM) injection 20 mg (20 mg Intravenous Given 11/21/21 1649)  sodium chloride 0.9 % bolus 1,000 mL (1,000 mLs Intravenous New Bag/Given 11/21/21 1650)     IMPRESSION / MDM / ASSESSMENT AND PLAN / ED COURSE  I reviewed the triage vital signs and the nursing notes.                              Differential diagnosis includes, but is not limited to, A. fib with RVR, CAD, MI, PE  The patient is on the cardiac monitor to evaluate for evidence of arrhythmia and/or significant heart rate changes.  76 year old male presenting to the emergency department for treatment and evaluation of left-sided chest pain with shortness of breath and fatigue that started about a week ago.  See HPI for further details.  Chart review shows that he was discharged on October 01, 2020 after similar symptoms.  At that time though, he was having some episodes of bradycardia as well as tachycardia with A. fib.  Today, there has been no bradycardia but his A. fib rate has been as high as 167.  Patient states that he has been compliant with his medications.  He is currently on warfarin.  PT/INR today is 21.3 and 1.8.  Troponin today is slightly elevated at 69 then 2-hour serial level was 66.  D-dimer is negative.  Magnesium level is normal.  AST, ALT, alk phos, bilirubin are all normal.  COVID and influenza testing is  negative.  CBC is normal.  Patient given bolus of Cardizem IV which brought his rate down to the 80s, however he was still in A. fib.  IV drip ordered to be titrated with close monitoring of blood pressure and heart rate.  Blood pressure is currently 109/96 with a heart rate of 100.  The patient remains awake, alert, oriented.  His wife is at bedside.  Plan will be to speak with on-call cardiology and admit him for further treatment and evaluation and likely medication adjustments.  Patient and family aware and agreeable to the plan.  Case discussed with Dr. Tobie Poet with hospitalist service who has accepted him for admission.      FINAL CLINICAL IMPRESSION(S) / ED DIAGNOSES   Final diagnoses:  Atrial fibrillation with rapid ventricular response ([place])     Rx / DC Orders   ED Discharge Orders     None        Note:  This document was prepared using Dragon voice recognition software and may include unintentional dictation errors.   Victorino Dike, FNP 11/21/21 2033    Lucrezia Starch, MD 11/21/21 2227

## 2021-11-21 NOTE — ED Notes (Signed)
Phlebotomy at bedside.

## 2021-11-21 NOTE — Consult Note (Signed)
ANTICOAGULATION CONSULT NOTE - Consult  Pharmacy Consult for Warfarin Indication: atrial fibrillation  No Known Allergies  Patient Measurements: Height: 5\' 10"  (177.8 cm) Weight: 74.8 kg (165 lb) IBW/kg (Calculated) : 73 Heparin Dosing Weight: 74.8kg  Vital Signs: Temp: 98.2 F (36.8 C) (01/31 1733) Temp Source: Oral (01/31 1733) BP: 119/82 (01/31 1919) Pulse Rate: 85 (01/31 1900)  Labs: Recent Labs    11/21/21 1504 11/21/21 1650  HGB 15.3  --   HCT 45.5  --   PLT 334  --   LABPROT  --  21.3*  INR  --  1.8*  CREATININE 0.96  --   TROPONINIHS 69* 66*    Estimated Creatinine Clearance: 65.5 mL/min (by C-G formula based on SCr of 0.96 mg/dL).   Medications:  PTA: Warfarin 5mg  QAM   Assessment: 76yo male w/ h/o HTN, anxiety, mild alcohol use disorder, history of CAD, atrial fibrillation (on [facility] with VKA), who presents emergency department for chief concerns of shortness of breath and fatigue. Pharmacy consulted for warfarin dosing.  Date Time INR Dose/Comment 1/31 0900 -- 5mg  dose taken 1/31 1650 1.8  Slightly subtherapeutic; will increase to 6mg  x2 days     Baseline Labs: Hgb - 15.3 Plts - 334 Trop 69>66   Goal of Therapy:  INR 2-3 Monitor platelets by anticoagulation protocol: Yes   Plan:  INR 1.8 (1/31 ~1700); last dose was 5mg  (1/31 0900 day of arrival). No DDI's currently present. Will plan to increase dose ~10% x2 days and trend INR response. Dose 6mg  x1 in AM 0900 2/01. CTM for DDI's and adjust PRN daily INRs.  Lorna Dibble, PharmD, [facility] Clinical Pharmacist 11/21/2021 7:44 PM

## 2021-11-21 NOTE — Assessment & Plan Note (Signed)
-   Warfarin per pharmacy ordered for atrial fibrillation

## 2021-11-22 ENCOUNTER — Observation Stay
Admit: 2021-11-22 | Discharge: 2021-11-22 | Disposition: A | Discharge status: Home / Self Care | Payer: Medicare Other | Attending: Internal Medicine | Admitting: Internal Medicine

## 2021-11-22 DIAGNOSIS — R791 Abnormal coagulation profile: Secondary | ICD-10-CM | POA: Diagnosis present

## 2021-11-22 DIAGNOSIS — R531 Weakness: Secondary | ICD-10-CM | POA: Diagnosis present

## 2021-11-22 DIAGNOSIS — Z7901 Long term (current) use of anticoagulants: Secondary | ICD-10-CM | POA: Diagnosis not present

## 2021-11-22 DIAGNOSIS — I110 Hypertensive heart disease with heart failure: Secondary | ICD-10-CM | POA: Diagnosis not present

## 2021-11-22 DIAGNOSIS — F419 Anxiety disorder, unspecified: Secondary | ICD-10-CM | POA: Diagnosis present

## 2021-11-22 DIAGNOSIS — Z9981 Dependence on supplemental oxygen: Secondary | ICD-10-CM | POA: Diagnosis not present

## 2021-11-22 DIAGNOSIS — F1020 Alcohol dependence, uncomplicated: Secondary | ICD-10-CM | POA: Diagnosis present

## 2021-11-22 DIAGNOSIS — I5021 Acute systolic (congestive) heart failure: Secondary | ICD-10-CM | POA: Diagnosis not present

## 2021-11-22 DIAGNOSIS — J9601 Acute respiratory failure with hypoxia: Secondary | ICD-10-CM | POA: Diagnosis present

## 2021-11-22 DIAGNOSIS — Z20822 Contact with and (suspected) exposure to covid-19: Secondary | ICD-10-CM | POA: Diagnosis present

## 2021-11-22 DIAGNOSIS — J9811 Atelectasis: Secondary | ICD-10-CM | POA: Diagnosis present

## 2021-11-22 DIAGNOSIS — Z87891 Personal history of nicotine dependence: Secondary | ICD-10-CM | POA: Diagnosis not present

## 2021-11-22 DIAGNOSIS — Z79899 Other long term (current) drug therapy: Secondary | ICD-10-CM | POA: Diagnosis not present

## 2021-11-22 DIAGNOSIS — I480 Paroxysmal atrial fibrillation: Secondary | ICD-10-CM | POA: Diagnosis present

## 2021-11-22 DIAGNOSIS — I4891 Unspecified atrial fibrillation: Secondary | ICD-10-CM | POA: Diagnosis present

## 2021-11-22 DIAGNOSIS — I2510 Atherosclerotic heart disease of native coronary artery without angina pectoris: Secondary | ICD-10-CM | POA: Diagnosis present

## 2021-11-22 DIAGNOSIS — Z950 Presence of cardiac pacemaker: Secondary | ICD-10-CM | POA: Diagnosis not present

## 2021-11-22 LAB — BRAIN NATRIURETIC PEPTIDE: B Natriuretic Peptide: 820.7 pg/mL — ABNORMAL HIGH (ref 0.0–100.0)

## 2021-11-22 LAB — BASIC METABOLIC PANEL
Anion gap: 12 (ref 5–15)
BUN: 20 mg/dL (ref 8–23)
CO2: 17 mmol/L — ABNORMAL LOW (ref 22–32)
Calcium: 8.5 mg/dL — ABNORMAL LOW (ref 8.9–10.3)
Chloride: 110 mmol/L (ref 98–111)
Creatinine, Ser: 0.84 mg/dL (ref 0.61–1.24)
GFR, Estimated: >60 mL/min (ref >60)
Glucose, Bld: 87 mg/dL (ref 70–99)
Potassium: 3.9 mmol/L (ref 3.5–5.1)
Sodium: 139 mmol/L (ref 135–145)

## 2021-11-22 LAB — CBC
HCT: 44.6 % (ref 39.0–52.0)
Hemoglobin: 15.1 g/dL (ref 13.0–17.0)
MCH: 35.9 pg — ABNORMAL HIGH (ref 26.0–34.0)
MCHC: 33.9 g/dL (ref 30.0–36.0)
MCV: 105.9 fL — ABNORMAL HIGH (ref 80.0–100.0)
Platelets: 321 10*3/uL (ref 150–400)
RBC: 4.21 MIL/uL — ABNORMAL LOW (ref 4.22–5.81)
RDW: 14.2 % (ref 11.5–15.5)
WBC: 9.9 10*3/uL (ref 4.0–10.5)
nRBC: 0 % (ref 0.0–0.2)

## 2021-11-22 LAB — ECHOCARDIOGRAM COMPLETE
AR max vel: 2.59 cm2
AV Area VTI: 2.82 cm2
AV Area mean vel: 2.7 cm2
AV Mean grad: 2 mmHg
AV Peak grad: 3.6 mmHg
Ao pk vel: 0.94 m/s
Area-P 1/2: 5.13 cm2
Height: 70 in
MV VTI: 1.49 cm2
S' Lateral: 4.33 cm
Weight: 2640 oz

## 2021-11-22 LAB — VITAMIN B12: Vitamin B-12: 1356 pg/mL — ABNORMAL HIGH (ref 180–914)

## 2021-11-22 LAB — PROTIME-INR
INR: 1.9 — ABNORMAL HIGH (ref 0.8–1.2)
Prothrombin Time: 22.1 seconds — ABNORMAL HIGH (ref 11.4–15.2)

## 2021-11-22 MED ORDER — METOPROLOL TARTRATE 25 MG PO TABS
25.0000 mg | ORAL_TABLET | Freq: Three times a day (TID) | ORAL | Status: DC
  Administered 2021-11-22 – 2021-11-23 (×4): 25 mg via ORAL
  Filled 2021-11-22 (×5): qty 1

## 2021-11-22 MED ORDER — ALPRAZOLAM 0.5 MG PO TABS
0.5000 mg | ORAL_TABLET | Freq: Three times a day (TID) | ORAL | Status: DC
  Administered 2021-11-22 – 2021-11-25 (×10): 0.5 mg via ORAL
  Filled 2021-11-22 (×10): qty 1

## 2021-11-22 MED ORDER — METOPROLOL TARTRATE 25 MG PO TABS
25.0000 mg | ORAL_TABLET | Freq: Two times a day (BID) | ORAL | Status: DC
  Administered 2021-11-22: 25 mg via ORAL
  Filled 2021-11-22: qty 1

## 2021-11-22 MED ORDER — FUROSEMIDE 10 MG/ML IJ SOLN
40.0000 mg | Freq: Every day | INTRAMUSCULAR | Status: AC
  Administered 2021-11-23: 40 mg via INTRAVENOUS
  Filled 2021-11-22: qty 4

## 2021-11-22 MED ORDER — FUROSEMIDE 10 MG/ML IJ SOLN
40.0000 mg | Freq: Once | INTRAMUSCULAR | Status: AC
  Administered 2021-11-22: 40 mg via INTRAVENOUS
  Filled 2021-11-22: qty 4

## 2021-11-22 MED ORDER — METOPROLOL TARTRATE 5 MG/5ML IV SOLN
5.0000 mg | Freq: Once | INTRAVENOUS | Status: AC
  Administered 2021-11-22: 5 mg via INTRAVENOUS
  Filled 2021-11-22: qty 5

## 2021-11-22 NOTE — ED Notes (Signed)
No changes. Alert, NAD, calm,interactive. Wife at [facility]. Echo in progress.

## 2021-11-22 NOTE — Progress Notes (Signed)
*  PRELIMINARY RESULTS* Echocardiogram 2D Echocardiogram has been performed.  Collin Knight 11/22/2021, 12:34 PM

## 2021-11-22 NOTE — ED Notes (Addendum)
Pt alert, NAD, calm, interactive, resps e/u, speech clear, denies pain, HA, nausea or sob while at rest. HOH. Hearing aids in R ear and on BS table. Denies questions or needs. O2  3L returned to pt. 93% 3L.

## 2021-11-22 NOTE — Progress Notes (Signed)
PROGRESS NOTE  Collin Knight  DOB: 01/29/1946  PCP: [name], MD MHD:[id number]  DOA: 11/21/2021  LOS: 0 days  Hospital Day: 2  Chief Complaint  Patient presents with   Chest Pain   Shortness of Breath    Brief narrative: Collin Knight is a 76 y.o. male with PMH significant for hypertension, A. fib on Coumadin, anxiety, chronic alcohol use, CAD who lives at home with his wife. Patient presented to the ED on 11/21/2021 with complaint of shortness of breath and fatigue. Patient has chronic anxiety and on as needed Valium.  Drinks about 3-4 beers a night.  For the last 1 week, patient has generalized weakness because of which he has been drinking less about 1 to 2 cans per night.  In the ED, patient was afebrile, heart rate elevated at 157, blood pressure 130/109, breathing on room air. Labs with troponin elevated to 69, WBC 8.3, D-dimer 0.39, INR 1.8 EKG with Patient was started on Cardizem drip, admitted to hospital service  Subjective: Patient was seen and examined this morning.  Pleasant elderly Caucasian male.  Propped up in bed.  On 3 to 4 L oxygen by nasal collar with oxygen saturation between 90 and 92%.  Wife at bedside.  Patient is alert, awake, oriented x3.   Assessment/Plan: A. fib with RVR -States he has A. fib for last more than a year  -Home meds include metoprolol succinate 25 mg daily as well as Coumadin. -Presented in RVR with heart rate more than 150. -Currently on Cardizem drip at 10 mg/h.  I would resume him on metoprolol at a higher daily dose with Lopressor 25 mg twice daily and wean down on Cardizem. -INR 1.9 this morning.  Close to the target of 2-3.  Continue to monitor. Recent Labs  Lab 11/21/21 1650 11/22/21 0711  INR 1.8* 1.9*   Acute respiratory failure with hypoxia -Patient does not use supplemental oxygen at home.  However he is on 3 to 4 L oxygen this morning with saturation between 90 and 92%. -WC count normal, procalcitonin level less than  0.1. -Chest x-ray on admission showed mild bibasilar subsegmental atelectasis or edema with probable small bilateral pleural effusion. -Last echocardiogram was from December 2021 with EF 50 to 55%.   -Patient reports she was not feeling well for last 1 week.  I wonder if he was having any viral syndrome leading to generalized weakness, impaired mobility and atelectasis. -Start incentive spirometry, PT eval. Recent Labs  Lab 11/21/21 1504 11/21/21 1946 11/22/21 0711  WBC 8.3  --  9.9  PROCALCITON  --  <0.10  --    Anxiety disorder -Patient seems to have uncontrolled anxiety.  He says he is trying to cut down on Valium.  Not on any other meds.  I think anxiety is feeding to his chronic alcoholism.  I will start him on Xanax 0.5 mg 3 times daily.  Watch out for sedation.  Chronic alcoholism -Drinks about 3-4 beers a night, drinking less for last 1 week because of weakness. -Currently on CIWA protocol.  Mobility: PT eval Living condition: Lives at home with wife Goals of care:   Code Status: Full Code  Nutritional status: Body mass index is 23.68 kg/m.      Diet:  Diet Order             Diet Heart Room service appropriate? Yes; Fluid consistency: Thin  Diet effective now  DVT prophylaxis:  Place TED hose Start: 11/21/21 1907   Antimicrobials: None Fluid: None Consultants: None Family Communication: Wife at bedside  Status is: Observation  Continue in-hospital care because: Remains in A. fib with RVR, ending PT eval Level of care: Progressive   Dispo: The patient is from: Home              Anticipated d/c is to: Home              Patient currently is not medically stable to d/c.   Difficult to place patient No     Infusions:   diltiazem (CARDIZEM) infusion Stopped (11/22/21 1019)    Scheduled Meds:  ALPRAZolam  0.5 mg Oral TID   budesonide  6 mg Oral Daily   folic acid  1 mg Oral Daily   metoprolol tartrate  25 mg Oral BID    multivitamin with minerals  1 tablet Oral Daily   thiamine  100 mg Oral Daily   Or   thiamine  100 mg Intravenous Daily   Warfarin - Pharmacist Dosing Inpatient   Does not apply Daily    PRN meds: acetaminophen **OR** acetaminophen, diazepam, LORazepam **OR** LORazepam, ondansetron **OR** ondansetron (ZOFRAN) IV   Antimicrobials: Anti-infectives (From admission, onward)    None       Objective: Vitals:   11/22/21 1000 11/22/21 1015  BP:    Pulse:    Resp:    Temp:    SpO2: 95% 95%    Intake/Output Summary (Last 24 hours) at 11/22/2021 1029 Last data filed at 11/21/2021 2340 Gross per 24 hour  Intake 999 ml  Output --  Net 999 ml   Filed Weights   11/21/21 1505  Weight: 74.8 kg   Weight change:  Body mass index is 23.68 kg/m.   Physical Exam: General exam: Pleasant, elderly Caucasian male.  Not in physical distress Skin: No rashes, lesions or ulcers. HEENT: Atraumatic, normocephalic, no obvious bleeding Lungs: Diminished air entry in both bases, on supplemental oxygen this morning CVS: A. fib with heart rate over 100, no murmur GI/Abd soft, nontender, nondistended, bowels are present CNS: Alert, awake, oriented x3 Psychiatry: Mood appropriate Extremities: No pedal edema, no calf tenderness  Data Review: I have personally reviewed the laboratory data and studies available.  F/u labs ordered Unresulted Labs (From admission, onward)     Start     Ordered   11/23/21 0500  CBC with Differential/Platelet  Tomorrow morning,   STAT        11/22/21 1025   11/23/21 6160  Basic metabolic panel  Tomorrow morning,   STAT        11/22/21 1025   11/23/21 0500  Magnesium  Tomorrow morning,   STAT        11/22/21 1025   11/23/21 0500  Phosphorus  Tomorrow morning,   STAT        11/22/21 1025   11/22/21 0500  Protime-INR  Daily,   STAT      11/21/21 1950   11/21/21 1916  Vitamin B1  Once,   STAT        11/21/21 1915            Signed, Terrilee Croak, MD [facility] 11/22/2021

## 2021-11-22 NOTE — Evaluation (Addendum)
Physical Therapy Evaluation Patient Details Name: Collin Knight MRN: [id number] DOB: 02/16/1946 Today's Date: 11/22/2021  History of Present Illness  76yo male who presents to the ED on 1/31 w/ chief concerns of shortness of breath and fatigue, A.Fib w/ RVR. PmHx: HTN, anxiety, mild alcohol use disorder, history of CAD, atrial fibrillation (on AC with VKA).   Clinical Impression  Pt resting in bed upon PT entrance into room for evaluation. A&Ox4 and denies any pain. Prior to hospitalization Pt was independent w/ all ADLs at home and lives w/ wife in [place] home. He states he does not use a RW or O2 at home.   Pt was able to perform bed mobility w/ supervision and was able to sit at EOB with good sitting balance. Once seated EOB, he was able to perform sit to stand w/ CGA and RW. He ambulated 63ft w/ CGA and RW for 1 bout, and then was able to ambulate another 58ft back to his room with only CGA. Vitals were monitored throughout session, but were not recorded. Pt will benefit from continued skilled PT in order to improve c/o generalized weakness to meet all goals and restore PLOF. Current discharge recommendation to HHPT is appropriate due to the level of assistance required by the patient to ensure safety and improve overall function.      Recommendations for follow up therapy are one component of a multi-disciplinary discharge planning process, led by the attending physician.  Recommendations may be updated based on patient status, additional functional criteria and insurance authorization.  Follow Up Recommendations Home health PT    Assistance Recommended at Discharge Intermittent Supervision/Assistance  Patient can return home with the following  A little help with walking and/or transfers;A little help with bathing/dressing/bathroom    Equipment Recommendations    Recommendations for Other Services       Functional Status Assessment Patient has had a recent decline in their functional  status and demonstrates the ability to make significant improvements in function in a reasonable and predictable amount of time.     Precautions / Restrictions Precautions Precautions: Fall      Mobility  Bed Mobility Overal bed mobility: Needs Assistance Bed Mobility: Supine to Sit     Supine to sit: Supervision          Transfers Overall transfer level: Needs assistance Equipment used: Rolling walker (2 wheels) Transfers: Sit to/from Stand Sit to Stand: Min guard                Ambulation/Gait Ambulation/Gait assistance: Min guard Gait Distance (Feet): 160 Feet Assistive device: Rolling walker (2 wheels), None Gait Pattern/deviations: Decreased step length - right, Decreased step length - left, Decreased stride length Gait velocity: decreased        Stairs            Wheelchair Mobility    Modified Rankin (Stroke Patients Only)       Balance Overall balance assessment: Needs assistance Sitting-balance support: Bilateral upper extremity supported, Feet supported Sitting balance-Leahy Scale: Good     Standing balance support: Bilateral upper extremity supported, During functional activity, No upper extremity supported (able to walk w/ and w/o RW (~4ft ea.)) Standing balance-Leahy Scale: Good                               Pertinent Vitals/Pain Pain Assessment Pain Assessment: No/denies pain    Home Living Family/patient expects to be discharged to::  Private residence Living Arrangements: Spouse/significant other Available Help at Discharge: Family Type of Home: [place]: One [place]: Conservation officer, nature (2 wheels);Grab bars - tub/shower;Wheelchair - manual      Prior Function Prior Level of Function : Independent/Modified Independent                     Hand Dominance        Extremity/Trunk Assessment   Upper Extremity Assessment Upper Extremity Assessment: Overall WFL for tasks  assessed    Lower Extremity Assessment Lower Extremity Assessment: Overall WFL for tasks assessed       Communication   Communication: HOH  Cognition Arousal/Alertness: Awake/alert Behavior During Therapy: WFL for tasks assessed/performed Overall Cognitive Status: Within Functional Limits for tasks assessed                                          General Comments      Exercises     Assessment/Plan    PT Assessment Patient needs continued PT services  PT Problem List Decreased strength;Decreased activity tolerance;Decreased balance;Decreased mobility       PT Treatment Interventions Gait training;Stair training;Functional mobility training;Therapeutic activities;Therapeutic exercise;Patient/family education;Balance training    PT Goals (Current goals can be found in the Care Plan section)  Acute Rehab PT Goals Patient Stated Goal: to go home PT Goal Formulation: With patient Time For Goal Achievement: 12/06/21 Potential to Achieve Goals: Good    Frequency Min 2X/week     Co-evaluation               AM-PAC PT "6 Clicks" Mobility  Outcome Measure Help needed turning from your back to your side while in a flat bed without using bedrails?: A Little Help needed moving from lying on your back to sitting on the side of a flat bed without using bedrails?: A Little Help needed moving to and from a bed to a chair (including a wheelchair)?: A Little Help needed standing up from a chair using your arms (e.g., wheelchair or bedside chair)?: A Little Help needed to walk in hospital room?: A Little Help needed climbing 3-5 steps with a railing? : A Little 6 Click Score: 18    End of Session Equipment Utilized During Treatment: Gait belt Activity Tolerance: Patient tolerated treatment well;Patient limited by fatigue Patient left: in bed;with call bell/phone within reach;Other (comment) (w/ MD in the room) Nurse Communication: Mobility status PT Visit  Diagnosis: Unsteadiness on feet (R26.81);Other abnormalities of gait and mobility (R26.89);Muscle weakness (generalized) (M62.81)    Time: 0102-0130 PT Time Calculation (min) (ACUTE ONLY): 28 min   Charges:   PT Evaluation $PT Eval Low Complexity: 1 Low PT Treatments $Therapeutic Activity: 23-37 mins       Jonnie Kind, SPT 11/22/2021, 4:09 PM

## 2021-11-22 NOTE — ED Notes (Signed)
PT working with pt at [facility].

## 2021-11-22 NOTE — Consult Note (Signed)
[place] NOTE       Patient ID: Collin Knight MRN: [id number] DOB/AGE: November 11, 1945 76 y.o.  Admit date: 11/21/2021 Referring Physician Dr. Pietro Cassis  Primary Physician [name]  Primary Cardiologist Dr. 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  He was found to be in A. fib with RVR and a diltiazem drip was started with good control of his heart rate.  Repeat echocardiogram was performed this morning and his ejection fraction was found to be reduced to 20-25% and was previously greater than 55% 1 year ago.    The patient states 3 days prior to presentation he was having significant dyspnea on exertion and severe fatigue. He could not walk between rooms in his house without gasping for air which resolves when he sits and rests. He states his exertional dyspnea has been occurring for the past ~4 months or so but acutely worsened this week. He used to walk outside with his wife most days but has not done so due to the colder weather and his fatigue. He's also been under significant stress related to the passing of his brother in law. Admits to two episodes of substernal fleeting chest pain that last "seconds" and terminate without intervention. Also has random non exertional "collar bone and upper back" pains that last for 20 minutes at a time when he's sitting at rest and also terminate on their own. Denies any dizziness, pre-syncope, recurrent chest pain, lower extremity edema, orthopnea, or significant SOB at rest.   He was on a diltiazem drip for about ~17 hours and was transitioned to his home  PO metoprolol tartrate with HR in the 100s or so during interview. Still in atrial fibrillation, but patient is feeling better - reportedly, "almost back to normal."   He drinks 3-4 beers every day and has been drinking ever since he was a teenager, and this current amount is reportedly less than he used to drink.   He doesn't think he's has a stress test in a number of years and denies any prior history of cardiac cath.   Labs on admission are significant for troponin trending 69-66.  BNP elevated 820.7.  INR slightly subtherapeutic at 1.9 and was given 6mg  warfarin today. Chest x-ray showed small bilateral pleural effusions  Review of systems complete and found to be negative unless listed above   Past Medical History:  Diagnosis Date   Anxiety     Past Surgical History:  Procedure Laterality Date   COLONOSCOPY WITH PROPOFOL N/A 09/25/2021   Procedure: COLONOSCOPY WITH PROPOFOL;  Surgeon: [name], DO;  Location: [facility] ENDOSCOPY;  Service: Gastroenterology;  Laterality: N/A;   EYE SURGERY     cataract both eyes   PACEMAKER LEADLESS INSERTION N/A 09/30/2020   Procedure: PACEMAKER LEADLESS INSERTION;  Surgeon: Isaias Cowman, MD;  Location: [place] [facility] LAB;  Service: Cardiovascular;  Laterality: N/A;    (Not in a hospital admission)  Social History   Socioeconomic History   Marital status: Married    Spouse name: Not on file   Number of children: Not on file   Years of education: Not on file   Highest education level:  Not on file  Occupational History   Not on file  Tobacco Use   Smoking status: Never   Smokeless tobacco: Never  Vaping Use   Vaping Use: Never used  Substance and Sexual Activity   Alcohol use: Yes    Alcohol/week: 21.0 standard drinks    Types: 21 Cans of beer per week    Comment: 3 beers a day   Drug use: Never   Sexual activity: Yes  Other Topics Concern   Not on file  Social History Narrative   Not on file   Social  Determinants of Health   Financial Resource Strain: Not on file  Food Insecurity: Not on file  Transportation Needs: Not on file  Physical Activity: Not on file  Stress: Not on file  Social Connections: Not on file  Intimate Partner Violence: Not on file    No family history on file.    Review of systems complete and found to be negative unless listed above    PHYSICAL EXAM General: Elderly appearing caucasian male, in no acute distress. Sitting upright in ED stretcher. HEENT:  Normocephalic and atraumatic. Hearing aid present.  Neck:  No JVD.  Lungs: Normal respiratory effort on 2.5L by Francisco. Poor air movement into bases with trace crackles.  Heart: Irregularly Irregular rhythm with controlled rate . Normal S1 and S2 without gallops or murmurs. Radial & DP pulses 2+ bilaterally. Abdomen: Non-distended appearing.  Msk: Normal strength and tone for age. Extremities: Warm and well perfused. No clubbing, cyanosis. No LE edema. Right medial ankle and dorsal aspect midfoot with ecchymosis from prior injury, per pt Neuro: Alert and oriented X 3. Psych:  Answers questions appropriately.   Labs:   Lab Results  Component Value Date   WBC 9.9 11/22/2021   HGB 15.1 11/22/2021   HCT 44.6 11/22/2021   MCV 105.9 (H) 11/22/2021   PLT 321 11/22/2021    Recent Labs  Lab 11/21/21 1650 11/22/21 0711  NA  --  139  K  --  3.9  CL  --  110  CO2  --  17*  BUN  --  20  CREATININE  --  0.84  CALCIUM  --  8.5*  PROT 5.8*  --   BILITOT 1.0  --   ALKPHOS 47  --   ALT 28  --   AST 25  --   GLUCOSE  --  87   No results found for: CKTOTAL, CKMB, CKMBINDEX, TROPONINI No results found for: CHOL No results found for: HDL No results found for: LDLCALC No results found for: TRIG No results found for: CHOLHDL No results found for: LDLDIRECT    Radiology: [facility] Chest Port 1 View  Result Date: 11/21/2021 CLINICAL DATA:  Chest pain, shortness of breath. EXAM: PORTABLE CHEST 1 VIEW COMPARISON:   September 29, 2020. FINDINGS: Stable cardiomediastinal silhouette. Mild bibasilar subsegmental atelectasis or edema is noted with probable small bilateral pleural effusions. The visualized skeletal structures are unremarkable. IMPRESSION: Mild bibasilar subsegmental atelectasis or edema is noted with probable small bilateral pleural effusions. Electronically Signed   By: Marijo Conception M.D.   On: 11/21/2021 15:32   ECHOCARDIOGRAM COMPLETE  Result Date: 11/22/2021    ECHOCARDIOGRAM REPORT   Patient Name:   Collin Knight Date of Exam: 11/22/2021 Medical Rec #:  [id number]  Height:       70.0 in Accession #:    [id number] Weight:       165.0 lb Date of Birth:  05-26-1946   BSA:          1.923 m Patient Age:    31 years   BP:           118/58 mmHg Patient Gender: M          HR:           108 bpm. Exam Location:  [facility] Procedure: 2D Echo, Color Doppler and Cardiac Doppler Indications:     R94.31 Abnormal ECG  History:         Patient has prior history of Echocardiogram examinations. CAD,                  Pacemaker, Signs/Symptoms:Shortness of Breath and Fatigue; Risk                  Factors:Hypertension.  Sonographer:     Charmayne Sheer Referring Phys:  [id number] [facility] DAHAL Diagnosing Phys: Donnelly Angelica  Sonographer Comments: Suboptimal parasternal window. IMPRESSIONS  1. Left ventricular ejection fraction, by estimation, is 20 to 25%. The left ventricle has severely decreased function. The left ventricle demonstrates global hypokinesis. The left ventricular internal cavity size was mildly dilated. Left ventricular diastolic parameters are indeterminate.  2. Right ventricular systolic function is mildly reduced. The right ventricular size is mildly enlarged.  3. Left atrial size was mildly dilated.  4. Right atrial size was moderately dilated.  5. The mitral valve is normal in structure. No evidence of mitral valve regurgitation. No evidence of mitral stenosis.  6. The aortic valve is normal in structure. Aortic valve regurgitation  is not visualized. No aortic stenosis is present.  7. The inferior vena cava is dilated in size with <50% respiratory variability, suggesting right atrial pressure of 15 mmHg. FINDINGS  Left Ventricle: Left ventricular ejection fraction, by estimation, is 20 to 25%. The left ventricle has severely decreased function. The left ventricle demonstrates global hypokinesis. The left ventricular internal cavity size was mildly dilated. There is no left ventricular hypertrophy. Left ventricular diastolic parameters are indeterminate. Right Ventricle: The right ventricular size is mildly enlarged. No increase in right ventricular wall thickness. Right ventricular systolic function is mildly reduced. Left Atrium: Left atrial size was mildly dilated. Right Atrium: Right atrial size was moderately dilated. Pericardium: There is no evidence of pericardial effusion. Mitral Valve: The mitral valve is normal in structure. No evidence of mitral valve regurgitation. No evidence of mitral valve stenosis. MV peak gradient, 5.1 mmHg. The mean mitral valve gradient is 2.0 mmHg. Tricuspid Valve: The tricuspid valve is normal in structure. Tricuspid valve regurgitation is trivial. Aortic Valve: The aortic valve is normal in structure. Aortic valve regurgitation is not visualized. No aortic stenosis is present. Aortic valve mean gradient measures 2.0 mmHg. Aortic valve peak gradient measures 3.6 mmHg. Aortic valve area, by VTI measures 2.82 cm. Pulmonic Valve: The pulmonic valve was not well visualized. Aorta: The aortic root is normal in size and structure. Venous: The inferior vena cava is dilated in size with less than 50% respiratory variability, suggesting right atrial pressure of 15 mmHg. IAS/Shunts: The interatrial septum was not well visualized.  LEFT VENTRICLE PLAX 2D LVIDd:         5.63 cm   Diastology LVIDs:         4.33 cm   LV e' lateral:   5.22 cm/s LV PW:         1.15 cm   LV E/e' lateral: 18.0 LV IVS:  0.80 cm LVOT  diam:     2.40 cm LV SV:         36 LV SV Index:   19 LVOT Area:     4.52 cm  RIGHT VENTRICLE RV Basal diam:  4.35 cm LEFT ATRIUM             Index        RIGHT ATRIUM           Index LA diam:        4.10 cm 2.13 cm/m   RA Area:     26.30 cm LA Vol (A2C):   60.5 ml 31.45 ml/m  RA Volume:   94.70 ml  49.23 ml/m LA Vol (A4C):   59.6 ml 30.99 ml/m LA Biplane Vol: 60.3 ml 31.35 ml/m  AORTIC VALVE                    PULMONIC VALVE AV Area (Vmax):    2.59 cm     PV Vmax:       0.59 m/s AV Area (Vmean):   2.70 cm     PV Vmean:      38.100 cm/s AV Area (VTI):     2.82 cm     PV VTI:        0.090 m AV Vmax:           94.40 cm/s   PV Peak grad:  1.4 mmHg AV Vmean:          62.400 cm/s  PV Mean grad:  1.0 mmHg AV VTI:            0.129 m AV Peak Grad:      3.6 mmHg AV Mean Grad:      2.0 mmHg LVOT Vmax:         54.10 cm/s LVOT Vmean:        37.200 cm/s LVOT VTI:          0.080 m LVOT/AV VTI ratio: 0.62  AORTA Ao Root diam: 3.50 cm MITRAL VALVE MV Area (PHT): 5.13 cm    SHUNTS MV Area VTI:   1.49 cm    Systemic VTI:  0.08 m MV Peak grad:  5.1 mmHg    Systemic Diam: 2.40 cm MV Mean grad:  2.0 mmHg MV Vmax:       1.13 m/s MV Vmean:      68.2 cm/s MV Decel Time: 148 msec MV E velocity: 94.05 cm/s Donnelly Angelica Electronically signed by Donnelly Angelica Signature Date/Time: 11/22/2021/1:01:37 PM    Final     ECHO 11/22/2021 LVEF 20-25% with severely decreased LV function.  LV global hypokinesis with mild dilation.  Mildly reduced RV function with mildly enlarged ventricular size.  Mild LA dilation, mild RA dilation.  -Previous echo 09/29/2020 revealed LVEF 50-55% with a low normal LV function and mild MR.   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  He was found to be in A. fib with RVR and a diltiazem drip was  started with good control of his heart rate.  Repeat echocardiogram was performed this morning and his ejection fraction was found to be reduced to 20-25% and was previously greater than 55% ~44yr year ago.    #HFrEF (11/22/21 LVEF 20-25%, previously greater than 55% 09/2020) #Paroxysmal atrial fibrillation with RVR on warfarin  #history of sinus pauses s/p micra pacemaker placement 09/2020 Pt reports history of slight DOE for the past 4 months or so. Had significant DOE and severe fatigue x 3 days prior to ED presentation. Troponin 69-66. BNP elevated to >800 with trace bialteral pleural effusions. No chest pain since in the ED. His reduction in EF over the past year could be related to his AF and tachycardia, etoh use, or a prior ischemic event as he has new Q waves on EKG and anterolateral T inversions - certainly needs further cardiac work up.  -s/p diltiazem drip with HR controlled in the low 100s-110s -Increase metoprolol tartrate 25mg  [id number] -ordered IV lasix 40 x2 and will reassess for further doses -hold warfarin and start heparin gtt once his INR falls below 2 in anticipation of potential cath during this hospitalization (once INR is 1.7 or less)  -daily INR checks.  -CHADSVASC 3 (age, CHF)   #alcohol abuse #anxiety -reported drinks 3-4 beers per day. Continue CIWA.  -strongly encouraged cessation from alcohol. Patient not interested in cessation at this time.   This patient's plan of care was discussed and created with Dr. Donnelly Angelica and he is in agreement.  Signed: Tristan Schroeder , PA-C 11/22/2021, 1:04 PM [facility] Cardiology Please [place] 7a-4p M-F.

## 2021-11-22 NOTE — Consult Note (Addendum)
ANTICOAGULATION CONSULT NOTE - Initial Consult  Pharmacy Consult for heparin Indication: atrial fibrillation  No Known Allergies  Patient Measurements: Height: 5\' 10"  (177.8 cm) Weight: 66.5 kg (146 lb 9.6 oz) IBW/kg (Calculated) : 73 Heparin Dosing Weight: 66.5 kg  Vital Signs: Temp: 98.3 F (36.8 C) (02/01 1558) BP: 135/93 (02/01 1558) Pulse Rate: 44 (02/01 1558)  Labs: Recent Labs    11/21/21 1504 11/21/21 1650 11/22/21 0711  HGB 15.3  --  15.1  HCT 45.5  --  44.6  PLT 334  --  321  LABPROT  --  21.3* 22.1*  INR  --  1.8* 1.9*  CREATININE 0.96  --  0.84  TROPONINIHS 69* 66*  --     Estimated Creatinine Clearance: 68.2 mL/min (by C-G formula based on SCr of 0.84 mg/dL).   Medical History: Past Medical History:  Diagnosis Date   Anxiety     Medications:  PTA: Warfarin 5mg  every morning (Atrial Fibrillation), INR Goal 2-3 Inpatient: Warfarin Allergies: NO AC/APT related allergies  Assessment: 76yo male w/ h/o atrial fibrillation (on AC with VKA), HTN, anxiety, mild alcohol use disorder, history of CAD,  who presents emergency department for chief concerns of shortness of breath and fatigue. Pharmacy consulted for bridging of warfarin to heparin for afib in the setting of possible cardiology intervention.  2/1 INR subtherapeutic at 1.9. Pt received warfarin 6mg  x1 this morning  Date Time aPTT/HL Rate/Comment   Goal of Therapy:  Heparin level 0.3-0.7 units/ml Monitor platelets by anticoagulation protocol: Yes   Plan:  Hold warfarin. Given likelihood of INR becoming therapeutic tomorrow we will wait to initiate heparin drip once INR falls below 2. Check INR and aPTT tomorrow with AM labs prior to initiation of heparin drip Once INR <2, consider giving IV heparin 3300 units bolus once, then initiate IV heparin drip at 1000 units/hr   Dorothe Pea, PharmD, BCPS Clinical Pharmacist   11/22/2021 5:35 PM

## 2021-11-22 NOTE — ED Notes (Addendum)
Admitting at [facility], wife at [facility]

## 2021-11-22 NOTE — Consult Note (Signed)
ANTICOAGULATION CONSULT NOTE - Consult  Pharmacy Consult for Warfarin Indication: atrial fibrillation  No Known Allergies  Patient Measurements: Height: 5\' 10"  (177.8 cm) Weight: 74.8 kg (165 lb) IBW/kg (Calculated) : 73 Heparin Dosing Weight: 74.8kg  Vital Signs: BP: 118/58 (02/01 0930) Pulse Rate: 76 (02/01 0930)  Labs: Recent Labs    11/21/21 1504 11/21/21 1650 11/22/21 0711  HGB 15.3  --  15.1  HCT 45.5  --  44.6  PLT 334  --  321  LABPROT  --  21.3* 22.1*  INR  --  1.8* 1.9*  CREATININE 0.96  --  0.84  TROPONINIHS 69* 66*  --      Estimated Creatinine Clearance: 74.8 mL/min (by C-G formula based on SCr of 0.84 mg/dL).   Medications:  PTA: Warfarin 5mg  QAM   Assessment: 76yo male w/ h/o HTN, anxiety, mild alcohol use disorder, history of CAD, atrial fibrillation (on [facility] with VKA), who presents emergency department for chief concerns of shortness of breath and fatigue. Pharmacy consulted for warfarin dosing.  Date INR Dose/Comment 1/31 1.8 5mg  dose taken 2/1  1.9 6 mg      Baseline Labs: Hgb - 15.3 Plts - 334 Trop 69>66   Goal of Therapy:  INR 2-3 Monitor platelets by anticoagulation protocol: Yes   Plan:  Will plan to increase dose ~10% x2 days and trend INR response. Dose 6mg  x1 in AM 0900 2/01. CTM for DDI's and adjust PRN daily INRs.  Dorothe Pea, PharmD, BCPS Clinical Pharmacist   11/22/2021 10:59 AM

## 2021-11-23 DIAGNOSIS — I4891 Unspecified atrial fibrillation: Secondary | ICD-10-CM | POA: Diagnosis not present

## 2021-11-23 LAB — BASIC METABOLIC PANEL
Anion gap: 9 (ref 5–15)
BUN: 18 mg/dL (ref 8–23)
CO2: 21 mmol/L — ABNORMAL LOW (ref 22–32)
Calcium: 8.3 mg/dL — ABNORMAL LOW (ref 8.9–10.3)
Chloride: 107 mmol/L (ref 98–111)
Creatinine, Ser: 0.98 mg/dL (ref 0.61–1.24)
GFR, Estimated: >60 mL/min (ref >60)
Glucose, Bld: 87 mg/dL (ref 70–99)
Potassium: 3.7 mmol/L (ref 3.5–5.1)
Sodium: 137 mmol/L (ref 135–145)

## 2021-11-23 LAB — CBC WITH DIFFERENTIAL/PLATELET
Abs Immature Granulocytes: 0.06 10*3/uL (ref 0.00–0.07)
Basophils Absolute: 0 10*3/uL (ref 0.0–0.1)
Basophils Relative: 0 %
Eosinophils Absolute: 0.1 10*3/uL (ref 0.0–0.5)
Eosinophils Relative: 1 %
HCT: 44.8 % (ref 39.0–52.0)
Hemoglobin: 15.3 g/dL (ref 13.0–17.0)
Immature Granulocytes: 1 %
Lymphocytes Relative: 18 %
Lymphs Abs: 1.4 10*3/uL (ref 0.7–4.0)
MCH: 34.9 pg — ABNORMAL HIGH (ref 26.0–34.0)
MCHC: 34.2 g/dL (ref 30.0–36.0)
MCV: 102.3 fL — ABNORMAL HIGH (ref 80.0–100.0)
Monocytes Absolute: 1 10*3/uL (ref 0.1–1.0)
Monocytes Relative: 12 %
Neutro Abs: 5.5 10*3/uL (ref 1.7–7.7)
Neutrophils Relative %: 68 %
Platelets: 316 10*3/uL (ref 150–400)
RBC: 4.38 MIL/uL (ref 4.22–5.81)
RDW: 13.7 % (ref 11.5–15.5)
WBC: 8.1 10*3/uL (ref 4.0–10.5)
nRBC: 0 % (ref 0.0–0.2)

## 2021-11-23 LAB — APTT: aPTT: 38 seconds — ABNORMAL HIGH (ref 24–36)

## 2021-11-23 LAB — PROTIME-INR
INR: 2.9 — ABNORMAL HIGH (ref 0.8–1.2)
Prothrombin Time: 29.9 seconds — ABNORMAL HIGH (ref 11.4–15.2)

## 2021-11-23 LAB — MAGNESIUM: Magnesium: 1.7 mg/dL (ref 1.7–2.4)

## 2021-11-23 LAB — PHOSPHORUS: Phosphorus: 3.8 mg/dL (ref 2.5–4.6)

## 2021-11-23 MED ORDER — FUROSEMIDE 10 MG/ML IJ SOLN
40.0000 mg | Freq: Two times a day (BID) | INTRAMUSCULAR | Status: AC
  Administered 2021-11-23 – 2021-11-24 (×2): 40 mg via INTRAVENOUS
  Filled 2021-11-23 (×2): qty 4

## 2021-11-23 MED ORDER — AMIODARONE HCL IN DEXTROSE 360-4.14 MG/200ML-% IV SOLN
60.0000 mg/h | INTRAVENOUS | Status: DC
  Administered 2021-11-23 (×2): 60 mg/h via INTRAVENOUS
  Filled 2021-11-23 (×2): qty 200

## 2021-11-23 MED ORDER — AMIODARONE HCL IN DEXTROSE 360-4.14 MG/200ML-% IV SOLN
30.0000 mg/h | INTRAVENOUS | Status: DC
  Administered 2021-11-23 – 2021-11-24 (×2): 30 mg/h via INTRAVENOUS
  Filled 2021-11-23: qty 200

## 2021-11-23 MED ORDER — AMIODARONE LOAD VIA INFUSION
150.0000 mg | Freq: Once | INTRAVENOUS | Status: AC
Start: 2021-11-23 — End: 2021-11-23
  Administered 2021-11-23: 150 mg via INTRAVENOUS
  Filled 2021-11-23: qty 83.34

## 2021-11-23 NOTE — Progress Notes (Signed)
PROGRESS NOTE  Collin Knight  DOB: 16-Apr-1946  PCP: [name], MD LNL:[id number]  DOA: 11/21/2021  LOS: 1 day  Hospital Day: 3  Chief Complaint  Patient presents with   Chest Pain   Shortness of Breath    Brief narrative: Collin Knight is a 76 y.o. male with PMH significant for hypertension, A. fib on Coumadin, anxiety, chronic alcohol use, CAD who lives at home with his wife. Patient presented to the ED on 11/21/2021 with complaint of shortness of breath and fatigue. Patient has chronic anxiety and on as needed Valium.  Drinks about 3-4 beers a night.  For the last 1 week, patient has generalized weakness because of which he has been drinking less about 1 to 2 cans per night.  In the ED, patient was afebrile, heart rate elevated at 157, blood pressure 130/109, breathing on room air. Labs with troponin elevated to 69, WBC 8.3, D-dimer 0.39, INR 1.8 EKG with Patient was started on Cardizem drip, admitted to hospital service  Subjective: Patient was seen and examined this morning.   Lying on bed.  Not in distress.  On 2 L supplemental oxygen.  Started on amiodarone drip by cardiology. Alert, awake, hard of hearing.  Oriented x3.  Family not at bedside.  Assessment/Plan: A. fib with RVR -States he has A. fib for last more than a year  -Home meds include metoprolol succinate 25 mg daily as well as Coumadin. -Presented in RVR with heart rate more than 150.  He was initially -He was initially started on Cardizem drip.  Cardiology consult appreciated.  Switched to amiodarone drip today.  -INR 1.9 this morning.  Close to the target of 2-3.  Continue to monitor. Recent Labs  Lab 11/21/21 1650 11/22/21 0711 11/23/21 0609  INR 1.8* 1.9* 2.9*    Acute systolic CHF -Echo from 4/0/8144 with EF 20 to 25%, previous echo from 12/21 with EF more than 55%. -Probably multifactorial: Ischemia, tachycardia, chronic alcoholism -Cardiology plans for ischemic evaluation sometimes during this  hospitalization, pending washout of Coumadin.  Acute respiratory failure with hypoxia -Patient does not use supplemental oxygen at home.  However he is on 3 to 4 L oxygen this morning with saturation between 90 and 92%. -New oxygen dependence is likely because of new CHF.   -Wean down as tolerated. Recent Labs  Lab 11/21/21 1504 11/21/21 1946 11/22/21 0711 11/23/21 0609  WBC 8.3  --  9.9 8.1  PROCALCITON  --  <0.10  --   --     Anxiety disorder -Patient seems to have uncontrolled anxiety.  He says he is trying to cut down on Valium.  Not on any other meds.  I think anxiety is feeding to his chronic alcoholism.  I have started him on Xanax 0.5 mg 3 times daily.  Watch out for sedation.  Chronic alcoholism -Drinks about 3-4 beers a night, drinking less for last 1 week because of weakness. -Currently on CIWA protocol.  Mobility: PT eval Living condition: Lives at home with wife Goals of care:   Code Status: Full Code  Nutritional status: Body mass index is 21.03 kg/m.      Diet:  Diet Order             Diet Heart Room service appropriate? Yes; Fluid consistency: Thin  Diet effective now                  DVT prophylaxis:  Place TED hose Start: 11/21/21 1907   Antimicrobials: None  Fluid: None Consultants: None Family Communication: Wife not at bedside  Status is: Observation  Continue in-hospital care because: Pending cardiology work-up Level of care: Progressive   Dispo: The patient is from: Home              Anticipated d/c is to: Home              Patient currently is not medically stable to d/c.   Difficult to place patient No     Infusions:   amiodarone 60 mg/hr (11/23/21 1457)   Followed by   amiodarone     diltiazem (CARDIZEM) infusion Stopped (11/22/21 1015)    Scheduled Meds:  ALPRAZolam  0.5 mg Oral TID   budesonide  6 mg Oral Daily   folic acid  1 mg Oral Daily   furosemide  40 mg Intravenous BID   metoprolol tartrate  25 mg Oral Q8H    multivitamin with minerals  1 tablet Oral Daily   thiamine  100 mg Oral Daily   Or   thiamine  100 mg Intravenous Daily    PRN meds: acetaminophen **OR** acetaminophen, diazepam, LORazepam **OR** LORazepam, ondansetron **OR** ondansetron (ZOFRAN) IV   Antimicrobials: Anti-infectives (From admission, onward)    None       Objective: Vitals:   11/23/21 1211 11/23/21 1406  BP: 131/74 96/71  Pulse: (!) 123   Resp: 17   Temp: 97.7 F (36.5 C)   SpO2: 97%     Intake/Output Summary (Last 24 hours) at 11/23/2021 1508 Last data filed at 11/23/2021 1208 Gross per 24 hour  Intake 612.79 ml  Output 1300 ml  Net -687.21 ml    Filed Weights   11/21/21 1505 11/22/21 1606  Weight: 74.8 kg 66.5 kg   Weight change: -8.346 kg Body mass index is 21.03 kg/m.   Physical Exam: General exam: Pleasant, elderly Caucasian male.  Not in physical distress Skin: No rashes, lesions or ulcers. HEENT: Atraumatic, normocephalic, no obvious bleeding Lungs: Diminished air entry in both bases, on supplemental oxygen this morning CVS: Rate controlled A. fib GI/Abd soft, nontender, nondistended, bowels are present CNS: Alert, awake, oriented x3 Psychiatry: Mood appropriate Extremities: No pedal edema, no calf tenderness  Data Review: I have personally reviewed the laboratory data and studies available.  F/u labs ordered Unresulted Labs (From admission, onward)     Start     Ordered   11/24/21 0500  CBC  Tomorrow morning,   R       Question:  Specimen collection method  Answer:  Lab=Lab collect   11/23/21 0920   11/23/21 0500  Protime-INR  Daily,   STAT      11/22/21 1444   11/21/21 1916  Vitamin B1  Once,   STAT        11/21/21 1915            Signed, Terrilee Croak, MD [facility] 11/23/2021

## 2021-11-23 NOTE — TOC Initial Note (Signed)
Transition of Care [facility]) - Initial/Assessment Note    Patient Details  Name: Collin Knight MRN: [id number] Date of Birth: 05-Aug-1946  Transition [facility] CM/SW Contact:    Alberteen Sam, LCSW Phone Number: 11/23/2021, 3:46 PM  Clinical Narrative:                  Patient agreeable to home health PT and RN, Corene Cornea with [facility] able to accept at time of discharge.   Patient reports no DME needs as his wife recently had knee surgery and has all dme at home.   PCP is Dr. Kary Kos.   TOC will continue to follow for needs.   Expected Discharge Plan: [place] Barriers to Discharge: Continued Medical Work up   Patient Goals and CMS Choice Patient states their goals for this hospitalization and ongoing recovery are:: to go home CMS Medicare.gov Compare Post Acute Care list provided to:: Patient Choice offered to / list presented to : Patient  Expected Discharge Plan and Services Expected Discharge Plan: [place] Arranged: PT, RN [place] Agency: [place] ([place]) Date [place]: 11/23/21 Time [place]: [id number] Representative spoke with at [place]: Corene Cornea  Prior Living Arrangements/Services   Lives with:: Self   Do you feel safe going back to the place where you live?: Yes               Activities of Daily Living Home Assistive Devices/Equipment: None ADL Screening (condition at time of admission) Patient's cognitive ability adequate to safely complete daily activities?: Yes Is the patient deaf or have difficulty hearing?: Yes Does the patient have difficulty seeing, even when wearing glasses/contacts?: No Does the patient have difficulty concentrating, remembering, or making decisions?: No Patient able to express need for assistance with ADLs?: Yes Does the patient have difficulty dressing or bathing?: No Independently performs ADLs?: Yes (appropriate for  developmental age) Does the patient have difficulty walking or climbing stairs?: No Weakness of Legs: None Weakness of Arms/Hands: None  Permission Sought/Granted                  Emotional Assessment              Admission diagnosis:  Pain [R52] Atrial fibrillation with rapid ventricular response ([place]) [I48.91] Atrial fibrillation with RVR ([place]) [I48.91] Patient Active Problem List   Diagnosis Date Noted   Atrial fibrillation with RVR ([place]) 11/21/2021   Shortness of breath 11/21/2021   On warfarin therapy 11/21/2021   Rapid atrial fibrillation ([place]) 09/29/2020   Recurrent syncope 09/29/2020   Anxiety 09/29/2020   Alcohol use disorder, mild, abuse 09/29/2020   Elevated lactic acid level 09/29/2020   PCP:  [name], MD Pharmacy:   [id number] - [place], [place] [address] [place] [address] Phone: [phone] Fax: [phone]     Social Determinants of Health (SDOH) Interventions    Readmission Risk Interventions No flowsheet data found.

## 2021-11-23 NOTE — Progress Notes (Signed)
Collin Knight NOTE       Patient ID: Collin Knight MRN: [id number] DOB/AGE: Feb 01, 1946 76 y.o.  Admit date: 11/21/2021 Referring Physician Dr. Pietro Cassis  Primary Physician [name]  Primary Cardiologist Dr. 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  He was found to be in A. fib with RVR and a diltiazem drip was started with good control of his heart rate.  Repeat echocardiogram was performed this morning and his ejection fraction was found to be reduced to 20-25% and was previously greater than 55% 1 year ago.    Interval history: -HR sustained in the 130s-150s in Afib overnight. Still SOB on exertion, maybe lightly better than before. No chest or upper collar bone pain today. No dizziness.  -INR 2.9 today after 6mg  warfarin yesterday -net neg 377 overnight after lasix 40.   Review of systems complete and found to be negative unless listed above   Past Medical History:  Diagnosis Date   Anxiety     Past Surgical History:  Procedure Laterality Date   COLONOSCOPY WITH PROPOFOL N/A 09/25/2021   Procedure: COLONOSCOPY WITH PROPOFOL;  Surgeon: [name], DO;  Location: [facility] ENDOSCOPY;  Service: Gastroenterology;  Laterality: N/A;   EYE SURGERY     cataract both eyes   PACEMAKER LEADLESS INSERTION N/A 09/30/2020   Procedure: PACEMAKER LEADLESS INSERTION;  Surgeon: Isaias Cowman, MD;  Location: [facility] CV LAB;  Service: Cardiovascular;  Laterality: N/A;    Medications Prior to Admission  Medication Sig Dispense Refill Last Dose   budesonide (ENTOCORT EC) 3 MG 24  hr capsule Take 6-9 mg by mouth daily.   11/21/2021 at 0900   diazepam (VALIUM) 5 MG tablet Take 5 mg by mouth 2 (two) times daily as needed.   11/21/2021 at 0900   JANTOVEN 5 MG tablet Take 5 mg by mouth daily.   11/21/2021 at 0900   metoprolol succinate (TOPROL-XL) 25 MG 24 hr tablet Take 25 mg by mouth daily.   11/21/2021 at 0900   sertraline (ZOLOFT) 25 MG tablet Take 25 mg by mouth daily. (Patient not taking: Reported on 11/21/2021)   Not Taking    Social History   Socioeconomic History   Marital status: Married    Spouse name: Not on file   Number of children: Not on file   Years of education: Not on file   Highest education level: Not on file  Occupational History   Not on file  Tobacco Use   Smoking status: Never   Smokeless tobacco: Never  Vaping Use   Vaping Use: Never used  Substance and Sexual Activity   Alcohol use: Yes    Alcohol/week: 21.0 standard drinks    Types: 21 Cans of beer per week    Comment: 3 beers a day   Drug use: Never   Sexual activity: Yes  Other Topics Concern   Not on file  Social History Narrative   Not on file   Social Determinants of Health   Financial Resource Strain: Not on file  Food Insecurity: Not on file  Transportation Needs: Not on file  Physical Activity: Not on file  Stress: Not on  file  Social Connections: Not on file  Intimate Partner Violence: Not on file    No family history on file.    Review of systems complete and found to be negative unless listed above    PHYSICAL EXAM General: Elderly appearing caucasian male, in no acute distress. Sitting upright in PCU bed with wife at bedside HEENT:  Normocephalic and atraumatic. Hearing aid present in right ear Neck:  No JVD.  Lungs: Normal respiratory effort on 2L by [place]. Poor air movement into bases with trace crackles.  Heart: tachy irregularly Irregular rhythm. Normal S1 and S2 without gallops or murmurs. Radial & DP pulses 2+ bilaterally. Abdomen: Non-distended  appearing.  Msk: Normal strength and tone for age. Extremities: Warm and well perfused. No clubbing, cyanosis. No LE edema. Neuro: Alert and oriented X 3. Psych: needs frequent redirection during conversation to present hospital problems  Labs:   Lab Results  Component Value Date   WBC 8.1 11/23/2021   HGB 15.3 11/23/2021   HCT 44.8 11/23/2021   MCV 102.3 (H) 11/23/2021   PLT 316 11/23/2021    Recent Labs  Lab 11/21/21 1650 11/22/21 0711 11/23/21 0609  NA  --    < > 137  K  --    < > 3.7  CL  --    < > 107  CO2  --    < > 21*  BUN  --    < > 18  CREATININE  --    < > 0.98  CALCIUM  --    < > 8.3*  PROT 5.8*  --   --   BILITOT 1.0  --   --   ALKPHOS 47  --   --   ALT 28  --   --   AST 25  --   --   GLUCOSE  --    < > 87   < > = values in this interval not displayed.    No results found for: CKTOTAL, CKMB, CKMBINDEX, TROPONINI No results found for: CHOL No results found for: HDL No results found for: LDLCALC No results found for: TRIG No results found for: CHOLHDL No results found for: LDLDIRECT    Radiology: [facility] Chest Port 1 View  Result Date: 11/21/2021 CLINICAL DATA:  Chest pain, shortness of breath. EXAM: PORTABLE CHEST 1 VIEW COMPARISON:  September 29, 2020. FINDINGS: Stable cardiomediastinal silhouette. Mild bibasilar subsegmental atelectasis or edema is noted with probable small bilateral pleural effusions. The visualized skeletal structures are unremarkable. IMPRESSION: Mild bibasilar subsegmental atelectasis or edema is noted with probable small bilateral pleural effusions. Electronically Signed   By: Marijo Conception M.D.   On: 11/21/2021 15:32   ECHOCARDIOGRAM COMPLETE  Result Date: 11/22/2021    ECHOCARDIOGRAM REPORT   Patient Name:   Collin Knight Date of Exam: 11/22/2021 Medical Rec #:  [id number]  Height:       70.0 in Accession #:    [id number] Weight:       165.0 lb Date of Birth:  07/01/1946   BSA:          1.923 m Patient Age:    23 years   BP:           118/58  mmHg Patient Gender: M          HR:           108 bpm. Exam Location:  [facility] Procedure: 2D Echo, Color Doppler and Cardiac Doppler Indications:  R94.31 Abnormal ECG  History:         Patient has prior history of Echocardiogram examinations. CAD,                  Pacemaker, Signs/Symptoms:Shortness of Breath and Fatigue; Risk                  Factors:Hypertension.  Sonographer:     Charmayne Sheer Referring Phys:  [id number] [facility] Diagnosing Phys: Donnelly Angelica  Sonographer Comments: Suboptimal parasternal window. IMPRESSIONS  1. Left ventricular ejection fraction, by estimation, is 20 to 25%. The left ventricle has severely decreased function. The left ventricle demonstrates global hypokinesis. The left ventricular internal cavity size was mildly dilated. Left ventricular diastolic parameters are indeterminate.  2. Right ventricular systolic function is mildly reduced. The right ventricular size is mildly enlarged.  3. Left atrial size was mildly dilated.  4. Right atrial size was moderately dilated.  5. The mitral valve is normal in structure. No evidence of mitral valve regurgitation. No evidence of mitral stenosis.  6. The aortic valve is normal in structure. Aortic valve regurgitation is not visualized. No aortic stenosis is present.  7. The inferior vena cava is dilated in size with <50% respiratory variability, suggesting right atrial pressure of 15 mmHg. FINDINGS  Left Ventricle: Left ventricular ejection fraction, by estimation, is 20 to 25%. The left ventricle has severely decreased function. The left ventricle demonstrates global hypokinesis. The left ventricular internal cavity size was mildly dilated. There is no left ventricular hypertrophy. Left ventricular diastolic parameters are indeterminate. Right Ventricle: The right ventricular size is mildly enlarged. No increase in right ventricular wall thickness. Right ventricular systolic function is mildly reduced. Left Atrium: Left atrial size was mildly  dilated. Right Atrium: Right atrial size was moderately dilated. Pericardium: There is no evidence of pericardial effusion. Mitral Valve: The mitral valve is normal in structure. No evidence of mitral valve regurgitation. No evidence of mitral valve stenosis. MV peak gradient, 5.1 mmHg. The mean mitral valve gradient is 2.0 mmHg. Tricuspid Valve: The tricuspid valve is normal in structure. Tricuspid valve regurgitation is trivial. Aortic Valve: The aortic valve is normal in structure. Aortic valve regurgitation is not visualized. No aortic stenosis is present. Aortic valve mean gradient measures 2.0 mmHg. Aortic valve peak gradient measures 3.6 mmHg. Aortic valve area, by VTI measures 2.82 cm. Pulmonic Valve: The pulmonic valve was not well visualized. Aorta: The aortic root is normal in size and structure. Venous: The inferior vena cava is dilated in size with less than 50% respiratory variability, suggesting right atrial pressure of 15 mmHg. IAS/Shunts: The interatrial septum was not well visualized.  LEFT VENTRICLE PLAX 2D LVIDd:         5.63 cm   Diastology LVIDs:         4.33 cm   LV e' lateral:   5.22 cm/s LV PW:         1.15 cm   LV E/e' lateral: 18.0 LV IVS:        0.80 cm LVOT diam:     2.40 cm LV SV:         36 LV SV Index:   19 LVOT Area:     4.52 cm  RIGHT VENTRICLE RV Basal diam:  4.35 cm LEFT ATRIUM             Index        RIGHT ATRIUM  Index LA diam:        4.10 cm 2.13 cm/m   RA Area:     26.30 cm LA Vol (A2C):   60.5 ml 31.45 ml/m  RA Volume:   94.70 ml  49.23 ml/m LA Vol (A4C):   59.6 ml 30.99 ml/m LA Biplane Vol: 60.3 ml 31.35 ml/m  AORTIC VALVE                    PULMONIC VALVE AV Area (Vmax):    2.59 cm     PV Vmax:       0.59 m/s AV Area (Vmean):   2.70 cm     PV Vmean:      38.100 cm/s AV Area (VTI):     2.82 cm     PV VTI:        0.090 m AV Vmax:           94.40 cm/s   PV Peak grad:  1.4 mmHg AV Vmean:          62.400 cm/s  PV Mean grad:  1.0 mmHg AV VTI:            0.129  m AV Peak Grad:      3.6 mmHg AV Mean Grad:      2.0 mmHg LVOT Vmax:         54.10 cm/s LVOT Vmean:        37.200 cm/s LVOT VTI:          0.080 m LVOT/AV VTI ratio: 0.62  AORTA Ao Root diam: 3.50 cm MITRAL VALVE MV Area (PHT): 5.13 cm    SHUNTS MV Area VTI:   1.49 cm    Systemic VTI:  0.08 m MV Peak grad:  5.1 mmHg    Systemic Diam: 2.40 cm MV Mean grad:  2.0 mmHg MV Vmax:       1.13 m/s MV Vmean:      68.2 cm/s MV Decel Time: 148 msec MV E velocity: 94.05 cm/s Donnelly Angelica Electronically signed by Donnelly Angelica Signature Date/Time: 11/22/2021/1:01:37 PM    Final     ECHO 11/22/2021 LVEF 20-25% with severely decreased LV function.  LV global hypokinesis with mild dilation.  Mildly reduced RV function with mildly enlarged ventricular size.  Mild LA dilation, mild RA dilation.  -Previous echo 09/29/2020 revealed LVEF 50-55% with a low normal LV function and mild MR.   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  He was found to be in A. fib with RVR and a diltiazem drip was started with good control of his heart rate. Repeat echocardiogram was performed this morning and his ejection fraction was found to be reduced to 20-25% and was previously greater than 55% ~56yr year ago.    #HFrEF (11/22/21 LVEF 20-25%, previously greater than 55% 09/2020) #Paroxysmal atrial fibrillation with RVR on warfarin  #history of sinus pauses s/p micra pacemaker placement 09/2020 Pt reports history of slight DOE for the past 4 months  or so. Had significant  DOE and severe fatigue x 3 days prior to ED presentation. Troponin 69-66. BNP elevated to >800 with trace bialteral pleural effusions. Denies chest pain. His reduction in EF over the past year could be related to his AF and tachycardia, etoh use, or a prior ischemic event as he has new Q waves on EKG and anterolateral T inversions - certainly needs further cardiac work up, but this will be pending washout of warfarin (last given 2/1). -s/p diltiazem drip with HR controlled yesterday, now HR sustained >130 since yesterday evening  -ordered amiodarone bolus and load this morning. Please continue gtt for 24 hours and will transition to PO dosing at 400mg  BID x 1 week thereafter.  -Increase metoprolol tartrate 25mg  q8hrs with hold parameters  -continue IV lasix 40 BID for now.  -hold warfarin and start heparin gtt once his INR falls below 2 in anticipation of potential heart cath during this hospitalization (once INR is 1.7 or less)  -daily INR checks. Today 2.9.  -CHADSVASC 3 (age, CHF)   #alcohol abuse #anxiety -reportedly drinks 3-4 beers per day. Continue CIWA.  -strongly encouraged cessation from alcohol. Patient clearly disinterested in cessation at this time.   This patient's plan of care was discussed and created with Dr. Donnelly Angelica and he is in agreement.  Signed: Tristan Schroeder , PA-C 11/23/2021, 10:16 AM [facility] Please [place] 7a-4p M-F.

## 2021-11-23 NOTE — Consult Note (Signed)
ANTICOAGULATION CONSULT NOTE - Initial Consult  Pharmacy Consult for heparin Indication: atrial fibrillation  No Known Allergies  Patient Measurements: Height: 5\' 10"  (177.8 cm) Weight: 66.5 kg (146 lb 9.6 oz) IBW/kg (Calculated) : 73 Heparin Dosing Weight: 66.5 kg  Vital Signs: Temp: 97.8 F (36.6 C) (02/02 0757) Temp Source: Oral (02/02 0757) BP: 83/72 (02/02 0757) Pulse Rate: 121 (02/02 0757)  Labs: Recent Labs    11/21/21 1504 11/21/21 1650 11/22/21 0711 11/23/21 0609  HGB 15.3  --  15.1 15.3  HCT 45.5  --  44.6 44.8  PLT 334  --  321 316  APTT  --   --   --  38*  LABPROT  --  21.3* 22.1* 29.9*  INR  --  1.8* 1.9* 2.9*  CREATININE 0.96  --  0.84 0.98  TROPONINIHS 69* 66*  --   --      Estimated Creatinine Clearance: 58.4 mL/min (by C-G formula based on SCr of 0.98 mg/dL).   Medical History: Past Medical History:  Diagnosis Date   Anxiety     Medications:  PTA: Warfarin 5mg  every morning (Atrial Fibrillation), INR Goal 2-3 Inpatient: Warfarin Allergies: NO AC/APT related allergies  Assessment: 76yo male w/ h/o atrial fibrillation (on AC with VKA), HTN, anxiety, mild alcohol use disorder, history of CAD,  who presents emergency department for chief concerns of shortness of breath and fatigue. Pharmacy consulted for bridging of warfarin to heparin for afib in the setting of possible cardiology intervention.  2/1 INR subtherapeutic at 1.9. Pt received warfarin 6mg  x1 this morning  2/2 INR 2.9  Date Time aPTT/HL Rate/Comment   Goal of Therapy:  Heparin level 0.3-0.7 units/ml Monitor platelets by anticoagulation protocol: Yes   Plan:  No heparin infusion today. Continue to hold warfarin Check INR and aPTT tomorrow with AM labs prior to initiation of heparin drip Once INR <2, consider giving IV heparin 3300 units bolus once, then initiate IV heparin drip at 1000 units/hr   Darrick Penna, PharmD, BCPS Clinical Pharmacist   11/23/2021 8:35  AM

## 2021-11-24 LAB — CBC
HCT: 42.3 % (ref 39.0–52.0)
Hemoglobin: 14.5 g/dL (ref 13.0–17.0)
MCH: 35.6 pg — ABNORMAL HIGH (ref 26.0–34.0)
MCHC: 34.3 g/dL (ref 30.0–36.0)
MCV: 103.9 fL — ABNORMAL HIGH (ref 80.0–100.0)
Platelets: 325 10*3/uL (ref 150–400)
RBC: 4.07 MIL/uL — ABNORMAL LOW (ref 4.22–5.81)
RDW: 13.9 % (ref 11.5–15.5)
WBC: 7.4 10*3/uL (ref 4.0–10.5)
nRBC: 0 % (ref 0.0–0.2)

## 2021-11-24 LAB — BASIC METABOLIC PANEL
Anion gap: 11 (ref 5–15)
BUN: 29 mg/dL — ABNORMAL HIGH (ref 8–23)
CO2: 21 mmol/L — ABNORMAL LOW (ref 22–32)
Calcium: 8.6 mg/dL — ABNORMAL LOW (ref 8.9–10.3)
Chloride: 103 mmol/L (ref 98–111)
Creatinine, Ser: 1.27 mg/dL — ABNORMAL HIGH (ref 0.61–1.24)
GFR, Estimated: 58 mL/min — ABNORMAL LOW (ref >60)
Glucose, Bld: 96 mg/dL (ref 70–99)
Potassium: 3.7 mmol/L (ref 3.5–5.1)
Sodium: 135 mmol/L (ref 135–145)

## 2021-11-24 LAB — PROTIME-INR
INR: 4.5 (ref 0.8–1.2)
Prothrombin Time: 42.7 seconds — ABNORMAL HIGH (ref 11.4–15.2)

## 2021-11-24 LAB — APTT: aPTT: 42 seconds — ABNORMAL HIGH (ref 24–36)

## 2021-11-24 LAB — VITAMIN B1: Vitamin B1 (Thiamine): 103.6 nmol/L (ref 66.5–200.0)

## 2021-11-24 MED ORDER — MAGNESIUM SULFATE 2 GM/50ML IV SOLN
2.0000 g | Freq: Once | INTRAVENOUS | Status: AC
  Administered 2021-11-24: 2 g via INTRAVENOUS
  Filled 2021-11-24: qty 50

## 2021-11-24 MED ORDER — FUROSEMIDE 10 MG/ML IJ SOLN
40.0000 mg | Freq: Two times a day (BID) | INTRAMUSCULAR | Status: DC
Start: 2021-11-24 — End: 2021-11-24

## 2021-11-24 MED ORDER — POTASSIUM CHLORIDE CRYS ER 20 MEQ PO TBCR
40.0000 meq | EXTENDED_RELEASE_TABLET | Freq: Once | ORAL | Status: AC
  Administered 2021-11-24: 40 meq via ORAL
  Filled 2021-11-24: qty 2

## 2021-11-24 MED ORDER — FUROSEMIDE 40 MG PO TABS: 40.0000 mg | ORAL_TABLET | Freq: Every day | ORAL | Status: DC

## 2021-11-24 MED ORDER — AMIODARONE HCL 200 MG PO TABS
400.0000 mg | ORAL_TABLET | Freq: Two times a day (BID) | ORAL | Status: DC
  Administered 2021-11-24 (×2): 400 mg via ORAL
  Filled 2021-11-24 (×2): qty 2

## 2021-11-24 MED ORDER — METOPROLOL TARTRATE 25 MG PO TABS
25.0000 mg | ORAL_TABLET | Freq: Two times a day (BID) | ORAL | Status: DC
  Administered 2021-11-24: 25 mg via ORAL
  Filled 2021-11-24: qty 1

## 2021-11-24 NOTE — Progress Notes (Addendum)
[name] NOTE       Patient ID: Collin Knight MRN: [id number] DOB/AGE: 26-May-1946 76 y.o.  Admit date: 11/21/2021 Referring Physician Dr. Pietro Cassis  Primary Physician [name]  Primary Cardiologist Dr. 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  He was found to be in A. fib with RVR and a diltiazem drip was started with good control of his heart rate.  Repeat echocardiogram was performed this morning and his ejection fraction was found to be reduced to 20-25% and was previously greater than 55% 1 year ago.    ADDENDUM: per nursing and pharmacy report after I saw the pt initially today, the patient's wife has been giving the patient his home warfarin while he has been in the hospital, which explains his increasing INR. Cardiac cath will likely be performed on an outpatient basis.   Interval history: -feels much better today. No sob. No chest pain. Off O2 at rest. Walked a short distance (262ft) with PT and was not hypoxic. Stopped d/t weakness. -INR 4.5 today. Has not had warfarin since the morning of 11/22/21  Review of systems complete and found to be negative unless listed above   Past Medical History:  Diagnosis Date   Anxiety     Past Surgical History:  Procedure Laterality Date   COLONOSCOPY WITH PROPOFOL N/A 09/25/2021   Procedure: COLONOSCOPY WITH PROPOFOL;  Surgeon: [name], DO;  Location: [facility] ENDOSCOPY;  Service: Gastroenterology;  Laterality: N/A;   EYE SURGERY     cataract both eyes   PACEMAKER LEADLESS INSERTION N/A 09/30/2020   Procedure: PACEMAKER  LEADLESS INSERTION;  Surgeon: Isaias Cowman, MD;  Location: [place] LAB;  Service: Cardiovascular;  Laterality: N/A;    Medications Prior to Admission  Medication Sig Dispense Refill Last Dose   budesonide (ENTOCORT EC) 3 MG 24 hr capsule Take 6-9 mg by mouth daily.   11/21/2021 at 0900   diazepam (VALIUM) 5 MG tablet Take 5 mg by mouth 2 (two) times daily as needed.   11/21/2021 at 0900   JANTOVEN 5 MG tablet Take 5 mg by mouth daily.   11/21/2021 at 0900   metoprolol succinate (TOPROL-XL) 25 MG 24 hr tablet Take 25 mg by mouth daily.   11/21/2021 at 0900   sertraline (ZOLOFT) 25 MG tablet Take 25 mg by mouth daily. (Patient not taking: Reported on 11/21/2021)   Not Taking    Social History   Socioeconomic History   Marital status: Married    Spouse name: Not on file   Number of children: Not on file   Years of education: Not on file   Highest education level: Not on file  Occupational History   Not on file  Tobacco Use   Smoking status: Never   Smokeless tobacco: Never  Vaping Use   Vaping Use: Never used  Substance and Sexual Activity   Alcohol use: Yes    Alcohol/week: 21.0 standard drinks    Types: 21 Cans of beer per week    Comment: 3 beers a day   Drug use: Never   Sexual activity: Yes  Other Topics Concern   Not on file  Social History Narrative   Not on file   Social Determinants of Health   Financial Resource Strain: Not on file  Food Insecurity: Not on file  Transportation Needs: Not on file  Physical Activity: Not on file  Stress: Not on file  Social Connections: Not on file  Intimate Partner Violence: Not on file    No family history on file.    Review of systems complete and found to be negative unless listed above    PHYSICAL EXAM General: Elderly appearing caucasian male, in no acute distress. Sitting upright in PCU bed. HEENT:  Normocephalic and atraumatic. Hearing aid present in right ear, hard of hearing Neck:  No JVD.  Lungs:  Normal respiratory effort on room air. Clear to ascultation bilaterally  Heart: HRRR with frequent extra beats. Normal S1 and S2 without gallops or murmurs. Radial & DP pulses 2+ bilaterally. Abdomen: Non-distended appearing.  Msk: Normal strength and tone for age. Extremities: Warm and well perfused. No clubbing, cyanosis. No LE edema. Neuro: Alert and oriented X 3. Psych: mood appropriate.   Labs:   Lab Results  Component Value Date   WBC 7.4 11/24/2021   HGB 14.5 11/24/2021   HCT 42.3 11/24/2021   MCV 103.9 (H) 11/24/2021   PLT 325 11/24/2021    Recent Labs  Lab 11/21/21 1650 11/22/21 0711 11/24/21 0724  NA  --    < > 135  K  --    < > 3.7  CL  --    < > 103  CO2  --    < > 21*  BUN  --    < > 29*  CREATININE  --    < > 1.27*  CALCIUM  --    < > 8.6*  PROT 5.8*  --   --   BILITOT 1.0  --   --   ALKPHOS 47  --   --   ALT 28  --   --   AST 25  --   --   GLUCOSE  --    < > 96   < > = values in this interval not displayed.    No results found for: CKTOTAL, CKMB, CKMBINDEX, TROPONINI No results found for: CHOL No results found for: HDL No results found for: LDLCALC No results found for: TRIG No results found for: CHOLHDL No results found for: LDLDIRECT    Radiology: [facility] Chest Port 1 View  Result Date: 11/21/2021 CLINICAL DATA:  Chest pain, shortness of breath. EXAM: PORTABLE CHEST 1 VIEW COMPARISON:  September 29, 2020. FINDINGS: Stable cardiomediastinal silhouette. Mild bibasilar subsegmental atelectasis or edema is noted with probable small bilateral pleural effusions. The visualized skeletal structures are unremarkable. IMPRESSION: Mild bibasilar subsegmental atelectasis or edema is noted with probable small bilateral pleural effusions. Electronically Signed   By: Marijo Conception M.D.   On: 11/21/2021 15:32   ECHOCARDIOGRAM COMPLETE  Result Date: 11/22/2021    ECHOCARDIOGRAM REPORT   Patient Name:   Collin Knight Date of Exam: 11/22/2021 Medical Rec #:  [id number]  Height:        70.0 in Accession #:    [id number] Weight:       165.0 lb Date of Birth:  1946-06-26   BSA:          1.923 m Patient Age:    76 years   BP:           118/58 mmHg Patient Gender: M  HR:           108 bpm. Exam Location:  [facility] Procedure: 2D Echo, Color Doppler and Cardiac Doppler Indications:     R94.31 Abnormal ECG  History:         Patient has prior history of Echocardiogram examinations. CAD,                  Pacemaker, Signs/Symptoms:Shortness of Breath and Fatigue; Risk                  Factors:Hypertension.  Sonographer:     Charmayne Sheer Referring Phys:  [id number] [facility]HAL Diagnosing Phys: Donnelly Angelica  Sonographer Comments: Suboptimal parasternal window. IMPRESSIONS  1. Left ventricular ejection fraction, by estimation, is 20 to 25%. The left ventricle has severely decreased function. The left ventricle demonstrates global hypokinesis. The left ventricular internal cavity size was mildly dilated. Left ventricular diastolic parameters are indeterminate.  2. Right ventricular systolic function is mildly reduced. The right ventricular size is mildly enlarged.  3. Left atrial size was mildly dilated.  4. Right atrial size was moderately dilated.  5. The mitral valve is normal in structure. No evidence of mitral valve regurgitation. No evidence of mitral stenosis.  6. The aortic valve is normal in structure. Aortic valve regurgitation is not visualized. No aortic stenosis is present.  7. The inferior vena cava is dilated in size with <50% respiratory variability, suggesting right atrial pressure of 15 mmHg. FINDINGS  Left Ventricle: Left ventricular ejection fraction, by estimation, is 20 to 25%. The left ventricle has severely decreased function. The left ventricle demonstrates global hypokinesis. The left ventricular internal cavity size was mildly dilated. There is no left ventricular hypertrophy. Left ventricular diastolic parameters are indeterminate. Right Ventricle: The right ventricular size is  mildly enlarged. No increase in right ventricular wall thickness. Right ventricular systolic function is mildly reduced. Left Atrium: Left atrial size was mildly dilated. Right Atrium: Right atrial size was moderately dilated. Pericardium: There is no evidence of pericardial effusion. Mitral Valve: The mitral valve is normal in structure. No evidence of mitral valve regurgitation. No evidence of mitral valve stenosis. MV peak gradient, 5.1 mmHg. The mean mitral valve gradient is 2.0 mmHg. Tricuspid Valve: The tricuspid valve is normal in structure. Tricuspid valve regurgitation is trivial. Aortic Valve: The aortic valve is normal in structure. Aortic valve regurgitation is not visualized. No aortic stenosis is present. Aortic valve mean gradient measures 2.0 mmHg. Aortic valve peak gradient measures 3.6 mmHg. Aortic valve area, by VTI measures 2.82 cm. Pulmonic Valve: The pulmonic valve was not well visualized. Aorta: The aortic root is normal in size and structure. Venous: The inferior vena cava is dilated in size with less than 50% respiratory variability, suggesting right atrial pressure of 15 mmHg. IAS/Shunts: The interatrial septum was not well visualized.  LEFT VENTRICLE PLAX 2D LVIDd:         5.63 cm   Diastology LVIDs:         4.33 cm   LV e' lateral:   5.22 cm/s LV PW:         1.15 cm   LV E/e' lateral: 18.0 LV IVS:        0.80 cm LVOT diam:     2.40 cm LV SV:         36 LV SV Index:   19 LVOT Area:     4.52 cm  RIGHT VENTRICLE RV Basal diam:  4.35 cm LEFT ATRIUM  Index        RIGHT ATRIUM           Index LA diam:        4.10 cm 2.13 cm/m   RA Area:     26.30 cm LA Vol (A2C):   60.5 ml 31.45 ml/m  RA Volume:   94.70 ml  49.23 ml/m LA Vol (A4C):   59.6 ml 30.99 ml/m LA Biplane Vol: 60.3 ml 31.35 ml/m  AORTIC VALVE                    PULMONIC VALVE AV Area (Vmax):    2.59 cm     PV Vmax:       0.59 m/s AV Area (Vmean):   2.70 cm     PV Vmean:      38.100 cm/s AV Area (VTI):     2.82 cm      PV VTI:        0.090 m AV Vmax:           94.40 cm/s   PV Peak grad:  1.4 mmHg AV Vmean:          62.400 cm/s  PV Mean grad:  1.0 mmHg AV VTI:            0.129 m AV Peak Grad:      3.6 mmHg AV Mean Grad:      2.0 mmHg LVOT Vmax:         54.10 cm/s LVOT Vmean:        37.200 cm/s LVOT VTI:          0.080 m LVOT/AV VTI ratio: 0.62  AORTA Ao Root diam: 3.50 cm MITRAL VALVE MV Area (PHT): 5.13 cm    SHUNTS MV Area VTI:   1.49 cm    Systemic VTI:  0.08 m MV Peak grad:  5.1 mmHg    Systemic Diam: 2.40 cm MV Mean grad:  2.0 mmHg MV Vmax:       1.13 m/s MV Vmean:      68.2 cm/s MV Decel Time: 148 msec MV E velocity: 94.05 cm/s Donnelly Angelica Electronically signed by Donnelly Angelica Signature Date/Time: 11/22/2021/1:01:37 PM    Final     ECHO 11/22/2021 LVEF 20-25% with severely decreased LV function.  LV global hypokinesis with mild dilation.  Mildly reduced RV function with mildly enlarged ventricular size.  Mild LA dilation, mild RA dilation.  -Previous echo 09/29/2020 revealed LVEF 50-55% with a low normal LV function and mild MR.   TELEMETRY reviewed by me: periods of junctional rhythm, paced beats, and sinus brady with vent bigeminy -rate controlled in the 60s.  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  He was found to be in A. fib with RVR and a diltiazem drip was started with good control of his heart rate. Repeat echocardiogram was performed this morning and his ejection fraction was found to be reduced to 20-25% and was previously greater than 55% ~48yr year  ago.    #HFrEF (11/22/21 LVEF 20-25%, previously greater than 55%  09/2020) #Paroxysmal atrial fibrillation with RVR on warfarin  #history of sinus pauses s/p micra pacemaker placement 09/2020 Pt reports history of slight DOE for the past 4 months or so. Had significant DOE and severe fatigue x 3 days prior to ED presentation. Troponin 69-66. BNP elevated to >800 with trace bialteral pleural effusions. Denies chest pain. His reduction in EF over the past year could be related to his AF and tachycardia, etoh use, or a prior ischemic event, he certainly needs further cardiac work up, but this will be pending washout of warfarin (last given 2/1). -HR much better after amio bolus and gtt yesterday, tele shows periods of junctional rhythm, paced beats, and sinus brady with vent bigeminy -rate controlled in the 60s.  -s/p diltiazem drip and s/p amiodarone bolus and gtt. Transitioned to [facility] 400mg  BID this morning. Continue this dosing x 1 week. -decr metoprolol tartrate 25mg  back to BID dosing w/ hold parameters -d/c lasix this morning as he has a bump in Cr to 1.27 and gfr 58.  Discharge on lasix 40mg  once daily.  -hold warfarin. Daily INR checks. Today 4.5. Likely will need to perform cardiac cath on an outpatient basis once INR is 1.7 or less, which is unlikely to happen by tomorrow.  -likely ok for discharge tomorrow from a cardiac standpoint provided he remains off supplemental O2 and HR remains controlled.  -CHADSVASC 3 (age, CHF)   #alcohol abuse #anxiety -reportedly drinks 3-4 beers per day. Continue CIWA.  -strongly encouraged cessation from alcohol. Patient clearly disinterested in cessation.  This patient's plan of care was discussed and created with Dr. Donnelly Angelica and he is in agreement.  Signed: Tristan Schroeder , PA-C 11/24/2021, 11:12 AM [facility] Cardiology [facility] [place] 7a-4p M-F.

## 2021-11-24 NOTE — Progress Notes (Signed)
Physical Therapy Treatment Patient Details Name: Collin Knight MRN: [id number] DOB: [birth year] Today's Date: 11/24/2021   History of Present Illness 76yo male who presents to the ED on 1/31 w/ chief concerns of shortness of breath and fatigue, A.Fib w/ RVR. PmHx: HTN, anxiety, mild alcohol use disorder, history of CAD, atrial fibrillation (on AC with VKA).    PT Comments    Patient alert, agreeable to PT, family at bedside. Denied pain. Pt placed on room air, spO2 90-92% at rest. Bed mobility performed with supervision, sit <> stand with supervision as well. Pt initially without RW, but did tend to reach for external support, RW provided. Improved confidence and cadence noted. He was able to ambulate ~245ft with RW and CGA-supervision, 2-3 standing rest breaks due to pt fatigue. SpO2 >95% throughout session, and in bed at end of session on room air, RN notified. The patient would benefit from further skilled PT intervention to continue to progress towards goals. Recommendation remains appropriate.       Recommendations for follow up therapy are one component of a multi-disciplinary discharge planning process, led by the attending physician.  Recommendations may be updated based on patient status, additional functional criteria and insurance authorization.  Follow Up Recommendations  Outpatient PT     Assistance Recommended at Discharge Intermittent Supervision/Assistance  Patient can return home with the following A little help with walking and/or transfers;A little help with bathing/dressing/bathroom;Help with stairs or ramp for entrance   Equipment Recommendations  None recommended by PT    Recommendations for Other Services       Precautions / Restrictions Precautions Precautions: Fall Restrictions Weight Bearing Restrictions: No     Mobility  Bed Mobility Overal bed mobility: Modified Independent                  Transfers Overall transfer level: Needs  assistance Equipment used: Rolling walker (2 wheels), None Transfers: Sit to/from Stand Sit to Stand: Supervision                Ambulation/Gait Ambulation/Gait assistance: Min guard, Supervision Gait Distance (Feet): 220 Feet Assistive device: Rolling walker (2 wheels)   Gait velocity: decreased     General Gait Details: with improved confidence, comfort, and cadence with RW.   Stairs             Wheelchair Mobility    Modified Rankin (Stroke Patients Only)       Balance Overall balance assessment: Needs assistance Sitting-balance support: Bilateral upper extremity supported, Feet supported Sitting balance-Leahy Scale: Good     Standing balance support: Bilateral upper extremity supported, During functional activity, No upper extremity supported Standing balance-Leahy Scale: Good                              Cognition Arousal/Alertness: Awake/alert Behavior During Therapy: WFL for tasks assessed/performed Overall Cognitive Status: Within Functional Limits for tasks assessed                                          Exercises      General Comments        Pertinent Vitals/Pain Pain Assessment Pain Assessment: No/denies pain    Home Living  Prior Function            PT Goals (current goals can now be found in the care plan section) Progress towards PT goals: Progressing toward goals    Frequency    Min 2X/week      PT Plan Current plan remains appropriate    Co-evaluation              AM-PAC PT "6 Clicks" Mobility   Outcome Measure  Help needed turning from your back to your side while in a flat bed without using bedrails?: None Help needed moving from lying on your back to sitting on the side of a flat bed without using bedrails?: None Help needed moving to and from a bed to a chair (including a wheelchair)?: None Help needed standing up from a chair using  your arms (e.g., wheelchair or bedside chair)?: None Help needed to walk in hospital room?: None Help needed climbing 3-5 steps with a railing? : A Little 6 Click Score: 23    End of Session   Activity Tolerance: Patient tolerated treatment well Patient left: in bed;with call bell/phone within reach Nurse Communication: Other (comment) (oxygen status) PT Visit Diagnosis: Unsteadiness on feet (R26.81);Other abnormalities of gait and mobility (R26.89);Muscle weakness (generalized) (M62.81)     Time: 2683-4196 PT Time Calculation (min) (ACUTE ONLY): 16 min  Charges:  $Therapeutic Exercise: 8-22 mins                     Lieutenant Diego PT, DPT 11:02 AM,11/24/21

## 2021-11-24 NOTE — Consult Note (Signed)
ANTICOAGULATION CONSULT NOTE - Initial Consult  Pharmacy Consult for heparin Indication: atrial fibrillation  No Known Allergies  Patient Measurements: Height: 5\' 10"  (177.8 cm) Weight: 66.5 kg (146 lb 9.6 oz) IBW/kg (Calculated) : 73 Heparin Dosing Weight: 66.5 kg  Vital Signs: Temp: 97.8 F (36.6 C) (02/03 0806) Temp Source: Oral (02/03 0806) BP: 99/74 (02/03 0806) Pulse Rate: 60 (02/03 0806)  Labs: Recent Labs    11/21/21 1504 11/21/21 1650 11/21/21 1650 11/22/21 0711 11/23/21 0609 11/24/21 0724  HGB 15.3  --   --  15.1 15.3 14.5  HCT 45.5  --   --  44.6 44.8 42.3  PLT 334  --   --  321 316 325  APTT  --   --   --   --  38* 42*  LABPROT  --  21.3*   < > 22.1* 29.9* 42.7*  INR  --  1.8*   < > 1.9* 2.9* 4.5*  CREATININE 0.96  --   --  0.84 0.98 1.27*  TROPONINIHS 69* 66*  --   --   --   --    < > = values in this interval not displayed.     Estimated Creatinine Clearance: 45.1 mL/min (A) (by C-G formula based on SCr of 1.27 mg/dL (H)).   Medical History: Past Medical History:  Diagnosis Date   Anxiety     Medications:  PTA: Warfarin 5mg  every morning (Atrial Fibrillation), INR Goal 2-3 Inpatient: Warfarin Allergies: NO AC/APT related allergies  Assessment: 76yo male w/ h/o atrial fibrillation (on AC with VKA), HTN, anxiety, mild alcohol use disorder, history of CAD,  who presents emergency department for chief concerns of shortness of breath and fatigue. Pharmacy consulted for bridging of warfarin to heparin for afib in the setting of possible cardiology intervention.  2/1 INR subtherapeutic at 1.9. Pt received warfarin 6mg  x1 this morning  2/2 INR 2.9  2/3 INR 4.5  Date Time aPTT/HL Rate/Comment   Goal of Therapy:  Heparin level 0.3-0.7 units/ml Monitor platelets by anticoagulation protocol: Yes   Plan:  No heparin infusion today. Continue to hold warfarin Check INR and aPTT tomorrow with AM labs prior to initiation of heparin drip Once INR  <2, consider giving IV heparin 3300 units bolus once, then initiate IV heparin drip at 1000 units/hr   Darrick Penna, PharmD, BCPS Clinical Pharmacist   11/24/2021 9:10 AM

## 2021-11-24 NOTE — Care Management Important Message (Signed)
Important Message  Patient Details  Name: Collin Knight MRN: [id number] Date of Birth: 31-Dec-1945   Medicare Important Message Given:  N/A - LOS <3 / Initial given by admissions     Dannette Barbara 11/24/2021, 8:58 AM

## 2021-11-24 NOTE — Progress Notes (Signed)
PROGRESS NOTE  Collin Knight  DOB: 1946/05/27  PCP: [name], MD VVO:[id number]  DOA: 11/21/2021  LOS: 2 days  Hospital Day: 4  Chief Complaint  Patient presents with   Chest Pain   Shortness of Breath    Brief narrative: Collin Knight is a 76 y.o. male with PMH significant for hypertension, A. fib on Coumadin, anxiety, chronic alcohol use, CAD who lives at home with his wife. Patient presented to the ED on 11/21/2021 with complaint of shortness of breath and fatigue. Patient has chronic anxiety and on as needed Valium.  Drinks about 3-4 beers a night.  For the last 1 week, patient has generalized weakness because of which he has been drinking less about 1 to 2 cans per night.  In the ED, patient was afebrile, heart rate elevated at 157, blood pressure 130/109, breathing on room air. Labs with troponin elevated to 69, WBC 8.3, D-dimer 0.39, INR 1.8 EKG with Patient was started on Cardizem drip, admitted to hospital service  Subjective: Patient was seen and examined this morning.   Lying down in bed.  Not in distress.  Not on supplemental oxygen.  Wife at bedside. We discussed about uptrending INR despite Coumadin being on hold.  Patient and wife confidently state that patient has not been self taking Coumadin as previous reported by nursing staff.  Assessment/Plan: A. fib with RVR Supratherapeutic INR -Patient has history of A. fib for about a year.  Home meds include metoprolol succinate 25 mg daily and Coumadin. -Presented in RVR with heart rate more than 150.   -He was initially started on Cardizem drip.  Cardiology consult appreciated.  Later switched to amiodarone drip and now to amiodarone oral.  Metoprolol dose reduced to 25 mg twice daily. -INR up to 4.5 this morning despite Coumadin being on hold. -Patient and family denies self taking Coumadin.  No need of reversal at this time as he has no active bleeding. Recent Labs  Lab 11/21/21 1650 11/22/21 0711 11/23/21 0609  11/24/21 0724  INR 1.8* 1.9* 2.9* 4.5*   Acute systolic CHF -Echo from 11/28/9483 with EF 20 to 25%, previous echo from 12/21 with EF more than 55%. -Probably multifactorial: Ischemia, tachycardia, chronic alcoholism -Cardiology plans for ischemic evaluation as an outpatient once INR is less than 1.7. -Patient is getting diuresed with IV Lasix.  This morning's dose was held because of bump in creatinine.  Repeat creatinine tomorrow. Recent Labs    11/21/21 1504 11/22/21 0711 11/23/21 0609 11/24/21 0724  BUN 23 20 18  29*  CREATININE 0.96 0.84 0.98 1.27*   Acute respiratory failure with hypoxia -Patient does not use supplemental oxygen at home.  However he is on 3 to 4 L oxygen this morning with saturation between 90 and 92%. -New oxygen dependence is likely because of new CHF.   -Noted not requiring supplemental oxygen.  Per PT note, he was able to ambulate without supplemental oxygen today. Recent Labs  Lab 11/21/21 1504 11/21/21 1946 11/22/21 0711 11/23/21 0609 11/24/21 0724  WBC 8.3  --  9.9 8.1 7.4  PROCALCITON  --  <0.10  --   --   --    Anxiety disorder -Patient seems to have uncontrolled anxiety.  He says he is trying to cut down on Valium.  Not on any other meds.  I think anxiety is feeding to his chronic alcoholism.  I started him on Xanax 0.5 mg 3 times daily.  Watch out for sedation.  Chronic alcoholism -Drinks about  3-4 beers a night, drinking less for last 1 week because of weakness. -Currently on CIWA protocol.  Mobility: PT eval Living condition: Lives at home with wife Goals of care:   Code Status: Full Code  Nutritional status: Body mass index is 21.03 kg/m.      Diet:  Diet Order             Diet Heart Room service appropriate? Yes; Fluid consistency: Thin  Diet effective now                  DVT prophylaxis:  Place TED hose Start: 11/21/21 1907   Antimicrobials: None Fluid: None Consultants: None Family Communication: Wife at  bedside  Status is: Observation  Continue in-hospital care because: Per cardiology, plan to discharge home tomorrow if rhythm stays stable Level of care: Progressive   Dispo: The patient is from: Home              Anticipated d/c is to: Home tomorrow              Patient currently is not medically stable to d/c.   Difficult to place patient No     Infusions:     Scheduled Meds:  ALPRAZolam  0.5 mg Oral TID   amiodarone  400 mg Oral BID   budesonide  6 mg Oral Daily   folic acid  1 mg Oral Daily   metoprolol tartrate  25 mg Oral BID   multivitamin with minerals  1 tablet Oral Daily   thiamine  100 mg Oral Daily   Or   thiamine  100 mg Intravenous Daily    PRN meds: acetaminophen **OR** acetaminophen, diazepam, LORazepam **OR** LORazepam, ondansetron **OR** ondansetron (ZOFRAN) IV   Antimicrobials: Anti-infectives (From admission, onward)    None       Objective: Vitals:   11/24/21 1150 11/24/21 1440  BP: 110/76 116/85  Pulse: (!) 59 60  Resp: 17 18  Temp: 97.8 F (36.6 C) 97.7 F (36.5 C)  SpO2: 92% 98%    Intake/Output Summary (Last 24 hours) at 11/24/2021 1458 Last data filed at 11/24/2021 1436 Gross per 24 hour  Intake 1280 ml  Output 350 ml  Net 930 ml   Filed Weights   11/21/21 1505 11/22/21 1606  Weight: 74.8 kg 66.5 kg   Weight change:  Body mass index is 21.03 kg/m.   Physical Exam: General exam: Pleasant, elderly Caucasian male.  Not in physical distress Skin: No rashes, lesions or ulcers. HEENT: Atraumatic, normocephalic, no obvious bleeding Lungs: Diminished air entry in both bases, on supplemental oxygen this morning CVS: Rate controlled A. fib GI/Abd soft, nontender, nondistended, bowels are present CNS: Alert, awake, oriented x3 Psychiatry: Mood appropriate Extremities: No pedal edema, no calf tenderness  Data Review: I have personally reviewed the laboratory data and studies available.  F/u labs ordered Unresulted Labs (From  admission, onward)     Start     Ordered   11/25/21 0500  CBC with Differential/Platelet  Tomorrow morning,   R       Question:  Specimen collection method  Answer:  Lab=Lab collect   11/24/21 0821   11/24/21 4008  Basic metabolic panel  Daily,   R     Question:  Specimen collection method  Answer:  Lab=Lab collect   11/24/21 0800   11/23/21 0500  Protime-INR  Daily,   STAT      11/22/21 1444   11/21/21 1916  Vitamin B1  Once,  STAT        11/21/21 1915            Signed, Terrilee Croak, MD [facility] 11/24/2021

## 2021-11-25 LAB — CBC WITH DIFFERENTIAL/PLATELET
Abs Immature Granulocytes: 0.03 10*3/uL (ref 0.00–0.07)
Basophils Absolute: 0 10*3/uL (ref 0.0–0.1)
Basophils Relative: 0 %
Eosinophils Absolute: 0.2 10*3/uL (ref 0.0–0.5)
Eosinophils Relative: 3 %
HCT: 42 % (ref 39.0–52.0)
Hemoglobin: 14 g/dL (ref 13.0–17.0)
Immature Granulocytes: 0 %
Lymphocytes Relative: 21 %
Lymphs Abs: 1.5 10*3/uL (ref 0.7–4.0)
MCH: 35 pg — ABNORMAL HIGH (ref 26.0–34.0)
MCHC: 33.3 g/dL (ref 30.0–36.0)
MCV: 105 fL — ABNORMAL HIGH (ref 80.0–100.0)
Monocytes Absolute: 1 10*3/uL (ref 0.1–1.0)
Monocytes Relative: 14 %
Neutro Abs: 4.6 10*3/uL (ref 1.7–7.7)
Neutrophils Relative %: 62 %
Platelets: 300 10*3/uL (ref 150–400)
RBC: 4 MIL/uL — ABNORMAL LOW (ref 4.22–5.81)
RDW: 13.8 % (ref 11.5–15.5)
WBC: 7.3 10*3/uL (ref 4.0–10.5)
nRBC: 0 % (ref 0.0–0.2)

## 2021-11-25 LAB — BASIC METABOLIC PANEL
Anion gap: 7 (ref 5–15)
BUN: 24 mg/dL — ABNORMAL HIGH (ref 8–23)
CO2: 27 mmol/L (ref 22–32)
Calcium: 8.5 mg/dL — ABNORMAL LOW (ref 8.9–10.3)
Chloride: 104 mmol/L (ref 98–111)
Creatinine, Ser: 1.07 mg/dL (ref 0.61–1.24)
GFR, Estimated: >60 mL/min (ref >60)
Glucose, Bld: 84 mg/dL (ref 70–99)
Potassium: 3.7 mmol/L (ref 3.5–5.1)
Sodium: 138 mmol/L (ref 135–145)

## 2021-11-25 LAB — PROTIME-INR
INR: 3.1 — ABNORMAL HIGH (ref 0.8–1.2)
Prothrombin Time: 31.9 seconds — ABNORMAL HIGH (ref 11.4–15.2)

## 2021-11-25 MED ORDER — AMIODARONE HCL 200 MG PO TABS
400.0000 mg | ORAL_TABLET | Freq: Two times a day (BID) | ORAL | Status: DC
  Administered 2021-11-25: 400 mg via ORAL
  Filled 2021-11-25: qty 2

## 2021-11-25 MED ORDER — THIAMINE HCL 100 MG PO TABS: 100.0000 mg | ORAL_TABLET | Freq: Every day | ORAL | 0 refills | Status: AC

## 2021-11-25 MED ORDER — METOPROLOL TARTRATE 25 MG PO TABS: 25.0000 mg | ORAL_TABLET | Freq: Two times a day (BID) | ORAL | Status: DC

## 2021-11-25 MED ORDER — METOPROLOL TARTRATE 25 MG PO TABS
12.5000 mg | ORAL_TABLET | Freq: Once | ORAL | Status: AC
  Administered 2021-11-25: 12.5 mg via ORAL
  Filled 2021-11-25: qty 1

## 2021-11-25 MED ORDER — ADULT MULTIVITAMIN W/MINERALS CH: 1.0000 | ORAL_TABLET | Freq: Every day | ORAL | 0 refills | Status: AC

## 2021-11-25 MED ORDER — METOPROLOL TARTRATE 25 MG PO TABS: 25.0000 mg | ORAL_TABLET | Freq: Two times a day (BID) | ORAL | 2 refills | Status: DC

## 2021-11-25 MED ORDER — ALPRAZOLAM 0.5 MG PO TABS: 0.5000 mg | ORAL_TABLET | Freq: Three times a day (TID) | ORAL | 0 refills | Status: DC

## 2021-11-25 MED ORDER — AMIODARONE HCL 200 MG PO TABS: 200.0000 mg | ORAL_TABLET | Freq: Every day | ORAL | Status: DC

## 2021-11-25 MED ORDER — AMIODARONE HCL 200 MG PO TABS: 200.0000 mg | ORAL_TABLET | Freq: Every day | ORAL | 1 refills | Status: DC

## 2021-11-25 MED ORDER — AMIODARONE HCL 400 MG PO TABS: 400.0000 mg | ORAL_TABLET | Freq: Two times a day (BID) | ORAL | 0 refills | Status: DC

## 2021-11-25 MED ORDER — METOPROLOL TARTRATE 25 MG PO TABS
12.5000 mg | ORAL_TABLET | Freq: Two times a day (BID) | ORAL | Status: DC
  Administered 2021-11-25: 12.5 mg via ORAL
  Filled 2021-11-25: qty 1

## 2021-11-25 NOTE — Progress Notes (Signed)
[place] NOTE       Patient ID: Collin Knight MRN: [id number] DOB/AGE: 11/01/1945 76 y.o.  Admit date: 11/21/2021 Referring Physician Dr. Pietro Cassis  Primary Physician [name]  Primary Cardiologist Dr. 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  He was found to be in A. fib with RVR and a diltiazem drip was started with good control of his heart rate.  Repeat echocardiogram showed his ejection fraction was found to be reduced to 20-25% and was previously greater than 55% 1 year ago.    Interval history: - Feels fine. This morning went back in to atrial fibrillation.  - No shortness of breath or chest pain.   Review of systems complete and found to be negative unless listed above   Past Medical History:  Diagnosis Date   Anxiety     Past Surgical History:  Procedure Laterality Date   COLONOSCOPY WITH PROPOFOL N/A 09/25/2021   Procedure: COLONOSCOPY WITH PROPOFOL;  Surgeon: [name], DO;  Location: [facility] ENDOSCOPY;  Service: Gastroenterology;  Laterality: N/A;   EYE SURGERY     cataract both eyes   PACEMAKER LEADLESS INSERTION N/A 09/30/2020   Procedure: PACEMAKER LEADLESS INSERTION;  Surgeon: Isaias Cowman, MD;  Location: [place] CV LAB;  Service: Cardiovascular;  Laterality: N/A;    Medications Prior to Admission  Medication Sig Dispense Refill Last Dose   budesonide (ENTOCORT EC) 3 MG 24 hr capsule Take 6-9 mg by mouth daily.   11/21/2021 at 0900   diazepam (VALIUM) 5 MG tablet Take 5 mg by mouth 2 (two) times daily as needed.   11/21/2021 at 0900    JANTOVEN 5 MG tablet Take 5 mg by mouth daily.   11/21/2021 at 0900   metoprolol succinate (TOPROL-XL) 25 MG 24 hr tablet Take 25 mg by mouth daily.   11/21/2021 at 0900   sertraline (ZOLOFT) 25 MG tablet Take 25 mg by mouth daily. (Patient not taking: Reported on 11/21/2021)   Not Taking    Social History   Socioeconomic History   Marital status: Married    Spouse name: Not on file   Number of children: Not on file   Years of education: Not on file   Highest education level: Not on file  Occupational History   Not on file  Tobacco Use   Smoking status: Never   Smokeless tobacco: Never  Vaping Use   Vaping Use: Never used  Substance and Sexual Activity   Alcohol use: Yes    Alcohol/week: 21.0 standard drinks    Types: 21 Cans of beer per week    Comment: 3 beers a day   Drug use: Never   Sexual activity: Yes  Other Topics Concern   Not on file  Social History Narrative   Not on file   Social Determinants of Health   Financial Resource Strain: Not on file  Food Insecurity: Not on file  Transportation Needs: Not on file  Physical Activity: Not on file  Stress: Not on file  Social Connections: Not on file  Intimate Partner Violence: Not on file    No family history on file.    Review  of systems complete and found to be negative unless listed above    PHYSICAL EXAM General: Elderly appearing caucasian male, in no acute distress. Sitting upright in PCU bed. HEENT:  Normocephalic and atraumatic. Hearing aid present in right ear, hard of hearing Neck:  No JVD.  Lungs: Normal respiratory effort on room air. Clear to ascultation bilaterally  Heart: Irregularly irregular. Normal S1 and S2 without gallops or murmurs. Radial & DP pulses 2+ bilaterally. Abdomen: Non-distended appearing.  Msk: Normal strength and tone for age. Extremities: Warm and well perfused. No clubbing, cyanosis. No LE edema. Neuro: Alert and oriented X 3. Psych: mood appropriate.   Labs:   Lab  Results  Component Value Date   WBC 7.3 11/25/2021   HGB 14.0 11/25/2021   HCT 42.0 11/25/2021   MCV 105.0 (H) 11/25/2021   PLT 300 11/25/2021    Recent Labs  Lab 11/21/21 1650 11/22/21 0711 11/25/21 0426  NA  --    < > 138  K  --    < > 3.7  CL  --    < > 104  CO2  --    < > 27  BUN  --    < > 24*  CREATININE  --    < > 1.07  CALCIUM  --    < > 8.5*  PROT 5.8*  --   --   BILITOT 1.0  --   --   ALKPHOS 47  --   --   ALT 28  --   --   AST 25  --   --   GLUCOSE  --    < > 84   < > = values in this interval not displayed.    No results found for: CKTOTAL, CKMB, CKMBINDEX, TROPONINI No results found for: CHOL No results found for: HDL No results found for: LDLCALC No results found for: TRIG No results found for: CHOLHDL No results found for: LDLDIRECT    Radiology: [facility] Chest Port 1 View  Result Date: 11/21/2021 CLINICAL DATA:  Chest pain, shortness of breath. EXAM: PORTABLE CHEST 1 VIEW COMPARISON:  September 29, 2020. FINDINGS: Stable cardiomediastinal silhouette. Mild bibasilar subsegmental atelectasis or edema is noted with probable small bilateral pleural effusions. The visualized skeletal structures are unremarkable. IMPRESSION: Mild bibasilar subsegmental atelectasis or edema is noted with probable small bilateral pleural effusions. Electronically Signed   By: Marijo Conception M.D.   On: 11/21/2021 15:32   ECHOCARDIOGRAM COMPLETE  Result Date: 11/22/2021    ECHOCARDIOGRAM REPORT   Patient Name:   Collin Knight Date of Exam: 11/22/2021 Medical Rec #:  [id number]  Height:       70.0 in Accession #:    [id number] Weight:       165.0 lb Date of Birth:  05-Nov-1945   BSA:          1.923 m Patient Age:    76 years   BP:           118/58 mmHg Patient Gender: M          HR:           108 bpm. Exam Location:  [facility] Procedure: 2D Echo, Color Doppler and Cardiac Doppler Indications:     R94.31 Abnormal ECG  History:         Patient has prior history of Echocardiogram examinations. CAD,                   Pacemaker,  Signs/Symptoms:Shortness of Breath and Fatigue; Risk                  Factors:Hypertension.  Sonographer:     Charmayne Sheer Referring Phys:  [id number] [facility] DAHAL Diagnosing Phys: Donnelly Angelica  Sonographer Comments: Suboptimal parasternal window. IMPRESSIONS  1. Left ventricular ejection fraction, by estimation, is 20 to 25%. The left ventricle has severely decreased function. The left ventricle demonstrates global hypokinesis. The left ventricular internal cavity size was mildly dilated. Left ventricular diastolic parameters are indeterminate.  2. Right ventricular systolic function is mildly reduced. The right ventricular size is mildly enlarged.  3. Left atrial size was mildly dilated.  4. Right atrial size was moderately dilated.  5. The mitral valve is normal in structure. No evidence of mitral valve regurgitation. No evidence of mitral stenosis.  6. The aortic valve is normal in structure. Aortic valve regurgitation is not visualized. No aortic stenosis is present.  7. The inferior vena cava is dilated in size with <50% respiratory variability, suggesting right atrial pressure of 15 mmHg. FINDINGS  Left Ventricle: Left ventricular ejection fraction, by estimation, is 20 to 25%. The left ventricle has severely decreased function. The left ventricle demonstrates global hypokinesis. The left ventricular internal cavity size was mildly dilated. There is no left ventricular hypertrophy. Left ventricular diastolic parameters are indeterminate. Right Ventricle: The right ventricular size is mildly enlarged. No increase in right ventricular wall thickness. Right ventricular systolic function is mildly reduced. Left Atrium: Left atrial size was mildly dilated. Right Atrium: Right atrial size was moderately dilated. Pericardium: There is no evidence of pericardial effusion. Mitral Valve: The mitral valve is normal in structure. No evidence of mitral valve regurgitation. No evidence of mitral valve stenosis. MV  peak gradient, 5.1 mmHg. The mean mitral valve gradient is 2.0 mmHg. Tricuspid Valve: The tricuspid valve is normal in structure. Tricuspid valve regurgitation is trivial. Aortic Valve: The aortic valve is normal in structure. Aortic valve regurgitation is not visualized. No aortic stenosis is present. Aortic valve mean gradient measures 2.0 mmHg. Aortic valve peak gradient measures 3.6 mmHg. Aortic valve area, by VTI measures 2.82 cm. Pulmonic Valve: The pulmonic valve was not well visualized. Aorta: The aortic root is normal in size and structure. Venous: The inferior vena cava is dilated in size with less than 50% respiratory variability, suggesting right atrial pressure of 15 mmHg. IAS/Shunts: The interatrial septum was not well visualized.  LEFT VENTRICLE PLAX 2D LVIDd:         5.63 cm   Diastology LVIDs:         4.33 cm   LV e' lateral:   5.22 cm/s LV PW:         1.15 cm   LV E/e' lateral: 18.0 LV IVS:        0.80 cm LVOT diam:     2.40 cm LV SV:         36 LV SV Index:   19 LVOT Area:     4.52 cm  RIGHT VENTRICLE RV Basal diam:  4.35 cm LEFT ATRIUM             Index        RIGHT ATRIUM           Index LA diam:        4.10 cm 2.13 cm/m   RA Area:     26.30 cm LA Vol (A2C):   60.5 ml 31.45 ml/m  RA Volume:   94.70  ml  49.23 ml/m LA Vol (A4C):   59.6 ml 30.99 ml/m LA Biplane Vol: 60.3 ml 31.35 ml/m  AORTIC VALVE                    PULMONIC VALVE AV Area (Vmax):    2.59 cm     PV Vmax:       0.59 m/s AV Area (Vmean):   2.70 cm     PV Vmean:      38.100 cm/s AV Area (VTI):     2.82 cm     PV VTI:        0.090 m AV Vmax:           94.40 cm/s   PV Peak grad:  1.4 mmHg AV Vmean:          62.400 cm/s  PV Mean grad:  1.0 mmHg AV VTI:            0.129 m AV Peak Grad:      3.6 mmHg AV Mean Grad:      2.0 mmHg LVOT Vmax:         54.10 cm/s LVOT Vmean:        37.200 cm/s LVOT VTI:          0.080 m LVOT/AV VTI ratio: 0.62  AORTA Ao Root diam: 3.50 cm MITRAL VALVE MV Area (PHT): 5.13 cm    SHUNTS MV Area VTI:    1.49 cm    Systemic VTI:  0.08 m MV Peak grad:  5.1 mmHg    Systemic Diam: 2.40 cm MV Mean grad:  2.0 mmHg MV Vmax:       1.13 m/s MV Vmean:      68.2 cm/s MV Decel Time: 148 msec MV E velocity: 94.05 cm/s Donnelly Angelica Electronically signed by Donnelly Angelica Signature Date/Time: 11/22/2021/1:01:37 PM    Final     ECHO 11/22/2021 LVEF 20-25% with severely decreased LV function.  LV global hypokinesis with mild dilation.  Mildly reduced RV function with mildly enlarged ventricular size.  Mild LA dilation, mild RA dilation.  -Previous echo 09/29/2020 revealed LVEF 50-55% with a low normal LV function and mild MR.   TELEMETRY reviewed by me: periods of junctional rhythm, paced beats, and sinus brady with vent bigeminy -rate controlled in the 60s.  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  He was found to be in A. fib with RVR and converted to NSR with amiodarone. Repeat echocardiogram was performed this morning and his ejection fraction was found to be reduced to 20-25% and was previously greater than 55% ~51yr year ago.    #HFrEF (11/22/21 LVEF 20-25%, previously greater than 55% 09/2020) #Paroxysmal atrial fibrillation with RVR on warfarin  #history of sinus pauses s/p micra pacemaker placement 09/2020 Pt reports history of slight DOE for the past 4 months or so. Had significant DOE and severe fatigue x 3 days prior to ED presentation. Troponin 69-66. BNP elevated to >800 with trace bialteral pleural effusions. Denies chest pain. His reduction in EF possibly  related to his  AF and tachycardia, etoh use, or a prior ischemic event, he certainly needs further cardiac work up, but this will be pending washout of warfarin (last given 2/1).  - Back in AF this morning. This will need to resolve or remain rate controlled for him to be able to be discharged. -Ischemic work-up: Patient was taking home warfarin while in the hospital despite trying to hold this medication, so he is otherwise stable for discharge I think this can be completed as an outpatient.  Compliance is going to be a major issue. -Continue amiodarone 400 mg twice daily through 11/28/21, then 200 mg daily. -Continue metoprolol tartrate 25 mg  BID  -Appears euvolemic, will hold on further diuresis -We can resume his warfarin, with a plan for him to pursue ischemic evaluation as an outpatient. -CHADSVASC 3 (age, CHF)   #alcohol abuse #anxiety -reportedly drinks 3-4 beers per day. Continue CIWA.  -strongly encouraged cessation from alcohol. Patient clearly disinterested in cessation.   Signed: Andrez Grime , MD 11/25/2021, 8:20 AM [facility]

## 2021-11-25 NOTE — Consult Note (Signed)
[place] for heparin Indication: atrial fibrillation  No Known Allergies  Patient Measurements: Height: 5\' 10"  (177.8 cm) Weight: 66.5 kg (146 lb 9.6 oz) IBW/kg (Calculated) : 73 Heparin Dosing Weight: 66.5 kg  Vital Signs: Temp: 97.6 F (36.4 C) (02/04 0700) Temp Source: Oral (02/04 0700) BP: 110/72 (02/04 0700) Pulse Rate: 59 (02/04 0700)  Labs: Recent Labs    11/23/21 0609 11/24/21 0724 11/25/21 0426  HGB 15.3 14.5 14.0  HCT 44.8 42.3 42.0  PLT 316 325 300  APTT 38* 42*  --   LABPROT 29.9* 42.7* 31.9*  INR 2.9* 4.5* 3.1*  CREATININE 0.98 1.27* 1.07     Estimated Creatinine Clearance: 53.5 mL/min (by C-G formula based on SCr of 1.07 mg/dL).   Medical History: Past Medical History:  Diagnosis Date   Anxiety     Medications:  PTA: Warfarin 5mg  every morning (Atrial Fibrillation), INR Goal 2-3 Inpatient: Warfarin Allergies: NO AC/APT related allergies  Assessment: 76yo male w/ h/o atrial fibrillation (on AC with VKA), HTN, anxiety, mild alcohol use disorder, history of CAD,  who presents emergency department for chief concerns of shortness of breath and fatigue. Pharmacy consulted for bridging of warfarin to heparin for afib in the setting of possible cardiology intervention.  2/1 INR subtherapeutic at 1.9. Pt received warfarin 6mg  x1 this morning  2/2 INR 2.9 2/3 INR 4.5 2/4 INR 3.1  Date Time aPTT/HL Rate/Comment    Therapy:  Heparin level 0.3-0.7 units/ml Monitor platelets by anticoagulation protocol: Yes   Plan:  No heparin infusion today 2/4. Continue to hold warfarin Check INR and aPTT tomorrow with AM labs prior to initiation of heparin drip Once INR <2, consider giving IV heparin 3300 units bolus once, then initiate IV heparin drip at 1000 units/hr   Noralee Space, PharmD Clinical Pharmacist   11/25/2021 9:02 AM

## 2021-11-25 NOTE — Progress Notes (Signed)
PROGRESS NOTE    Collin Knight  VQM:[id number] DOB: August 31, 1946 DOA: 11/21/2021 PCP: [name], MD   Brief Narrative:   Collin Knight is a 76 y.o. male with PMH significant for hypertension, A. fib on Coumadin, anxiety, chronic alcohol use, CAD who lives at home with his wife. Patient presented to the ED on 11/21/2021 with complaint of shortness of breath and fatigue. Patient has chronic anxiety and on as needed Valium.  Drinks about 3-4 beers a night.  For the last 1 week, patient has generalized weakness because of which he has been drinking less about 1 to 2 cans per night.   In the ED, patient was afebrile, heart rate elevated at 157, blood pressure 130/109, breathing on room air. Labs with troponin elevated to 69, WBC 8.3, D-dimer 0.39, INR 1.8 EKG with Patient was started on Cardizem drip, admitted to hospital service  -He has been weaned off of Cardizem drip and is currently on rate and rhythm control medications of metoprolol and amiodarone respectively per cardiology.  He continues to have some elevated heart rates at rest that is being managed by cardiology.  He is otherwise asymptomatic.  Assessment & Plan:   Principal Problem:   Atrial fibrillation with RVR (HCC) Active Problems:   Anxiety   Alcohol use disorder, mild, abuse   Shortness of breath   On warfarin therapy   A. fib with RVR Supratherapeutic INR -Patient has history of A. fib for about a year.  Home meds include metoprolol succinate 25 mg daily and Coumadin. -Presented in RVR with heart rate more than 150 and currently in the 110bpm range. -He was initially started on Cardizem drip.  Cardiology consult appreciated.  Later switched to amiodarone drip and now to amiodarone oral.  Metoprolol dose to 25 mg twice daily with extra dose of 12.5 mg given on 2/4 -INR still at 3.1 this morning despite Coumadin being on hold. -Patient and family denies self taking Coumadin.  No need of reversal at this time as he has no  active bleeding. -Cardiology plans for outpatient ischemic evaluation and he may discharge once heart rates are better controlled.   Acute systolic CHF -Echo from 06/24/2670 with EF 20 to 25%, previous echo from 12/21 with EF more than 55%. -Probably multifactorial: Ischemia, tachycardia, chronic alcoholism -Cardiology plans for ischemic evaluation as an outpatient once INR is less than 1.7. -Patient is euvolemic and further diuresis has been held   Acute respiratory failure with hypoxia -Patient does not use supplemental oxygen at home.  However he is on 3 to 4 L oxygen this morning with saturation between 90 and 92%. -New oxygen dependence is likely because of new CHF.   -Noted not requiring supplemental oxygen.  Per PT note, he was able to ambulate without supplemental oxygen today.   Anxiety disorder -Patient seems to have uncontrolled anxiety.  He says he is trying to cut down on Valium.  Not on any other meds.  I think anxiety is feeding to his chronic alcoholism.  -Patient has been weaning off Valium at home anticipate discontinuation upon discharge and to remain on Xanax as prescribed   Chronic alcoholism -Drinks about 3-4 beers a night, drinking less for last 1 week because of weakness. -Currently on CIWA protocol. -Strongly encourage cessation of alcohol   DVT prophylaxis:TED hose Code Status: Full Family Communication: Wife at bedside 2/4 Disposition Plan:  Status is: Inpatient Remains inpatient appropriate because: Need for further heart rate control with cardiology to adjust medications.  Consultants:  Cardiology  Procedures:  See below  Antimicrobials:  None   Subjective: Patient seen and evaluated today with no new acute complaints or concerns. No acute concerns or events noted overnight.  He continues to have elevated heart rates in the 110 bpm range while at rest.  He denies any chest pain, palpitations, or shortness of breath.   Objective: Vitals:    11/25/21 0059 11/25/21 0414 11/25/21 0700 11/25/21 1104  BP: 113/83 104/76 110/72 93/78  Pulse: (!) 56 (!) 58 (!) 59 (!) 104  Resp: 19 18  17   Temp: 98.2 F (36.8 C) 97.8 F (36.6 C) 97.6 F (36.4 C) 97.7 F (36.5 C)  TempSrc:   Oral Oral  SpO2: 97% 96% 96% 93%  Weight:      Height:        Intake/Output Summary (Last 24 hours) at 11/25/2021 1407 Last data filed at 11/25/2021 1300 Gross per 24 hour  Intake 1440 ml  Output --  Net 1440 ml   Filed Weights   11/21/21 1505 11/22/21 1606  Weight: 74.8 kg 66.5 kg    Examination:  General exam: Appears calm and comfortable  Respiratory system: Clear to auscultation. Respiratory effort normal.  On nasal cannula oxygen Cardiovascular system: S1 & S2 heard, irregular and tachycardic Gastrointestinal system: Abdomen is soft Central nervous system: Alert and awake Extremities: No edema Skin: No significant lesions noted Psychiatry: Flat affect.    Data Reviewed: I have personally reviewed following labs and imaging studies  CBC: Recent Labs  Lab 11/21/21 1504 11/22/21 0711 11/23/21 0609 11/24/21 0724 11/25/21 0426  WBC 8.3 9.9 8.1 7.4 7.3  NEUTROABS  --   --  5.5  --  4.6  HGB 15.3 15.1 15.3 14.5 14.0  HCT 45.5 44.6 44.8 42.3 42.0  MCV 105.8* 105.9* 102.3* 103.9* 105.0*  PLT 334 321 316 325 295   Basic Metabolic Panel: Recent Labs  Lab 11/21/21 1504 11/21/21 1650 11/21/21 1946 11/22/21 0711 11/23/21 0609 11/24/21 0724 11/25/21 0426  NA 135  --   --  139 137 135 138  K 4.3  --   --  3.9 3.7 3.7 3.7  CL 108  --   --  110 107 103 104  CO2 18*  --   --  17* 21* 21* 27  GLUCOSE 111*  --   --  87 87 96 84  BUN 23  --   --  20 18 29* 24*  CREATININE 0.96  --   --  0.84 0.98 1.27* 1.07  CALCIUM 8.8*  --   --  8.5* 8.3* 8.6* 8.5*  MG  --  2.0 1.9  --  1.7  --   --   PHOS  --   --  4.0  --  3.8  --   --    GFR: Estimated Creatinine Clearance: 53.5 mL/min (by C-G formula based on SCr of 1.07 mg/dL). Liver Function  Tests: Recent Labs  Lab 11/21/21 1650  AST 25  ALT 28  ALKPHOS 47  BILITOT 1.0  PROT 5.8*  ALBUMIN 3.2*   No results for input(s): LIPASE, AMYLASE in the last 168 hours. No results for input(s): AMMONIA in the last 168 hours. Coagulation Profile: Recent Labs  Lab 11/21/21 1650 11/22/21 0711 11/23/21 0609 11/24/21 0724 11/25/21 0426  INR 1.8* 1.9* 2.9* 4.5* 3.1*   Cardiac Enzymes: No results for input(s): CKTOTAL, CKMB, CKMBINDEX, TROPONINI in the last 168 hours. BNP (last 3 results) No results for  input(s): PROBNP in the last 8760 hours. HbA1C: No results for input(s): HGBA1C in the last 72 hours. CBG: No results for input(s): GLUCAP in the last 168 hours. Lipid Profile: No results for input(s): CHOL, HDL, LDLCALC, TRIG, CHOLHDL, LDLDIRECT in the last 72 hours. Thyroid Function Tests: No results for input(s): TSH, T4TOTAL, FREET4, T3FREE, THYROIDAB in the last 72 hours. Anemia Panel: No results for input(s): VITAMINB12, FOLATE, FERRITIN, TIBC, IRON, RETICCTPCT in the last 72 hours. Sepsis Labs: Recent Labs  Lab 11/21/21 1946  PROCALCITON <0.10    Recent Results (from the past 240 hour(s))  Resp Panel by RT-PCR (Flu A&B, Covid) Nasopharyngeal Swab     Status: None   Collection Time: 11/21/21  3:26 PM   Specimen: Nasopharyngeal Swab; Nasopharyngeal(NP) swabs in vial transport medium  Result Value Ref Range Status   SARS Coronavirus 2 by RT PCR NEGATIVE NEGATIVE Final    Comment: (NOTE) SARS-CoV-2 target nucleic acids are NOT DETECTED.  The SARS-CoV-2 RNA is generally detectable in upper respiratory specimens during the acute phase of infection. The lowest concentration of SARS-CoV-2 viral copies this assay can detect is 138 copies/mL. A negative result does not preclude SARS-Cov-2 infection and should not be used as the sole basis for treatment or other patient management decisions. A negative result may occur with  improper specimen collection/handling,  submission of specimen other than nasopharyngeal swab, presence of viral mutation(s) within the areas targeted by this assay, and inadequate number of viral copies(<138 copies/mL). A negative result must be combined with clinical observations, patient history, and epidemiological information. The expected result is Negative.  Fact Sheet for Patients:  EntrepreneurPulse.com.au  Fact Sheet for Healthcare Providers:  IncredibleEmployment.be  This test is no t yet approved or cleared by the [place] FDA and  has been authorized for detection and/or diagnosis of SARS-CoV-2 by FDA under an Emergency Use Authorization (EUA). This EUA will remain  in effect (meaning this test can be used) for the duration of the COVID-19 declaration under Section 564(b)(1) of the Act, 21 U.S.C.section 360bbb-3(b)(1), unless the authorization is terminated  or revoked sooner.       Influenza A by PCR NEGATIVE NEGATIVE Final   Influenza B by PCR NEGATIVE NEGATIVE Final    Comment: (NOTE) The Xpert Xpress SARS-CoV-2/FLU/RSV plus assay is intended as an aid in the diagnosis of influenza from Nasopharyngeal swab specimens and should not be used as a sole basis for treatment. Nasal washings and aspirates are unacceptable for Xpert Xpress SARS-CoV-2/FLU/RSV testing.  Fact Sheet for Patients: EntrepreneurPulse.com.au  Fact Sheet for Healthcare Providers: IncredibleEmployment.be  This test is not yet approved or cleared by the [place] FDA and has been authorized for detection and/or diagnosis of SARS-CoV-2 by FDA under an Emergency Use Authorization (EUA). This EUA will remain in effect (meaning this test can be used) for the duration of the COVID-19 declaration under Section 564(b)(1) of the Act, 21 U.S.C. section 360bbb-3(b)(1), unless the authorization is terminated or revoked.  Performed at [facility], [address], [place], [place] [address]          Radiology Studies: No results found.      Scheduled Meds:  ALPRAZolam  0.5 mg Oral TID   amiodarone  400 mg Oral BID   Followed by   Derrill Memo ON 11/29/2021] amiodarone  200 mg Oral Daily   budesonide  6 mg Oral Daily   folic acid  1 mg Oral Daily   metoprolol tartrate  25  mg Oral BID   multivitamin with minerals  1 tablet Oral Daily   thiamine  100 mg Oral Daily   Or   thiamine  100 mg Intravenous Daily     LOS: 3 days    Time spent: 35 minutes    Tasha Diaz Darleen Crocker, DO [facility]  If 7PM-7AM, please contact night-coverage [email or web address] 11/25/2021, 2:07 PM

## 2021-11-25 NOTE — TOC Progression Note (Signed)
Transition of Care [facility]) - Progression Note    Patient Details  Name: Collin Knight MRN: [id number] Date of Birth: 12/18/1945  Transition of Care [facility] CM/SW [place], [place] Phone Number: 11/25/2021, 1:03 PM  Clinical Narrative:     Patient is set up with [facility] for PT and RN. SOC will be Tuesday 2/7 if patient discharges over the weekend, [place] orders have been requested from MD.   No DME needs.   TOC will continue to follow for needs.   Expected Discharge Plan: [place] Barriers to Discharge: Continued Medical Work up  Expected Discharge Plan and Services Expected Discharge Plan: [place]: PT, RN [facility] Agency: [place] ([facility]) Date [place]: 11/23/21 Time [place]: [phone] Representative spoke with at [place]: [place] ([place]) Interventions    Readmission Risk Interventions No flowsheet data found.

## 2021-11-25 NOTE — Discharge Summary (Signed)
Physician Discharge Summary  Collin Knight JJK:[id number] DOB: 1946-07-29 DOA: 11/21/2021  PCP: Collin Pink, MD  Admit date: 11/21/2021  Discharge date: 11/25/2021  Admitted From:Home  Disposition:  Home  Recommendations for Outpatient Follow-up:  Follow up with PCP in 1-2 weeks Follow-up with cardiology outpatient for consideration of catheterization Continue warfarin as prior as well as metoprolol 25 mg twice daily and amiodarone as ordered Continue on Xanax 3 times daily with 20 tablets ordered and 0 refills.  Discontinue Valium. Continue other home medications as prior  Home Health: Yes with RN, PT  Equipment/Devices: None, no longer on nasal cannula  Discharge Condition:Stable  CODE STATUS: Full  Diet recommendation: Heart Healthy, discussed cessation of alcohol use  Brief/Interim Summary: Per HPI: Collin Knight is a 76 y.o. male with PMH significant for hypertension, A. fib on Coumadin, anxiety, chronic alcohol use, CAD who lives at home with his wife. Patient presented to the ED on 11/21/2021 with complaint of shortness of breath and fatigue. Patient has chronic anxiety and on as needed Valium.  Drinks about 3-4 beers a night.  For the last 1 week, patient has generalized weakness because of which he has been drinking less about 1 to 2 cans per night.  -Patient was initially started on Cardizem drip on account of his atrial fibrillation with RVR.  He was gradually weaned off of Cardizem drip and has just now converted to sinus rhythm while on amiodarone and metoprolol.  He was also noted to have acute systolic CHF and cardiology was planning for ischemic evaluation with catheterization during the course of this admission, however his INR had remained elevated.  He is noted to have an LVEF of 20-25% which is quite changed from his prior echo from 12/21 with EF more than 55%.  He received IV Lasix for diuresis and is now euvolemic.  Plans are now for outpatient cardiac catheterization  per his usual cardiologist at [facility].  He is to continue his heart rate control medications as otherwise ordered and has been encouraged to discontinue his alcohol use.  Discharge Diagnoses:  Principal Problem:   Atrial fibrillation with RVR (HCC) Active Problems:   Anxiety   Alcohol use disorder, mild, abuse   Shortness of breath   On warfarin therapy  Principal discharge diagnosis: Atrial fibrillation with RVR along with acute systolic CHF exacerbation.  Discharge Instructions  Discharge Instructions     Diet - low sodium heart healthy   Complete by: As directed    Increase activity slowly   Complete by: As directed       Allergies as of 11/25/2021   No Known Allergies      Medication List     STOP taking these medications    diazepam 5 MG tablet Commonly known as: VALIUM   metoprolol succinate 25 MG 24 hr tablet Commonly known as: TOPROL-XL       TAKE these medications    ALPRAZolam 0.5 MG tablet Commonly known as: XANAX Take 1 tablet (0.5 mg total) by mouth 3 (three) times daily.   amiodarone 400 MG tablet Commonly known as: PACERONE Take 1 tablet (400 mg total) by mouth 2 (two) times daily for 3 days.   amiodarone 200 MG tablet Commonly known as: PACERONE Take 1 tablet (200 mg total) by mouth daily. Start taking on: November 29, 2021   budesonide 3 MG 24 hr capsule Commonly known as: ENTOCORT EC Take 6-9 mg by mouth daily.   Jantoven 5 MG tablet Generic drug: warfarin  Take 5 mg by mouth daily.   metoprolol tartrate 25 MG tablet Commonly known as: LOPRESSOR Take 1 tablet (25 mg total) by mouth 2 (two) times daily.   multivitamin with minerals Tabs tablet Take 1 tablet by mouth daily. Start taking on: November 26, 2021   sertraline 25 MG tablet Commonly known as: ZOLOFT Take 25 mg by mouth daily.   thiamine 100 MG tablet Take 1 tablet (100 mg total) by mouth daily. Start taking on: November 26, 2021        Follow-up Information      Collin Pink, MD. Schedule an appointment as soon as possible for a visit in 1 week(s).   Specialty: Family Medicine Contact information: [address] [place] [address] [phone]         Collin Cowman, MD. Schedule an appointment as soon as possible for a visit.   Specialty: Cardiology Contact information: [facility] [place] [address] [phone]                No Known Allergies  Consultations: Cardiology   Procedures/Studies: [facility] Chest Port 1 View  Result Date: 11/21/2021 CLINICAL DATA:  Chest pain, shortness of breath. EXAM: PORTABLE CHEST 1 VIEW COMPARISON:  September 29, 2020. FINDINGS: Stable cardiomediastinal silhouette. Mild bibasilar subsegmental atelectasis or edema is noted with probable small bilateral pleural effusions. The visualized skeletal structures are unremarkable. IMPRESSION: Mild bibasilar subsegmental atelectasis or edema is noted with probable small bilateral pleural effusions. Electronically Signed   By: Collin Knight M.D.   On: 11/21/2021 15:32   ECHOCARDIOGRAM COMPLETE  Result Date: 11/22/2021    ECHOCARDIOGRAM REPORT   Patient Name:   Collin Knight Date of Exam: 11/22/2021 Medical Rec #:  [id number]  Height:       70.0 in Accession #:    [id number] Weight:       165.0 lb Date of Birth:  June 13, 1946   BSA:          1.923 m Patient Age:    45 years   BP:           118/58 mmHg Patient Gender: M          HR:           108 bpm. Exam Location:  [facility] Procedure: 2D Echo, Color Doppler and Cardiac Doppler Indications:     R94.31 Abnormal ECG  History:         Patient has prior history of Echocardiogram examinations. CAD,                  Pacemaker, Signs/Symptoms:Shortness of Breath and Fatigue; Risk                  Factors:Hypertension.  Sonographer:     Charmayne Sheer Referring Phys:  [id number] [facility] DAHAL Diagnosing Phys: Collin Knight  Sonographer Comments: Suboptimal parasternal window.  IMPRESSIONS  1. Left ventricular ejection fraction, by estimation, is 20 to 25%. The left ventricle has severely decreased function. The left ventricle demonstrates global hypokinesis. The left ventricular internal cavity size was mildly dilated. Left ventricular diastolic parameters are indeterminate.  2. Right ventricular systolic function is mildly reduced. The right ventricular size is mildly enlarged.  3. Left atrial size was mildly dilated.  4. Right atrial size was moderately dilated.  5. The mitral valve is normal in structure. No evidence of mitral valve regurgitation. No evidence of mitral stenosis.  6. The aortic valve  is normal in structure. Aortic valve regurgitation is not visualized. No aortic stenosis is present.  7. The inferior vena cava is dilated in size with <50% respiratory variability, suggesting right atrial pressure of 15 mmHg. FINDINGS  Left Ventricle: Left ventricular ejection fraction, by estimation, is 20 to 25%. The left ventricle has severely decreased function. The left ventricle demonstrates global hypokinesis. The left ventricular internal cavity size was mildly dilated. There is no left ventricular hypertrophy. Left ventricular diastolic parameters are indeterminate. Right Ventricle: The right ventricular size is mildly enlarged. No increase in right ventricular wall thickness. Right ventricular systolic function is mildly reduced. Left Atrium: Left atrial size was mildly dilated. Right Atrium: Right atrial size was moderately dilated. Pericardium: There is no evidence of pericardial effusion. Mitral Valve: The mitral valve is normal in structure. No evidence of mitral valve regurgitation. No evidence of mitral valve stenosis. MV peak gradient, 5.1 mmHg. The mean mitral valve gradient is 2.0 mmHg. Tricuspid Valve: The tricuspid valve is normal in structure. Tricuspid valve regurgitation is trivial. Aortic Valve: The aortic valve is normal in structure. Aortic valve regurgitation is  not visualized. No aortic stenosis is present. Aortic valve mean gradient measures 2.0 mmHg. Aortic valve peak gradient measures 3.6 mmHg. Aortic valve area, by VTI measures 2.82 cm. Pulmonic Valve: The pulmonic valve was not well visualized. Aorta: The aortic root is normal in size and structure. Venous: The inferior vena cava is dilated in size with less than 50% respiratory variability, suggesting right atrial pressure of 15 mmHg. IAS/Shunts: The interatrial septum was not well visualized.  LEFT VENTRICLE PLAX 2D LVIDd:         5.63 cm   Diastology LVIDs:         4.33 cm   LV e' lateral:   5.22 cm/s LV PW:         1.15 cm   LV E/e' lateral: 18.0 LV IVS:        0.80 cm LVOT diam:     2.40 cm LV SV:         36 LV SV Index:   19 LVOT Area:     4.52 cm  RIGHT VENTRICLE RV Basal diam:  4.35 cm LEFT ATRIUM             Index        RIGHT ATRIUM           Index LA diam:        4.10 cm 2.13 cm/m   RA Area:     26.30 cm LA Vol (A2C):   60.5 ml 31.45 ml/m  RA Volume:   94.70 ml  49.23 ml/m LA Vol (A4C):   59.6 ml 30.99 ml/m LA Biplane Vol: 60.3 ml 31.35 ml/m  AORTIC VALVE                    PULMONIC VALVE AV Area (Vmax):    2.59 cm     PV Vmax:       0.59 m/s AV Area (Vmean):   2.70 cm     PV Vmean:      38.100 cm/s AV Area (VTI):     2.82 cm     PV VTI:        0.090 m AV Vmax:           94.40 cm/s   PV Peak grad:  1.4 mmHg AV Vmean:          62.400 cm/s  PV Mean grad:  1.0 mmHg AV VTI:            0.129 m AV Peak Grad:      3.6 mmHg AV Mean Grad:      2.0 mmHg LVOT Vmax:         54.10 cm/s LVOT Vmean:        37.200 cm/s LVOT VTI:          0.080 m LVOT/AV VTI ratio: 0.62  AORTA Ao Root diam: 3.50 cm MITRAL VALVE MV Area (PHT): 5.13 cm    SHUNTS MV Area VTI:   1.49 cm    Systemic VTI:  0.08 m MV Peak grad:  5.1 mmHg    Systemic Diam: 2.40 cm MV Mean grad:  2.0 mmHg MV Vmax:       1.13 m/s MV Vmean:      68.2 cm/s MV Decel Time: 148 msec MV E velocity: 94.05 cm/s Collin Knight Electronically signed by Collin Knight  Signature Date/Time: 11/22/2021/1:01:37 PM    Final      Discharge Exam: Vitals:   11/25/21 1104 11/25/21 1500  BP: 93/78   Pulse: (!) 104 (!) 55  Resp: 17 16  Temp: 97.7 F (36.5 C)   SpO2: 93% 97%   Vitals:   11/25/21 0414 11/25/21 0700 11/25/21 1104 11/25/21 1500  BP: 104/76 110/72 93/78   Pulse: (!) 58 (!) 59 (!) 104 (!) 55  Resp: 18  17 16   Temp: 97.8 F (36.6 C) 97.6 F (36.4 C) 97.7 F (36.5 C)   TempSrc:  Oral Oral   SpO2: 96% 96% 93% 97%  Weight:      Height:        General: Pt is alert, awake, not in acute distress Cardiovascular: RRR, S1/S2 +, no rubs, no gallops Respiratory: CTA bilaterally, no wheezing, no rhonchi Abdominal: Soft, NT, ND, bowel sounds + Extremities: no edema, no cyanosis    The results of significant diagnostics from this hospitalization (including imaging, microbiology, ancillary and laboratory) are listed below for reference.     Microbiology: Recent Results (from the past 240 hour(s))  Resp Panel by RT-PCR (Flu A&B, Covid) Nasopharyngeal Swab     Status: None   Collection Time: 11/21/21  3:26 PM   Specimen: Nasopharyngeal Swab; Nasopharyngeal(NP) swabs in vial transport medium  Result Value Ref Range Status   SARS Coronavirus 2 by RT PCR NEGATIVE NEGATIVE Final    Comment: (NOTE) SARS-CoV-2 target nucleic acids are NOT DETECTED.  The SARS-CoV-2 RNA is generally detectable in upper respiratory specimens during the acute phase of infection. The lowest concentration of SARS-CoV-2 viral copies this assay can detect is 138 copies/mL. A negative result does not preclude SARS-Cov-2 infection and should not be used as the sole basis for treatment or other patient management decisions. A negative result may occur with  improper specimen collection/handling, submission of specimen other than nasopharyngeal swab, presence of viral mutation(s) within the areas targeted by this assay, and inadequate number of viral copies(<138 copies/mL). A  negative result must be combined with clinical observations, patient history, and epidemiological information. The expected result is Negative.  Fact Sheet for Patients:  EntrepreneurPulse.com.au  Fact Sheet for Healthcare Providers:  IncredibleEmployment.be  This test is no t yet approved or cleared by the [place] FDA and  has been authorized for detection and/or diagnosis of SARS-CoV-2 by FDA under an Emergency Use Authorization (EUA). This EUA will remain  in effect (meaning this test can be used)  for the duration of the COVID-19 declaration under Section 564(b)(1) of the Act, 21 U.S.C.section 360bbb-3(b)(1), unless the authorization is terminated  or revoked sooner.       Influenza A by PCR NEGATIVE NEGATIVE Final   Influenza B by PCR NEGATIVE NEGATIVE Final    Comment: (NOTE) The Xpert Xpress SARS-CoV-2/FLU/RSV plus assay is intended as an aid in the diagnosis of influenza from Nasopharyngeal swab specimens and should not be used as a sole basis for treatment. Nasal washings and aspirates are unacceptable for Xpert Xpress SARS-CoV-2/FLU/RSV testing.  Fact Sheet for Patients: EntrepreneurPulse.com.au  Fact Sheet for Healthcare Providers: IncredibleEmployment.be  This test is not yet approved or cleared by the [place] FDA and has been authorized for detection and/or diagnosis of SARS-CoV-2 by FDA under an Emergency Use Authorization (EUA). This EUA will remain in effect (meaning this test can be used) for the duration of the COVID-19 declaration under Section 564(b)(1) of the Act, 21 U.S.C. section 360bbb-3(b)(1), unless the authorization is terminated or revoked.  Performed at [facility], [address] [place], [place] [address]      Labs: BNP (last 3 results) Recent Labs    11/22/21 0711  BNP 315.4*   Basic Metabolic Panel: Recent Labs  Lab 11/21/21 1504  11/21/21 1650 11/21/21 1946 11/22/21 0711 11/23/21 0609 11/24/21 0724 11/25/21 0426  NA 135  --   --  139 137 135 138  K 4.3  --   --  3.9 3.7 3.7 3.7  CL 108  --   --  110 107 103 104  CO2 18*  --   --  17* 21* 21* 27  GLUCOSE 111*  --   --  87 87 96 84  BUN 23  --   --  20 18 29* 24*  CREATININE 0.96  --   --  0.84 0.98 1.27* 1.07  CALCIUM 8.8*  --   --  8.5* 8.3* 8.6* 8.5*  MG  --  2.0 1.9  --  1.7  --   --   PHOS  --   --  4.0  --  3.8  --   --    Liver Function Tests: Recent Labs  Lab 11/21/21 1650  AST 25  ALT 28  ALKPHOS 47  BILITOT 1.0  PROT 5.8*  ALBUMIN 3.2*   No results for input(s): LIPASE, AMYLASE in the last 168 hours. No results for input(s): AMMONIA in the last 168 hours. CBC: Recent Labs  Lab 11/21/21 1504 11/22/21 0711 11/23/21 0609 11/24/21 0724 11/25/21 0426  WBC 8.3 9.9 8.1 7.4 7.3  NEUTROABS  --   --  5.5  --  4.6  HGB 15.3 15.1 15.3 14.5 14.0  HCT 45.5 44.6 44.8 42.3 42.0  MCV 105.8* 105.9* 102.3* 103.9* 105.0*  PLT 334 321 316 325 300   Cardiac Enzymes: No results for input(s): CKTOTAL, CKMB, CKMBINDEX, TROPONINI in the last 168 hours. BNP: Invalid input(s): POCBNP CBG: No results for input(s): GLUCAP in the last 168 hours. D-Dimer No results for input(s): DDIMER in the last 72 hours. Hgb A1c No results for input(s): HGBA1C in the last 72 hours. Lipid Profile No results for input(s): CHOL, HDL, LDLCALC, TRIG, CHOLHDL, LDLDIRECT in the last 72 hours. Thyroid function studies No results for input(s): TSH, T4TOTAL, T3FREE, THYROIDAB in the last 72 hours.  Invalid input(s): FREET3 Anemia work up No results for input(s): VITAMINB12, FOLATE, FERRITIN, TIBC, IRON, RETICCTPCT in the last 72 hours. Urinalysis No results  found for: COLORURINE, APPEARANCEUR, [place], [place], [place], [place], BILIRUBINUR, [place], PROTEINUR, UROBILINOGEN, NITRITE, LEUKOCYTESUR Sepsis Labs Invalid input(s): PROCALCITONIN,  WBC,   LACTICIDVEN Microbiology Recent Results (from the past 240 hour(s))  Resp Panel by RT-PCR (Flu A&B, Covid) Nasopharyngeal Swab     Status: None   Collection Time: 11/21/21  3:26 PM   Specimen: Nasopharyngeal Swab; Nasopharyngeal(NP) swabs in vial transport medium  Result Value Ref Range Status   SARS Coronavirus 2 by RT PCR NEGATIVE NEGATIVE Final    Comment: (NOTE) SARS-CoV-2 target nucleic acids are NOT DETECTED.  The SARS-CoV-2 RNA is generally detectable in upper respiratory specimens during the acute phase of infection. The lowest concentration of SARS-CoV-2 viral copies this assay can detect is 138 copies/mL. A negative result does not preclude SARS-Cov-2 infection and should not be used as the sole basis for treatment or other patient management decisions. A negative result may occur with  improper specimen collection/handling, submission of specimen other than nasopharyngeal swab, presence of viral mutation(s) within the areas targeted by this assay, and inadequate number of viral copies(<138 copies/mL). A negative result must be combined with clinical observations, patient history, and epidemiological information. The expected result is Negative.  Fact Sheet for Patients:  EntrepreneurPulse.com.au  Fact Sheet for Healthcare Providers:  IncredibleEmployment.be  This test is no t yet approved or cleared by the [place] FDA and  has been authorized for detection and/or diagnosis of SARS-CoV-2 by FDA under an Emergency Use Authorization (EUA). This EUA will remain  in effect (meaning this test can be used) for the duration of the COVID-19 declaration under Section 564(b)(1) of the Act, 21 U.S.C.section 360bbb-3(b)(1), unless the authorization is terminated  or revoked sooner.       Influenza A by PCR NEGATIVE NEGATIVE Final   Influenza B by PCR NEGATIVE NEGATIVE Final    Comment: (NOTE) The Xpert Xpress SARS-CoV-2/FLU/RSV plus  assay is intended as an aid in the diagnosis of influenza from Nasopharyngeal swab specimens and should not be used as a sole basis for treatment. Nasal washings and aspirates are unacceptable for Xpert Xpress SARS-CoV-2/FLU/RSV testing.  Fact Sheet for Patients: EntrepreneurPulse.com.au  Fact Sheet for Healthcare Providers: IncredibleEmployment.be  This test is not yet approved or cleared by the [place] FDA and has been authorized for detection and/or diagnosis of SARS-CoV-2 by FDA under an Emergency Use Authorization (EUA). This EUA will remain in effect (meaning this test can be used) for the duration of the COVID-19 declaration under Section 564(b)(1) of the Act, 21 U.S.C. section 360bbb-3(b)(1), unless the authorization is terminated or revoked.  Performed at [facility], [address] [place], [place] [address]      Time coordinating discharge: 35 minutes  SIGNED:   Rodena Goldmann, DO [facility] 11/25/2021, 3:30 PM  If 7PM-7AM, please contact night-coverage [email or web address]

## 2021-12-05 ENCOUNTER — Inpatient Hospital Stay
Admission: EM | Admit: 2021-12-05 | Discharge: 2021-12-07 | DRG: 291 | Disposition: A | Discharge status: Home / Self Care | Payer: Medicare Other | Attending: Internal Medicine | Admitting: Internal Medicine

## 2021-12-05 ENCOUNTER — Encounter: Payer: Self-pay | Admitting: Emergency Medicine

## 2021-12-05 ENCOUNTER — Other Ambulatory Visit: Payer: Self-pay

## 2021-12-05 ENCOUNTER — Emergency Department: Payer: Medicare Other

## 2021-12-05 DIAGNOSIS — Z7901 Long term (current) use of anticoagulants: Secondary | ICD-10-CM | POA: Diagnosis not present

## 2021-12-05 DIAGNOSIS — I482 Chronic atrial fibrillation, unspecified: Secondary | ICD-10-CM | POA: Diagnosis present

## 2021-12-05 DIAGNOSIS — F419 Anxiety disorder, unspecified: Secondary | ICD-10-CM

## 2021-12-05 DIAGNOSIS — I110 Hypertensive heart disease with heart failure: Principal | ICD-10-CM | POA: Diagnosis present

## 2021-12-05 DIAGNOSIS — I5023 Acute on chronic systolic (congestive) heart failure: Secondary | ICD-10-CM | POA: Diagnosis present

## 2021-12-05 DIAGNOSIS — I480 Paroxysmal atrial fibrillation: Secondary | ICD-10-CM | POA: Diagnosis present

## 2021-12-05 DIAGNOSIS — Z79899 Other long term (current) drug therapy: Secondary | ICD-10-CM | POA: Diagnosis not present

## 2021-12-05 DIAGNOSIS — F1010 Alcohol abuse, uncomplicated: Secondary | ICD-10-CM | POA: Diagnosis present

## 2021-12-05 DIAGNOSIS — R079 Chest pain, unspecified: Secondary | ICD-10-CM | POA: Diagnosis not present

## 2021-12-05 DIAGNOSIS — F418 Other specified anxiety disorders: Secondary | ICD-10-CM | POA: Diagnosis present

## 2021-12-05 DIAGNOSIS — Z950 Presence of cardiac pacemaker: Secondary | ICD-10-CM | POA: Diagnosis not present

## 2021-12-05 DIAGNOSIS — K52832 Lymphocytic colitis: Secondary | ICD-10-CM | POA: Diagnosis present

## 2021-12-05 DIAGNOSIS — R778 Other specified abnormalities of plasma proteins: Secondary | ICD-10-CM | POA: Diagnosis present

## 2021-12-05 DIAGNOSIS — Z20822 Contact with and (suspected) exposure to covid-19: Secondary | ICD-10-CM | POA: Diagnosis present

## 2021-12-05 DIAGNOSIS — R0602 Shortness of breath: Secondary | ICD-10-CM | POA: Diagnosis present

## 2021-12-05 DIAGNOSIS — R7989 Other specified abnormal findings of blood chemistry: Secondary | ICD-10-CM | POA: Diagnosis present

## 2021-12-05 HISTORY — DX: Chronic atrial fibrillation, unspecified: I48.20

## 2021-12-05 HISTORY — DX: Presence of cardiac pacemaker: Z95.0

## 2021-12-05 HISTORY — DX: Sick sinus syndrome: I49.5

## 2021-12-05 HISTORY — DX: Chronic systolic (congestive) heart failure: I50.22

## 2021-12-05 LAB — BASIC METABOLIC PANEL
Anion gap: 8 (ref 5–15)
BUN: 28 mg/dL — ABNORMAL HIGH (ref 8–23)
CO2: 19 mmol/L — ABNORMAL LOW (ref 22–32)
Calcium: 8.7 mg/dL — ABNORMAL LOW (ref 8.9–10.3)
Chloride: 110 mmol/L (ref 98–111)
Creatinine, Ser: 1.11 mg/dL (ref 0.61–1.24)
GFR, Estimated: >60 mL/min (ref >60)
Glucose, Bld: 118 mg/dL — ABNORMAL HIGH (ref 70–99)
Potassium: 3.9 mmol/L (ref 3.5–5.1)
Sodium: 137 mmol/L (ref 135–145)

## 2021-12-05 LAB — CBC WITH DIFFERENTIAL/PLATELET
Abs Immature Granulocytes: 0.05 10*3/uL (ref 0.00–0.07)
Basophils Absolute: 0 10*3/uL (ref 0.0–0.1)
Basophils Relative: 0 %
Eosinophils Absolute: 0.1 10*3/uL (ref 0.0–0.5)
Eosinophils Relative: 2 %
HCT: 43.3 % (ref 39.0–52.0)
Hemoglobin: 14.4 g/dL (ref 13.0–17.0)
Immature Granulocytes: 1 %
Lymphocytes Relative: 23 %
Lymphs Abs: 1.8 10*3/uL (ref 0.7–4.0)
MCH: 35.4 pg — ABNORMAL HIGH (ref 26.0–34.0)
MCHC: 33.3 g/dL (ref 30.0–36.0)
MCV: 106.4 fL — ABNORMAL HIGH (ref 80.0–100.0)
Monocytes Absolute: 0.9 10*3/uL (ref 0.1–1.0)
Monocytes Relative: 12 %
Neutro Abs: 4.9 10*3/uL (ref 1.7–7.7)
Neutrophils Relative %: 62 %
Platelets: 327 10*3/uL (ref 150–400)
RBC: 4.07 MIL/uL — ABNORMAL LOW (ref 4.22–5.81)
RDW: 13.8 % (ref 11.5–15.5)
WBC: 7.7 10*3/uL (ref 4.0–10.5)
nRBC: 0 % (ref 0.0–0.2)

## 2021-12-05 LAB — HEMOGLOBIN A1C
Hgb A1c MFr Bld: 5.7 % — ABNORMAL HIGH (ref 4.8–5.6)
Mean Plasma Glucose: 116.89 mg/dL

## 2021-12-05 LAB — PROTIME-INR
INR: 3.5 — ABNORMAL HIGH (ref 0.8–1.2)
Prothrombin Time: 35.3 seconds — ABNORMAL HIGH (ref 11.4–15.2)

## 2021-12-05 LAB — MAGNESIUM: Magnesium: 2 mg/dL (ref 1.7–2.4)

## 2021-12-05 LAB — TROPONIN I (HIGH SENSITIVITY)
Troponin I (High Sensitivity): 37 ng/L — ABNORMAL HIGH (ref <18)
Troponin I (High Sensitivity): 35 ng/L — ABNORMAL HIGH (ref <18)
Troponin I (High Sensitivity): 34 ng/L — ABNORMAL HIGH (ref <18)
Troponin I (High Sensitivity): 32 ng/L — ABNORMAL HIGH (ref <18)

## 2021-12-05 LAB — BRAIN NATRIURETIC PEPTIDE: B Natriuretic Peptide: 1766.5 pg/mL — ABNORMAL HIGH (ref 0.0–100.0)

## 2021-12-05 MED ORDER — WARFARIN SODIUM 2 MG PO TABS
2.0000 mg | ORAL_TABLET | Freq: Once | ORAL | Status: AC
  Administered 2021-12-05: 2 mg via ORAL
  Filled 2021-12-05: qty 1

## 2021-12-05 MED ORDER — LORAZEPAM 1 MG PO TABS: 1.0000 mg | ORAL_TABLET | ORAL | Status: DC | PRN

## 2021-12-05 MED ORDER — LORAZEPAM 2 MG/ML IJ SOLN: 1.0000 mg | INTRAMUSCULAR | Status: DC | PRN

## 2021-12-05 MED ORDER — FUROSEMIDE 10 MG/ML IJ SOLN
20.0000 mg | Freq: Once | INTRAMUSCULAR | Status: AC
  Administered 2021-12-05: 20 mg via INTRAVENOUS
  Filled 2021-12-05: qty 4

## 2021-12-05 MED ORDER — ACETAMINOPHEN 325 MG PO TABS: 650.0000 mg | ORAL_TABLET | Freq: Four times a day (QID) | ORAL | Status: DC | PRN

## 2021-12-05 MED ORDER — DM-GUAIFENESIN ER 30-600 MG PO TB12: 1.0000 | ORAL_TABLET | Freq: Two times a day (BID) | ORAL | Status: DC | PRN

## 2021-12-05 MED ORDER — ALBUTEROL SULFATE (2.5 MG/3ML) 0.083% IN NEBU: 2.5000 mg | INHALATION_SOLUTION | RESPIRATORY_TRACT | Status: DC | PRN

## 2021-12-05 MED ORDER — WARFARIN - PHARMACIST DOSING INPATIENT: Freq: Every day | Status: DC

## 2021-12-05 MED ORDER — ALPRAZOLAM 0.5 MG PO TABS: 0.5000 mg | ORAL_TABLET | Freq: Three times a day (TID) | ORAL | Status: DC

## 2021-12-05 MED ORDER — BUDESONIDE 3 MG PO CPEP
6.0000 mg | ORAL_CAPSULE | Freq: Every day | ORAL | Status: DC
  Administered 2021-12-05 – 2021-12-07 (×3): 9 mg via ORAL
  Filled 2021-12-05 (×3): qty 3

## 2021-12-05 MED ORDER — FOLIC ACID 1 MG PO TABS
1.0000 mg | ORAL_TABLET | Freq: Every day | ORAL | Status: DC
  Administered 2021-12-05 – 2021-12-07 (×3): 1 mg via ORAL
  Filled 2021-12-05 (×3): qty 1

## 2021-12-05 MED ORDER — METOPROLOL TARTRATE 25 MG PO TABS
25.0000 mg | ORAL_TABLET | Freq: Two times a day (BID) | ORAL | Status: DC
  Administered 2021-12-05 – 2021-12-07 (×4): 25 mg via ORAL
  Filled 2021-12-05 (×4): qty 1

## 2021-12-05 MED ORDER — THIAMINE HCL 100 MG/ML IJ SOLN: 100.0000 mg | Freq: Every day | INTRAMUSCULAR | Status: DC

## 2021-12-05 MED ORDER — LORAZEPAM 2 MG/ML IJ SOLN: 0.0000 mg | Freq: Two times a day (BID) | INTRAMUSCULAR | Status: DC

## 2021-12-05 MED ORDER — LORAZEPAM 2 MG/ML IJ SOLN: 0.0000 mg | Freq: Four times a day (QID) | INTRAMUSCULAR | Status: AC

## 2021-12-05 MED ORDER — SERTRALINE HCL 50 MG PO TABS
25.0000 mg | ORAL_TABLET | Freq: Every day | ORAL | Status: DC
  Administered 2021-12-05 – 2021-12-07 (×3): 25 mg via ORAL
  Filled 2021-12-05 (×3): qty 1

## 2021-12-05 MED ORDER — AMIODARONE HCL 200 MG PO TABS
200.0000 mg | ORAL_TABLET | Freq: Every day | ORAL | Status: DC
  Administered 2021-12-05 – 2021-12-07 (×3): 200 mg via ORAL
  Filled 2021-12-05 (×3): qty 1

## 2021-12-05 MED ORDER — ADULT MULTIVITAMIN W/MINERALS CH
1.0000 | ORAL_TABLET | Freq: Every day | ORAL | Status: DC
  Administered 2021-12-05 – 2021-12-07 (×3): 1 via ORAL
  Filled 2021-12-05 (×3): qty 1

## 2021-12-05 MED ORDER — ALPRAZOLAM 0.5 MG PO TABS: 0.5000 mg | ORAL_TABLET | Freq: Three times a day (TID) | ORAL | Status: DC | PRN

## 2021-12-05 MED ORDER — DIPHENHYDRAMINE HCL 25 MG PO CAPS: 25.0000 mg | ORAL_CAPSULE | Freq: Four times a day (QID) | ORAL | Status: DC | PRN

## 2021-12-05 MED ORDER — FUROSEMIDE 10 MG/ML IJ SOLN
40.0000 mg | Freq: Two times a day (BID) | INTRAMUSCULAR | Status: AC
  Administered 2021-12-05 – 2021-12-07 (×4): 40 mg via INTRAVENOUS
  Filled 2021-12-05 (×4): qty 4

## 2021-12-05 MED ORDER — THIAMINE HCL 100 MG PO TABS
100.0000 mg | ORAL_TABLET | Freq: Every day | ORAL | Status: DC
  Administered 2021-12-05 – 2021-12-07 (×3): 100 mg via ORAL
  Filled 2021-12-05 (×3): qty 1

## 2021-12-05 MED ORDER — ASPIRIN 81 MG PO CHEW
324.0000 mg | CHEWABLE_TABLET | Freq: Once | ORAL | Status: AC
  Administered 2021-12-05: 324 mg via ORAL
  Filled 2021-12-05: qty 4

## 2021-12-05 MED ORDER — ALBUTEROL SULFATE HFA 108 (90 BASE) MCG/ACT IN AERS: 2.0000 | INHALATION_SPRAY | RESPIRATORY_TRACT | Status: DC | PRN

## 2021-12-05 MED ORDER — MORPHINE SULFATE (PF) 2 MG/ML IV SOLN: 0.5000 mg | INTRAVENOUS | Status: DC | PRN

## 2021-12-05 MED ORDER — NITROGLYCERIN 0.4 MG SL SUBL: 0.4000 mg | SUBLINGUAL_TABLET | SUBLINGUAL | Status: DC | PRN

## 2021-12-05 MED ORDER — FUROSEMIDE 10 MG/ML IJ SOLN: 20.0000 mg | Freq: Two times a day (BID) | INTRAMUSCULAR | Status: DC

## 2021-12-05 NOTE — ED Provider Notes (Signed)
[facility] Provider Note    Event Date/Time   First MD Initiated Contact with Patient 12/05/21 [phone]     (approximate)   History   Chest Pain   HPI  [facility] Collin Bonito. is a 76 y.o. male with a history of CHF with a EF of 25 to 30%, A-fib on Coumadin, sick sinus syndrome status post pacemaker, alcohol abuse who presents for evaluation of chest pain.  Patient reports that he has been having progressively worsening shortness of breath since being discharged from the hospital 10 days ago after being admitted for A-fib and found to have new systolic CHF.  Patient is not on any diuretics at home.  Has had some orthopnea and dyspnea with exertion.  Does not use oxygen at home.  This evening he reports that he walked from his car to the mailbox and back to the house where he felt very short of breath and started having left-sided chest pain that he describes as pressure.  The pain was intermittent for several hours and he was unable to sleep which prompted him to call 911.  He has no pain upon arrival to the emergency room.  He denies cough or congestion, fever or chills peer endorses compliance with his Coumadin.  Denies any prior history of PE or DVT, leg pain or swelling, hemoptysis.     Past Medical History:  Diagnosis Date   Anxiety     Past Surgical History:  Procedure Laterality Date   COLONOSCOPY WITH PROPOFOL N/A 09/25/2021   Procedure: COLONOSCOPY WITH PROPOFOL;  Surgeon: [name], DO;  Location: [facility] ENDOSCOPY;  Service: Gastroenterology;  Laterality: N/A;   EYE SURGERY     cataract both eyes   PACEMAKER LEADLESS INSERTION N/A 09/30/2020   Procedure: PACEMAKER LEADLESS INSERTION;  Surgeon: Isaias Cowman, MD;  Location: [facility];  Service: Cardiovascular;  Laterality: N/A;     Physical Exam   Triage Vital Signs: ED Triage Vitals  Enc Vitals Group     BP 12/05/21 0217 (!) 127/94     Pulse Rate 12/05/21 0217 60      Resp 12/05/21 0217 (!) 21     Temp 12/05/21 0217 (!) 97.4 F (36.3 C)     Temp Source 12/05/21 0217 Oral     SpO2 12/05/21 0217 97 %     Weight 12/05/21 0225 155 lb 10.3 oz (70.6 kg)     Height 12/05/21 0225 5' 10.5" (1.791 m)     Head Circumference --      Peak Flow --      Pain Score --      Pain Loc --      Pain Edu? --      Excl. in [place]? --     Most recent vital signs: Vitals:   12/05/21 0430 12/05/21 0500  BP: 112/90 (!) 125/95  Pulse: (!) 59 63  Resp: 19 (!) 22  Temp:    SpO2: 99% 99%     Constitutional: Alert and oriented. Well appearing and in no apparent distress. HEENT:      Head: Normocephalic and atraumatic.         Eyes: Conjunctivae are normal. Sclera is non-icteric.       Mouth/Throat: Mucous membranes are moist.       Neck: Supple with no signs of meningismus. Cardiovascular: Regular rate and rhythm. No murmurs, gallops, or rubs. 2+ symmetrical distal pulses are present in all extremities.  Respiratory: Normal respiratory  effort. Lungs are clear to auscultation bilaterally.  Gastrointestinal: Soft, non tender, and non distended with positive bowel sounds. No rebound or guarding. Genitourinary: No CVA tenderness. Musculoskeletal:  Trace b/l pedal edema Neurologic: Normal speech and language. Face is symmetric. Moving all extremities. No gross focal neurologic deficits are appreciated. Skin: Skin is warm, dry and intact. No rash noted. Psychiatric: Mood and affect are normal. Speech and behavior are normal.  ED Results / Procedures / Treatments   Labs (all labs ordered are listed, but only abnormal results are displayed) Labs Reviewed  PROTIME-INR - Abnormal; Notable for the following components:      Result Value   Prothrombin Time 35.3 (*)    INR 3.5 (*)    All other components within normal limits  CBC WITH DIFFERENTIAL/PLATELET - Abnormal; Notable for the following components:   RBC 4.07 (*)    MCV 106.4 (*)    MCH 35.4 (*)    All other  components within normal limits  BASIC METABOLIC PANEL - Abnormal; Notable for the following components:   CO2 19 (*)    Glucose, Bld 118 (*)    BUN 28 (*)    Calcium 8.7 (*)    All other components within normal limits  BRAIN NATRIURETIC PEPTIDE - Abnormal; Notable for the following components:   B Natriuretic Peptide 1,766.5 (*)    All other components within normal limits  TROPONIN I (HIGH SENSITIVITY) - Abnormal; Notable for the following components:   Troponin I (High Sensitivity) 37 (*)    All other components within normal limits  TROPONIN I (HIGH SENSITIVITY)     EKG  ED ECG REPORT I, Rudene Re, the attending physician, personally viewed and interpreted this ECG.  Sinus rhythm with PACs, rate of 61, prolonged QTc, no ST elevations or depressions.   RADIOLOGY I, Rudene Re, attending MD, have personally viewed and interpreted the images obtained during this visit as below:  Chest x-ray concerning for CHF   ___________________________________________________ Interpretation by Radiologist:  DG Chest Port 1 View  Result Date: 12/05/2021 CLINICAL DATA:  Shortness of breath and left-sided chest pain EXAM: PORTABLE CHEST 1 VIEW COMPARISON:  11/21/2021 FINDINGS: Small pleural effusions with bibasilar opacities, right greater than left. Mild cardiomegaly. IMPRESSION: Small bilateral pleural effusions and basilar opacities, likely atelectasis and/or pulmonary edema. Cardiomegaly. Electronically Signed   By: Ulyses Jarred M.D.   On: 12/05/2021 02:35      PROCEDURES:  Critical Care performed: Yes, see critical care procedure note(s)  .Critical Care Performed by: Rudene Re, MD Authorized by: Rudene Re, MD   Critical care provider statement:    Critical care time (minutes):  30   Critical care time was exclusive of:  Separately billable procedures and treating other patients   Critical care was necessary to treat or prevent imminent or  life-threatening deterioration of the following conditions:  Cardiac failure, respiratory failure and circulatory failure   Critical care was time spent personally by me on the following activities:  Development of treatment plan with patient or surrogate, discussions with consultants, evaluation of patient's response to treatment, examination of patient, ordering and review of laboratory studies, ordering and review of radiographic studies, ordering and performing treatments and interventions, pulse oximetry, re-evaluation of patient's condition and review of old charts   I assumed direction of critical care for this patient from another provider in my specialty: no     Care discussed with: admitting provider      IMPRESSION / MDM /  ASSESSMENT AND PLAN / ED COURSE  I reviewed the triage vital signs and the nursing notes.  76 y.o. male with a history of CHF with a EF of 25 to 30%, A-fib on Coumadin, sick sinus syndrome status post pacemaker, alcohol abuse who presents for evaluation of chest pain that he describes as pressure, intermittently since 5PM this afternoon.  Patient has had 10 days of progressively worsening dyspnea with exertion and orthopnea.  Recent admission to the hospital with discharge 10 days ago for A-fib with RVR.  During that admission patient had an echocardiogram that showed new systolic dysfunction with a EF of 25 to 30%.  Patient was discharged home and not started on diuretic.  He has trace pitting edema and mild crackles bilaterally but no respiratory distress, respiratory rate in the low 20s with normal sats at rest  Ddx: CHF exacerbation versus ACS versus A-fib with RVR.  Less likely PE since patient is on Coumadin   Plan: EKG, troponin x2, INR, CBC, metabolic panel, BNP, chest x-ray.  Patient placed on telemetry for close monitoring of cardiorespiratory status.  We will give a full aspirin.  Will interrogate pacemaker.   MEDICATIONS GIVEN IN ED: Medications  aspirin  chewable tablet 324 mg (324 mg Oral Given 12/05/21 0228)  furosemide (LASIX) injection 20 mg (20 mg Intravenous Given 12/05/21 0334)     ED COURSE: Presentation is concerning with a CHF exacerbation with chest x-ray showing cardiomegaly, edema and bilateral effusions.  Patient's BNP is elevated at 1766.  Kidney function is stable, no significant electrolyte derangements.  INR slightly supratherapeutic at 3.5.  EKG showing prolonged QTc with no signs of ischemia.  First troponin within patient's baseline.  Patient given IV lasix. After patient diuresed we attempted to ambulate but he became extremely tachypneic and his sats dropped to the low 90s.  Therefore hospitalist service was consulted for admission for diuresis for CHF exacerbation.  Patient was accepted to their service after discussion   Consults: Hospitalist   EMR reviewed including records from patient's recent admission from 10 days ago for A-fib with RVR and new onset systolic CHF    FINAL CLINICAL IMPRESSION(S) / ED DIAGNOSES   Final diagnoses:  Chest pain  Acute on chronic systolic congestive heart failure ([place])     Rx / DC Orders   ED Discharge Orders     None        Note:  This document was prepared using Dragon voice recognition software and may include unintentional dictation errors.   Please note:  Patient was evaluated in Emergency Department today for the symptoms described in the history of present illness. Patient was evaluated in the context of the global COVID-19 pandemic, which necessitated consideration that the patient might be at risk for infection with the SARS-CoV-2 virus that causes COVID-19. Institutional protocols and algorithms that pertain to the evaluation of patients at risk for COVID-19 are in a state of rapid change based on information released by regulatory bodies including the CDC and federal and state organizations. These policies and algorithms were followed during the patient's care in  the ED.  Some ED evaluations and interventions may be delayed as a result of limited staffing during the pandemic.       Alfred Levins, [place], MD 12/05/21 [phone]

## 2021-12-05 NOTE — H&P (Signed)
History and Physical    [facility] Collin Knight DOB: March 25, 1946 DOA: 12/05/2021  Referring MD/NP/PA:   PCP: Collin Pink, MD   Patient coming from:  The patient is coming from home.  At baseline, pt is independent for most of ADL.        Chief Complaint: SOB and chest pain  HPI: [facility] Collin Bonito. is a 76 y.o. male with medical history significant of sCHF with EF 20-25%, A fib on Couamdin, lymphocytic colitis, SSS (s/p of pacemaker), alcohol abuse, depression with anxiety, who presents with chest pain and SOB.  Patient was recently hospitalized from 1/31 - 2/4, and found to have new onset atrial fibrillation and new onset sCHF with EF of 20-25%.  Patient was discharged on metoprolol, amiodarone and Coumadin for atrial fibrillation.  Since patient was euvolemic at discharge, no diuretics was given.  Patient states that he developed shortness of breath for more than a week, which has been progressively worsening. He also reports chest pain, which is located in the left side of the chest, mild, pressure-like, nonradiating.  Patient does not have cough, fever or chills.  No nausea, vomiting, diarrhea or abdominal pain.  No symptoms of UTI. Has had some orthopnea and dyspnea with exertion.   Data Reviewed and ED Course: pt was found to have trop 37 --> 35, INR 3.5, pending COVID PCR, BNP 1766, GFR > 60, blood pressure 111/82, heart rate 63, 58, RR 23, oxygen saturation 96-100% on 2 L oxygen. Pt is admitted to telemetry bed as inpatient.  Dr. Nehemiah Massed of cardiology is consulted  Chest x-ray: Small bilateral pleural effusions and basilar opacities, likely atelectasis and/or pulmonary edema. Cardiomegaly   EKG: I have personally reviewed.  Sinus rhythm, QTc 505, left axis deviation, poor R wave progression, T wave inversion in lateral leads and V4-V6, PAC.   Review of Systems:   General: no fevers, chills, no body weight gain, has fatigue HEENT: no blurry vision, hearing  changes or sore throat Respiratory: has dyspnea, no coughing, wheezing CV: has chest pain, no palpitations GI: no nausea, vomiting, abdominal pain, diarrhea, constipation GU: no dysuria, burning on urination, increased urinary frequency, hematuria  Ext: has leg edema Neuro: no unilateral weakness, numbness, or tingling, no vision change or hearing loss Skin: no rash, no skin tear. MSK: No muscle spasm, no deformity, no limitation of range of movement in spin Heme: No easy bruising.  Travel history: No recent long distant travel.   Allergy: No Known Allergies  Past Medical History:  Diagnosis Date   Anxiety    Chronic atrial fibrillation (HCC)    Chronic systolic CHF (congestive heart failure) (HCC)    Pacemaker    SSS (sick sinus syndrome) ([place])     Past Surgical History:  Procedure Laterality Date   COLONOSCOPY WITH PROPOFOL N/A 09/25/2021   Procedure: COLONOSCOPY WITH PROPOFOL;  Surgeon: [name], DO;  Location: [facility] ENDOSCOPY;  Service: Gastroenterology;  Laterality: N/A;   EYE SURGERY     cataract both eyes   PACEMAKER LEADLESS INSERTION N/A 09/30/2020   Procedure: PACEMAKER LEADLESS INSERTION;  Surgeon: Isaias Cowman, MD;  Location: [facility];  Service: Cardiovascular;  Laterality: N/A;    Social History:  reports that he has never smoked. He has never used smokeless tobacco. He reports current alcohol use of about 21.0 standard drinks per week. He reports that he does not use drugs.  Family History:  Family History  Problem Relation Age of Onset  Stroke Mother    Cancer - Lung Father      Prior to Admission medications   Medication Sig Start Date End Date Taking? Authorizing Provider  ALPRAZolam Duanne Moron) 0.5 MG tablet Take 1 tablet (0.5 mg total) by mouth 3 (three) times daily. 11/25/21  Yes Manuella Ghazi, Pratik D, DO  amiodarone (PACERONE) 200 MG tablet Take 1 tablet (200 mg total) by mouth daily. 11/29/21 12/29/21 Yes Shah, Pratik D, DO   budesonide (ENTOCORT EC) 3 MG 24 hr capsule Take 6-9 mg by mouth daily. 10/30/21 10/30/22 Yes [provider]  JANTOVEN 5 MG tablet Take 5 mg by mouth daily. 11/08/21  Yes [provider]  metoprolol tartrate (LOPRESSOR) 25 MG tablet Take 1 tablet (25 mg total) by mouth 2 (two) times daily. 11/25/21 12/25/21 Yes Shah, Pratik D, DO  Multiple Vitamin (MULTIVITAMIN WITH MINERALS) TABS tablet Take 1 tablet by mouth daily. 11/26/21 12/26/21 Yes Shah, Pratik D, DO  sertraline (ZOLOFT) 25 MG tablet Take 25 mg by mouth daily. 09/06/20  Yes [provider]  thiamine 100 MG tablet Take 1 tablet (100 mg total) by mouth daily. 11/26/21 12/26/21 Yes Manuella Ghazi, Pratik D, DO    Physical Exam: Vitals:   12/05/21 0600 12/05/21 0713 12/05/21 0814 12/05/21 1206  BP: 111/82 132/62 132/86 (!) 122/92  Pulse: (!) 58 60 63 (!) 58  Resp: 16 16 18 18   Temp:  (!) 97.3 F (36.3 C) 98.2 F (36.8 C) 97.7 F (36.5 C)  TempSrc:  Oral    SpO2: 100% 100% 94% 95%  Weight:      Height:       General: Not in acute distress HEENT:       Eyes: PERRL, EOMI, no scleral icterus.       ENT: No discharge from the ears and nose, no pharynx injection, no tonsillar enlargement.        Neck: positive JVD, no bruit, no mass felt. Heme: No neck lymph node enlargement. Cardiac: S1/S2, RRR, No murmurs, No gallops or rubs. Respiratory: has crackles at the bilateral bases GI: Soft, nondistended, nontender, no rebound pain, no organomegaly, BS present. GU: No hematuria Ext:  trace pitting leg edema bilaterally. 1+DP/PT pulse bilaterally. Musculoskeletal: No joint deformities, No joint redness or warmth, no limitation of ROM in spin. Skin: No rashes.  Neuro: Alert, oriented X3, cranial nerves II-XII grossly intact, moves all extremities normally.  Psych: Patient is not psychotic, no suicidal or hemocidal ideation.  Labs on Admission: I have personally reviewed following labs and imaging studies  CBC: Recent Labs  Lab  12/05/21 0226  WBC 7.7  NEUTROABS 4.9  HGB 14.4  HCT 43.3  MCV 106.4*  PLT 527   Basic Metabolic Panel: Recent Labs  Lab 12/05/21 0226 12/05/21 0500  NA 137  --   K 3.9  --   CL 110  --   CO2 19*  --   GLUCOSE 118*  --   BUN 28*  --   CREATININE 1.11  --   CALCIUM 8.7*  --   MG  --  2.0   GFR: Estimated Creatinine Clearance: 54.8 mL/min (by C-G formula based on SCr of 1.11 mg/dL). Liver Function Tests: No results for input(s): AST, ALT, ALKPHOS, BILITOT, PROT, ALBUMIN in the last 168 hours. No results for input(s): LIPASE, AMYLASE in the last 168 hours. No results for input(s): AMMONIA in the last 168 hours. Coagulation Profile: Recent Labs  Lab 12/05/21 0226  INR 3.5*   Cardiac Enzymes:  No results for input(s): CKTOTAL, CKMB, CKMBINDEX, TROPONINI in the last 168 hours. BNP (last 3 results) No results for input(s): PROBNP in the last 8760 hours. HbA1C: Recent Labs    12/05/21 0818  HGBA1C 5.7*   CBG: No results for input(s): GLUCAP in the last 168 hours. Lipid Profile: No results for input(s): CHOL, HDL, LDLCALC, TRIG, CHOLHDL, LDLDIRECT in the last 72 hours. Thyroid Function Tests: No results for input(s): TSH, T4TOTAL, FREET4, T3FREE, THYROIDAB in the last 72 hours. Anemia Panel: No results for input(s): VITAMINB12, FOLATE, FERRITIN, TIBC, IRON, RETICCTPCT in the last 72 hours. Urine analysis: No results found for: COLORURINE, APPEARANCEUR, LABSPEC, PHURINE, GLUCOSEU, HGBUR, BILIRUBINUR, KETONESUR, PROTEINUR, UROBILINOGEN, NITRITE, LEUKOCYTESUR Sepsis Labs: @LABRCNTIP (procalcitonin:4,lacticidven:4) )No results found for this or any previous visit (from the past 240 hour(s)).   Radiological Exams on Admission: DG Chest Port 1 View  Result Date: 12/05/2021 CLINICAL DATA:  Shortness of breath and left-sided chest pain EXAM: PORTABLE CHEST 1 VIEW COMPARISON:  11/21/2021 FINDINGS: Small pleural effusions with bibasilar opacities, right greater than left.  Mild cardiomegaly. IMPRESSION: Small bilateral pleural effusions and basilar opacities, likely atelectasis and/or pulmonary edema. Cardiomegaly. Electronically Signed   By: Ulyses Jarred M.D.   On: 12/05/2021 02:35      Assessment/Plan Principal Problem:   Acute on chronic systolic CHF (congestive heart failure) (HCC) Active Problems:   Chest pain   Elevated troponin   Atrial fibrillation, chronic (HCC)   Alcohol use disorder, mild, abuse   Depression with anxiety   Lymphocytic colitis  Acute on chronic systolic CHF (congestive heart failure):  2D echo on 11/22/2021 showed EF of 20-25%.  Patient has leg edema, post JVD, crackles on auscultation, elevated BNP 1766.  Clinically consistent with CHF exacerbation.  Since patient has chest pain and elevated troponin.  Dr. Nehemiah Massed of cardiology is consulted.  -Will admit to tele unit as inpatient -Lasix 20 mg bid by IV -Daily weights -strict I/O's -Low salt diet -Fluid restriction -Obtain REDs Vest reading  Chest pain and elevated troponin: Troponin 37 --> 35.  Possibly due to demand ischemia. -Trend troponin -Aspirin, -As needed nitroglycerin, morphine -Check A1c, FLP  Atrial fibrillation, chronic ([place]): Patient is on Coumadin, INR 3.5 -Coumadin dose adjustment per pharmacist -Continue metoprolol, amiodarone  Lymphocytic colitis: No diarrhea or abdominal pain. -Continue home budesonide  Anxiety anxiety -Continue home as needed Xanax, Zoloft  Alcohol use disorder, mild, abuse -Due to counseling about importance of quitting alcohol use -CIWA protocol      DVT ppx: on Coumadin  Code Status: Partial code (I discussed with the patient in the presence of wife, and explained the meaning of CODE STATUS, patient wants to be partial code, OK for CPR, but no intubation).  Family Communication:   Yes, patient's  wife at bedside  Disposition Plan:  Anticipate discharge back to previous environment  Consults called: Dr. Nehemiah Massed  of cardiology  Admission status and Level of care: Telemetry Cardiac:   as inpt        Severity of Illness:  The appropriate patient status for this patient is INPATIENT. Inpatient status is judged to be reasonable and necessary in order to provide the required intensity of service to ensure the patient's safety. The patient's presenting symptoms, physical exam findings, and initial radiographic and laboratory data in the context of their chronic comorbidities is felt to place them at high risk for further clinical deterioration. Furthermore, it is not anticipated that the patient will be medically stable for discharge from  the hospital within 2 midnights of admission.   * I certify that at the point of admission it is my clinical judgment that the patient will require inpatient hospital care spanning beyond 2 midnights from the point of admission due to high intensity of service, high risk for further deterioration and high frequency of surveillance required.*       Date of Service 12/05/2021    Ivor Costa [facility]   If 7PM-7AM, please contact night-coverage [email or web address] 12/05/2021, 1:37 PM

## 2021-12-05 NOTE — TOC Initial Note (Signed)
Transition of Care (TOC) - Initial/Assessment Note    Patient Details  Name: Collin Knight. MRN: [id number] Date of Birth: [birth year]  Transition of Care Collin Knight) CM/SW Contact:    Collin Salen, RN Phone Number: 12/05/2021, 1:45 PM  Clinical Narrative:  Spoke with patient and wife Collin Knight at bedside.Patient voices using Cane at times, denies use of any other medical equipment. [facility] on [address] Patient and wife both drive, wife grocery shops and patient cooks. Both agree to HHPT/OT and SNF if recommended by Attending. No identified TOC needs at this time.                 Expected Discharge Plan: Home/Self Care Barriers to Discharge: Continued Medical Work up   Patient Goals and CMS Choice Patient states their goals for this hospitalization and ongoing recovery are:: To return home with wife.      Expected Discharge Plan and Services Expected Discharge Plan: Home/Self Care       Living arrangements for the past 2 months: Apartment                                      Prior Living Arrangements/Services Living arrangements for the past 2 months: Apartment Lives with:: Spouse Patient language and need for interpreter reviewed:: Yes Do you feel safe going back to the place where you live?: Yes      Need for Family Participation in Patient Care: Yes (Comment) (Patient is [facility]) Care giver support system in place?: Yes (comment) (wife Collin Knight)   Criminal Activity/Legal Involvement Pertinent to Current Situation/Hospitalization: No - Comment as needed  Activities of Daily Living Home Assistive Devices/Equipment: None ADL Screening (condition at time of admission) Patient's cognitive ability adequate to safely complete daily activities?: Yes Is the patient deaf or have difficulty hearing?: Yes Does the patient have difficulty seeing, even when wearing glasses/contacts?: No Does the patient have difficulty concentrating, remembering, or  making decisions?: No Patient able to express need for assistance with ADLs?: Yes Does the patient have difficulty dressing or bathing?: No Independently performs ADLs?: Yes (appropriate for developmental age) Does the patient have difficulty walking or climbing stairs?: No Weakness of Legs: None Weakness of Arms/Hands: None  Permission Sought/Granted Permission sought to share information with : Case Manager                Emotional Assessment Appearance:: Appears stated age Attitude/Demeanor/Rapport: Engaged Affect (typically observed): Accepting Orientation: : Oriented to Self, Oriented to Place, Oriented to  Time, Oriented to Situation Alcohol / Substance Use: Not Applicable Psych Involvement: No (comment)  Admission diagnosis:  Acute CHF (congestive heart failure) (HCC) [I50.9] Acute on chronic systolic congestive heart failure (HCC) [I50.23] Chest pain [R07.9] Acute on chronic systolic CHF (congestive heart failure) (HCC) [I50.23] Patient Active Problem List   Diagnosis Date Noted   Depression with anxiety 12/05/2021   Atrial fibrillation, chronic ([place]) 12/05/2021   Chest pain 12/05/2021   Acute on chronic systolic CHF (congestive heart failure) ([place]) 12/05/2021   Elevated troponin 12/05/2021   Lymphocytic colitis 12/05/2021   Atrial fibrillation with RVR ([place]) 11/21/2021   Shortness of breath 11/21/2021   On warfarin therapy 11/21/2021   Rapid atrial fibrillation ([place]) 09/29/2020   Recurrent syncope 09/29/2020   Anxiety 09/29/2020   Alcohol use disorder, mild, abuse 09/29/2020   Elevated lactic acid level 09/29/2020   PCP:  [name], MD [id number] [place], [place] [address] [place] [address] Phone: [phone] Fax: [phone]     Social Determinants of Health (SDOH) Interventions    Readmission Risk Interventions No flowsheet data found.

## 2021-12-05 NOTE — Consult Note (Addendum)
[place] CARDIOLOGY CONSULT NOTE       Patient ID: [facility]. MRN: [id number] DOB/AGE: May 13, 1946 76 y.o.  Admit date: 12/05/2021 Referring Physician D.r Ivor Costa Primary Physician  Primary Cardiologist Dr. 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  He was found to be in A. fib with RVR and converted to NSR with amiodarone gtt.  Echocardiogram during that admission showed newly reduced EF 20-25% and was previously greater than 55% 1 year ago. He was discharged on 2/4 with plans for outpatient cardiac work up with his regular cardiologist.   He presented to [facility] ED on 2/14 with left sided chest pain and worsening SOB over the 10 days since he was discharged. Yesterday, he was so short of breath he could barely talk or walk around at all. Admits to generalized weakness, orthopnea and some lower leg swelling. He was given 20mg  IV lasix in the ED with good reported output and stays he feels much better this afternoon after getting some fluid off this morning. Denies further chest pain, palpitations, dizziness.   Labs on admissions are notable for a BNP of 1766. Troponin flat 37-35-34-32. Creatinine 1.11 and Gfr >60. INR 3.5.  Vitals are significant for blood pressure peaking at 127/94, SPO2 95% on room air, heart rate in the high 50s-low 60s throughout the morning.  Chest x-ray revealed small bilateral pleural effusions and basilar opacities likely atelectasis and/or pulmonary edema.  Cardiomegaly  Review of systems complete and found to be negative unless listed above   Past Medical History:  Diagnosis Date   Anxiety     Chronic atrial fibrillation (HCC)    Chronic systolic CHF (congestive heart failure) (HCC)    Pacemaker    SSS (sick sinus syndrome) ([place])     Past Surgical History:  Procedure Laterality Date   COLONOSCOPY WITH PROPOFOL N/A 09/25/2021   Procedure: COLONOSCOPY WITH PROPOFOL;  Surgeon: [name], DO;  Location: [facility];  Service: Gastroenterology;  Laterality: N/A;   EYE SURGERY     cataract both eyes   PACEMAKER LEADLESS INSERTION N/A 09/30/2020   Procedure: PACEMAKER LEADLESS INSERTION;  Surgeon: Isaias Cowman, MD;  Location: [place] CV LAB;  Service: Cardiovascular;  Laterality: N/A;    Medications Prior to Admission  Medication Sig Dispense Refill Last Dose   ALPRAZolam (XANAX) 0.5 MG tablet Take 1 tablet (0.5 mg total) by mouth 3 (three) times daily. 20 tablet 0 12/04/2021   amiodarone (PACERONE) 200 MG tablet Take 1 tablet (200 mg total) by mouth daily. 30 tablet 1 12/04/2021   budesonide (ENTOCORT EC) 3 MG 24 hr capsule Take 6-9 mg by mouth daily.   12/04/2021   JANTOVEN 5 MG tablet Take 5 mg by mouth daily.   12/04/2021   metoprolol tartrate (LOPRESSOR) 25 MG tablet Take 1 tablet (25 mg total) by mouth 2 (two) times daily. 60 tablet 2 12/04/2021   Multiple Vitamin (MULTIVITAMIN WITH MINERALS) TABS tablet Take 1 tablet by mouth daily. 30 tablet 0 12/04/2021   sertraline (ZOLOFT) 25 MG tablet Take 25 mg by mouth daily.   12/04/2021   thiamine 100 MG tablet Take 1 tablet (100 mg total) by mouth daily. 30 tablet 0 12/04/2021  Social History   Socioeconomic History   Marital status: Married    Spouse name: Not on file   Number of children: Not on file   Years of education: Not on file   Highest education level: Not on file  Occupational History   Not on file  Tobacco Use   Smoking status: Never   Smokeless tobacco: Never  Vaping Use   Vaping Use: Never used  Substance and Sexual Activity   Alcohol use: Yes    Alcohol/week: 21.0 standard drinks     Types: 21 Cans of beer per week    Comment: 3 beers a day   Drug use: Never   Sexual activity: Yes  Other Topics Concern   Not on file  Social History Narrative   Not on file   Social Determinants of Health   Financial Resource Strain: Not on file  Food Insecurity: Not on file  Transportation Needs: Not on file  Physical Activity: Not on file  Stress: Not on file  Social Connections: Not on file  Intimate Partner Violence: Not on file    History reviewed. No pertinent family history.    Review of systems complete and found to be negative unless listed above    PHYSICAL EXAM General: Elderly appearing caucasian male, in no acute distress. Sitting upright in PCU bed.  Wife at bedside. HEENT:  Normocephalic and atraumatic. Hearing aid present in right ear. Neck:   No JVD.  Lungs: Normal respiratory effort on room air.  Bibasilar crackles Heart: HRRR with frequent extra beats. Normal S1 and S2 without gallops or murmurs. Radial & DP pulses 2+ bilaterally. Abdomen: Non-distended appearing.  Msk: Normal strength and tone for age. Extremities: Warm and well perfused. No clubbing, cyanosis.  Trace bilateral peripheral edema to the mid calf at the top of his socks. Neuro: Alert and oriented X 3. Psych:  Answers questions appropriately.   Labs:   Lab Results  Component Value Date   WBC 7.7 12/05/2021   HGB 14.4 12/05/2021   HCT 43.3 12/05/2021   MCV 106.4 (H) 12/05/2021   PLT 327 12/05/2021    Recent Labs  Lab 12/05/21 0226  NA 137  K 3.9  CL 110  CO2 19*  BUN 28*  CREATININE 1.11  CALCIUM 8.7*  GLUCOSE 118*   No results found for: CKTOTAL, CKMB, CKMBINDEX, TROPONINI No results found for: CHOL No results found for: HDL No results found for: LDLCALC No results found for: TRIG No results found for: CHOLHDL No results found for: LDLDIRECT    Radiology: DG Chest Port 1 View  Result Date: 12/05/2021 CLINICAL DATA:  Shortness of breath and left-sided chest pain  EXAM: PORTABLE CHEST 1 VIEW COMPARISON:  11/21/2021 FINDINGS: Small pleural effusions with bibasilar opacities, right greater than left. Mild cardiomegaly. IMPRESSION: Small bilateral pleural effusions and basilar opacities, likely atelectasis and/or pulmonary edema. Cardiomegaly. Electronically Signed   By: Ulyses Jarred M.D.   On: 12/05/2021 02:35   DG Chest Port 1 View  Result Date: 11/21/2021 CLINICAL DATA:  Chest pain, shortness of breath. EXAM: PORTABLE CHEST 1 VIEW COMPARISON:  September 29, 2020. FINDINGS: Stable cardiomediastinal silhouette. Mild bibasilar subsegmental atelectasis or edema is noted with probable small bilateral pleural effusions. The visualized skeletal structures are unremarkable. IMPRESSION: Mild bibasilar subsegmental atelectasis or edema is noted with probable small bilateral pleural effusions. Electronically Signed   By: Marijo Conception M.D.   On: 11/21/2021 15:32   ECHOCARDIOGRAM COMPLETE  Result Date: 11/22/2021  ECHOCARDIOGRAM REPORT   Patient Name:   Collin Knight Date of Exam: 11/22/2021 Medical Rec #:  [id number]  Height:       70.0 in Accession #:    [id number] Weight:       165.0 lb Date of Birth:  1946/08/01   BSA:          1.923 m Patient Age:    76 years   BP:           118/58 mmHg Patient Gender: M          HR:           108 bpm. Exam Location:  [facility] Procedure: 2D Echo, Color Doppler and Cardiac Doppler Indications:     R94.31 Abnormal ECG  History:         Patient has prior history of Echocardiogram examinations. CAD,                  Pacemaker, Signs/Symptoms:Shortness of Breath and Fatigue; Risk                  Factors:Hypertension.  Sonographer:     Charmayne Sheer Referring Phys:  [id number] [facility] Diagnosing Phys: Donnelly Angelica  Sonographer Comments: Suboptimal parasternal window. IMPRESSIONS  1. Left ventricular ejection fraction, by estimation, is 20 to 25%. The left ventricle has severely decreased function. The left ventricle demonstrates global hypokinesis. The left  ventricular internal cavity size was mildly dilated. Left ventricular diastolic parameters are indeterminate.  2. Right ventricular systolic function is mildly reduced. The right ventricular size is mildly enlarged.  3. Left atrial size was mildly dilated.  4. Right atrial size was moderately dilated.  5. The mitral valve is normal in structure. No evidence of mitral valve regurgitation. No evidence of mitral stenosis.  6. The aortic valve is normal in structure. Aortic valve regurgitation is not visualized. No aortic stenosis is present.  7. The inferior vena cava is dilated in size with <50% respiratory variability, suggesting right atrial pressure of 15 mmHg. FINDINGS  Left Ventricle: Left ventricular ejection fraction, by estimation, is 20 to 25%. The left ventricle has severely decreased function. The left ventricle demonstrates global hypokinesis. The left ventricular internal cavity size was mildly dilated. There is no left ventricular hypertrophy. Left ventricular diastolic parameters are indeterminate. Right Ventricle: The right ventricular size is mildly enlarged. No increase in right ventricular wall thickness. Right ventricular systolic function is mildly reduced. Left Atrium: Left atrial size was mildly dilated. Right Atrium: Right atrial size was moderately dilated. Pericardium: There is no evidence of pericardial effusion. Mitral Valve: The mitral valve is normal in structure. No evidence of mitral valve regurgitation. No evidence of mitral valve stenosis. MV peak gradient, 5.1 mmHg. The mean mitral valve gradient is 2.0 mmHg. Tricuspid Valve: The tricuspid valve is normal in structure. Tricuspid valve regurgitation is trivial. Aortic Valve: The aortic valve is normal in structure. Aortic valve regurgitation is not visualized. No aortic stenosis is present. Aortic valve mean gradient measures 2.0 mmHg. Aortic valve peak gradient measures 3.6 mmHg. Aortic valve area, by VTI measures 2.82 cm. Pulmonic  Valve: The pulmonic valve was not well visualized. Aorta: The aortic root is normal in size and structure. Venous: The inferior vena cava is dilated in size with less than 50% respiratory variability, suggesting right atrial pressure of 15 mmHg. IAS/Shunts: The interatrial septum was not well visualized.  LEFT VENTRICLE PLAX 2D LVIDd:         5.63 cm  Diastology LVIDs:         4.33 cm   LV e' lateral:   5.22 cm/s LV PW:         1.15 cm   LV E/e' lateral: 18.0 LV IVS:        0.80 cm LVOT diam:     2.40 cm LV SV:         36 LV SV Index:   19 LVOT Area:     4.52 cm  RIGHT VENTRICLE RV Basal diam:  4.35 cm LEFT ATRIUM             Index        RIGHT ATRIUM           Index LA diam:        4.10 cm 2.13 cm/m   RA Area:     26.30 cm LA Vol (A2C):   60.5 ml 31.45 ml/m  RA Volume:   94.70 ml  49.23 ml/m LA Vol (A4C):   59.6 ml 30.99 ml/m LA Biplane Vol: 60.3 ml 31.35 ml/m  AORTIC VALVE                    PULMONIC VALVE AV Area (Vmax):    2.59 cm     PV Vmax:       0.59 m/s AV Area (Vmean):   2.70 cm     PV Vmean:      38.100 cm/s AV Area (VTI):     2.82 cm     PV VTI:        0.090 m AV Vmax:           94.40 cm/s   PV Peak grad:  1.4 mmHg AV Vmean:          62.400 cm/s  PV Mean grad:  1.0 mmHg AV VTI:            0.129 m AV Peak Grad:      3.6 mmHg AV Mean Grad:      2.0 mmHg LVOT Vmax:         54.10 cm/s LVOT Vmean:        37.200 cm/s LVOT VTI:          0.080 m LVOT/AV VTI ratio: 0.62  AORTA Ao Root diam: 3.50 cm MITRAL VALVE MV Area (PHT): 5.13 cm    SHUNTS MV Area VTI:   1.49 cm    Systemic VTI:  0.08 m MV Peak grad:  5.1 mmHg    Systemic Diam: 2.40 cm MV Mean grad:  2.0 mmHg MV Vmax:       1.13 m/s MV Vmean:      68.2 cm/s MV Decel Time: 148 msec MV E velocity: 94.05 cm/s Donnelly Angelica Electronically signed by Donnelly Angelica Signature Date/Time: 11/22/2021/1:01:37 PM    Final     ECHO LVEF 20-25%  TELEMETRY reviewed by me: Sinus rhythm, rate 64, PVCs.  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  He was found to be in A. fib with RVR and converted to NSR with amiodarone gtt.  Echocardiogram during  that admission showed newly reduced EF 20-25% and was previously greater than 55% 1 year ago. He was discharged on 2/4 with plans for outpatient cardiac work up with his regular cardiologist.  He came back to [facility] ED 12/05/2021 with worsening shortness of breath since he was discharged.  #Acute on chronic HFrEF (11/22/21 LVEF 20-25%) Patient presented 10 days after discharge with progressively worsening shortness of breath with exertion.  He was euvolemic at discharge and not sent home with a diuretic.  He reports worsening shortness of breath to the point that he is unable to speak or lay flat.  Chest x-ray revealed small bilateral pleural effusions, BNP elevated to 1766.  Troponin flat. -S/p 20 mg of IV Lasix on admission with good output per patient. -Continue 40 mg IV Lasix twice daily -Continue metoprolol tartrate 25 mg twice daily -Appreciate heart failure nurse providing education and resources -Recommend strict I's and O's, daily weights, fluid and salt restriction -The patient will need close follow-up at discharge for consideration of further cardiac work-up.  He has an appointment with [facility] cardiology on 12/12/2021.  #Paroxysmal atrial fibrillation with RVR on warfarin  #history of sinus pauses s/p micra pacemaker placement 09/2020 Currently in normal sinus rhythm with periods of heart rate in the high 50s. -Continue warfarin, appreciate pharmacy assistance with  dosing. -INR 3.5, daily INR checks -Continue amiodarone 200 mg once daily for rhythm control -Continue metoprolol as above  #alcohol abuse #anxiety -Reportedly drinks 3-4 beers per day.  Strongly recommended cessation from alcohol.  This patient's plan of care was discussed and created with Dr. Nehemiah Massed and he is in agreement.  Signed: Tristan Schroeder , PA-C 12/05/2021, 10:43 AM [facility] Cardiology  The patient has been interviewed and examined. I agree with assessment and plan above. Serafina Royals MD [facility]

## 2021-12-05 NOTE — ED Triage Notes (Signed)
Pt to ED via [facility] from home with c/o SOB and left sided chest pain that began on 2/13 when pt was ambulating from house to car. Pt reports having [employer] pacemaker and describes pain over pacemaker area. Pt refused Aspirin in route, offered by EMS. Pt arrived to ED A&O x4.

## 2021-12-05 NOTE — Progress Notes (Signed)
Collin Knight for warfarin Indication: atrial fibrillation  No Known Allergies  Patient Measurements: Height: 5' 10.5" (179.1 cm) Weight: 70.6 kg (155 lb 10.3 oz) IBW/kg (Calculated) : 74.15  Vital Signs: Temp: 97.3 F (36.3 C) (02/14 0713) Temp Source: Oral (02/14 0713) BP: 132/62 (02/14 0713) Pulse Rate: 60 (02/14 0713)  Labs: Recent Labs    12/05/21 0226 12/05/21 0500  HGB 14.4  --   HCT 43.3  --   PLT 327  --   LABPROT 35.3*  --   INR 3.5*  --   CREATININE 1.11  --   TROPONINIHS 37* 35*    Estimated Creatinine Clearance: 54.8 mL/min (by C-G formula based on SCr of 1.11 mg/dL).   Medical History: Past Medical History:  Diagnosis Date   Anxiety    Chronic atrial fibrillation (HCC)    Chronic systolic CHF (congestive heart failure) (HCC)    Pacemaker    SSS (sick sinus syndrome) (HCC)     Medications:  (Not in a hospital admission)  Scheduled:   folic acid  1 mg Oral Daily   furosemide  20 mg Intravenous Q12H   LORazepam  0-4 mg Intravenous Q6H   Followed by   Derrill Memo ON 12/07/2021] LORazepam  0-4 mg Intravenous Q12H   multivitamin with minerals  1 tablet Oral Daily   thiamine  100 mg Oral Daily   Or   thiamine  100 mg Intravenous Daily   Infusions:  PRN: acetaminophen, albuterol, dextromethorphan-guaiFENesin, diphenhydrAMINE, LORazepam **OR** LORazepam, morphine injection, nitroGLYCERIN Anti-infectives (From admission, onward)    None       Assessment: 76YOM PMH A-fib on Coumadin, sick sinus syndrome status post pacemaker, CHF with a EF of 25 to 30%, alcohol abuse, HTN, CAD. CHADSVASC ~5. On warfarin PTA Warfarin 5 mg daily (last dose 2/13)  Recent admission 2/1-2/4. At that time, amiodarone started for atrial fibrillation. S/p loading dose, now on maintenance amiodarone 200 mg daily.   DDI: PTA amiodarone 200 mg daily   Goal of Therapy:  INR 2-3 Monitor platelets by anticoagulation protocol: Yes   Date  INR Warfarin Dose  2/14 3.5 Warfarin 2 mg  2/15      Plan: Give warfarin 2 mg x 1 INR supra-therapeutic after warfarin 5 mg per PTA. Will significantly reduce dose today.  Noted amiodarone as possible new (~2 weeks ago) drug interaction. May possibly need to decrease weekly dose by ~20%.  Daily INR, CBC   Collin Knight, PharmD Pharmacy Resident  12/05/2021 8:01 AM

## 2021-12-05 NOTE — Consult Note (Signed)
° °  Heart Failure Nurse Navigator Note  HFrEF 25-30%.  Left ventricle with global hypokinesis.  Left ventricular diastolic parameters are indeterminate.  Right ventricular systolic function is mildly reduced.  The right ventricle is mildly enlarged.  Left atrium is mildly dilated.  Right atrium is moderately dilated.  He presented from home with complaints of increasing shortness of breath, orthopnea, left-sided chest discomfort.  He has last been hospitalized January 31 and then discharged on November 25, 2021.   Comorbidities:  Atrial fibrillation on Coumadin Lymphocytic colitis Alcohol abuse Depression/anxiety  Insertion of Micra leadless pacemaker 09/2020  Medications:  Amiodarone 993 mg daily Folic acid 1 mg daily Metoprolol tartrate 25 mg 2 times a day Thiamine 100 mg daily Warfarin dosing per the pharmacy Furosemide 40 mg IV 2 times a day for  Labs:  Sodium 137, potassium 3.9, chloride 110, CO2 19, BUN 18, creatinine 1.11, INR 3.5, BNP 1766, magnesium 2.0 Weight is 70.6 kg Blood pressure 122/92   Initial meeting with patient and wife who was at the bedside.  Discussed how his heart failure and the function of his heart relates to the normal left ventricle.  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  Went over sodium restriction, both say that when they go to the grocery store it takes them longer as they are label readers.  Doing fresh and frozen, avoid foods that are processed.  Has not used salt for a long time.  Also reasoning behind fluid restriction.  Stressed the importance of daily weights, routine and recording.  What to report.  He was given a scale to keep in his hospital room bathroom, discussed emptying bladder, then on the scale and record.  He was given a sheet to record his weights on he voices understanding that this scale stays at the hospital.  Also went over signs and symptoms to report.  He was given  the living with heart failure teaching booklet, zone magnet and information on low-sodium.  Pricilla Riffle RN [facility]

## 2021-12-06 LAB — LIPID PANEL
Cholesterol: 157 mg/dL (ref 0–200)
HDL: 54 mg/dL (ref >40)
LDL Cholesterol: 92 mg/dL (ref 0–99)
Total CHOL/HDL Ratio: 2.9 RATIO
Triglycerides: 54 mg/dL (ref <150)
VLDL: 11 mg/dL (ref 0–40)

## 2021-12-06 LAB — BASIC METABOLIC PANEL
Anion gap: 9 (ref 5–15)
BUN: 18 mg/dL (ref 8–23)
CO2: 24 mmol/L (ref 22–32)
Calcium: 8.4 mg/dL — ABNORMAL LOW (ref 8.9–10.3)
Chloride: 106 mmol/L (ref 98–111)
Creatinine, Ser: 0.99 mg/dL (ref 0.61–1.24)
GFR, Estimated: >60 mL/min (ref >60)
Glucose, Bld: 90 mg/dL (ref 70–99)
Potassium: 3.3 mmol/L — ABNORMAL LOW (ref 3.5–5.1)
Sodium: 139 mmol/L (ref 135–145)

## 2021-12-06 LAB — PROTIME-INR
INR: 2.7 — ABNORMAL HIGH (ref 0.8–1.2)
Prothrombin Time: 28.7 seconds — ABNORMAL HIGH (ref 11.4–15.2)

## 2021-12-06 LAB — CBC
HCT: 40.3 % (ref 39.0–52.0)
Hemoglobin: 13.5 g/dL (ref 13.0–17.0)
MCH: 34.2 pg — ABNORMAL HIGH (ref 26.0–34.0)
MCHC: 33.5 g/dL (ref 30.0–36.0)
MCV: 102 fL — ABNORMAL HIGH (ref 80.0–100.0)
Platelets: 308 10*3/uL (ref 150–400)
RBC: 3.95 MIL/uL — ABNORMAL LOW (ref 4.22–5.81)
RDW: 13.6 % (ref 11.5–15.5)
WBC: 8.7 10*3/uL (ref 4.0–10.5)
nRBC: 0 % (ref 0.0–0.2)

## 2021-12-06 LAB — MAGNESIUM: Magnesium: 1.8 mg/dL (ref 1.7–2.4)

## 2021-12-06 MED ORDER — FUROSEMIDE 40 MG PO TABS: 40.0000 mg | ORAL_TABLET | Freq: Every day | ORAL | Status: DC

## 2021-12-06 MED ORDER — WARFARIN SODIUM 4 MG PO TABS
4.0000 mg | ORAL_TABLET | Freq: Once | ORAL | Status: AC
  Administered 2021-12-06: 4 mg via ORAL
  Filled 2021-12-06: qty 1

## 2021-12-06 MED ORDER — MAGNESIUM SULFATE 2 GM/50ML IV SOLN
2.0000 g | Freq: Once | INTRAVENOUS | Status: AC
  Administered 2021-12-06: 2 g via INTRAVENOUS
  Filled 2021-12-06: qty 50

## 2021-12-06 MED ORDER — POTASSIUM CHLORIDE 20 MEQ PO PACK
40.0000 meq | PACK | Freq: Once | ORAL | Status: AC
  Administered 2021-12-06: 40 meq via ORAL
  Filled 2021-12-06: qty 2

## 2021-12-06 NOTE — Plan of Care (Signed)

## 2021-12-06 NOTE — Assessment & Plan Note (Signed)
Continue as needed Xanax and Zoloft

## 2021-12-06 NOTE — Progress Notes (Signed)
ANTICOAGULATION CONSULT NOTE  Pharmacy Consult for warfarin Indication: atrial fibrillation  No Known Allergies  Patient Measurements: Height: 5' 10.5" (179.1 cm) Weight: 62.2 kg (137 lb 2 oz) IBW/kg (Calculated) : 74.15  Vital Signs: Temp: 97.8 F (36.6 C) (02/15 0435) Temp Source: Oral (02/15 0036) BP: 130/77 (02/15 0435) Pulse Rate: 60 (02/15 0435)  Labs: Recent Labs    12/05/21 0226 12/05/21 0500 12/05/21 0818 12/05/21 1023 12/06/21 0606  HGB 14.4  --   --   --  13.5  HCT 43.3  --   --   --  40.3  PLT 327  --   --   --  308  LABPROT 35.3*  --   --   --  28.7*  INR 3.5*  --   --   --  2.7*  CREATININE 1.11  --   --   --  0.99  TROPONINIHS 37* 35* 34* 32*  --      Estimated Creatinine Clearance: 54.1 mL/min (by C-G formula based on SCr of 0.99 mg/dL).   Medical History: Past Medical History:  Diagnosis Date   Anxiety    Chronic atrial fibrillation (HCC)    Chronic systolic CHF (congestive heart failure) (HCC)    Pacemaker    SSS (sick sinus syndrome) (HCC)     Medications:  Medications Prior to Admission  Medication Sig Dispense Refill Last Dose   ALPRAZolam (XANAX) 0.5 MG tablet Take 1 tablet (0.5 mg total) by mouth 3 (three) times daily. 20 tablet 0 12/04/2021   amiodarone (PACERONE) 200 MG tablet Take 1 tablet (200 mg total) by mouth daily. 30 tablet 1 12/04/2021   budesonide (ENTOCORT EC) 3 MG 24 hr capsule Take 6-9 mg by mouth daily.   12/04/2021   JANTOVEN 5 MG tablet Take 5 mg by mouth daily.   12/04/2021   metoprolol tartrate (LOPRESSOR) 25 MG tablet Take 1 tablet (25 mg total) by mouth 2 (two) times daily. 60 tablet 2 12/04/2021   Multiple Vitamin (MULTIVITAMIN WITH MINERALS) TABS tablet Take 1 tablet by mouth daily. 30 tablet 0 12/04/2021   sertraline (ZOLOFT) 25 MG tablet Take 25 mg by mouth daily.   12/04/2021   thiamine 100 MG tablet Take 1 tablet (100 mg total) by mouth daily. 30 tablet 0 12/04/2021   Scheduled:   amiodarone  200 mg Oral Daily    budesonide  6-9 mg Oral Daily   folic acid  1 mg Oral Daily   furosemide  40 mg Intravenous BID   LORazepam  0-4 mg Intravenous Q6H   Followed by   Derrill Memo ON 12/07/2021] LORazepam  0-4 mg Intravenous Q12H   metoprolol tartrate  25 mg Oral BID   multivitamin with minerals  1 tablet Oral Daily   sertraline  25 mg Oral Daily   thiamine  100 mg Oral Daily   Or   thiamine  100 mg Intravenous Daily   Warfarin - Pharmacist Dosing Inpatient   Does not apply q1600   Infusions:  PRN: acetaminophen, albuterol, ALPRAZolam, dextromethorphan-guaiFENesin, diphenhydrAMINE, LORazepam **OR** LORazepam, morphine injection, nitroGLYCERIN Anti-infectives (From admission, onward)    None       Assessment: 76YOM PMH A-fib on Coumadin, sick sinus syndrome status post pacemaker, CHF with a EF of 25 to 30%, alcohol abuse, HTN, CAD. CHADSVASC ~5. On warfarin PTA Warfarin 5 mg daily (last dose 2/13)  Recent admission 2/1-2/4. At that time, amiodarone started for atrial fibrillation. S/p loading dose, now on maintenance amiodarone 200 mg  daily.   DDI: PTA amiodarone 200 mg daily   Goal of Therapy:  INR 2-3 Monitor platelets by anticoagulation protocol: Yes   Date INR Warfarin Dose  2/14 3.5 Warfarin 2 mg  2/15 2.7 Warfarin 4 mg    Plan: Give warfarin 4 mg x 1 INR reduction by 0.8 points, now within goal, after reduced warfarin dose yesterday. Given new amiodarone (~2 weeks ago) drug interaction, will decrease weekly dose by ~20%.  Daily INR, CBC   [name], PharmD Pharmacy Resident  12/06/2021 7:33 AM

## 2021-12-06 NOTE — Progress Notes (Signed)
° °  Heart Failure Nurse Navigator Note  Met with patient and wife, Marcie Bal who was at the bedside.  Patient has been utilizing his scale, noted weight loss between last evening weight and his weight first thing this morning.  Reinforced that when at home it is okay if he wants to weigh himself throughout the day but the most important weights to compare is the ones done first thing in the morning.  They both voiced understanding.  Patient is also keeping track of his urine output.  He states that he feels that his breathing is better but noted that he felt a little woozy with bending over to pick something off the floor this morning.  Pricilla Riffle RN CHFN

## 2021-12-06 NOTE — Assessment & Plan Note (Signed)
Continue amiodarone for rhythm control.  Metoprolol for rate control and warfarin for anticoagulation

## 2021-12-06 NOTE — Progress Notes (Signed)
°  Progress Note   Patient: Collin Knight. WCB:[id number] DOB: April 01, 1946 DOA: 12/05/2021     1 DOS: the patient was seen and examined on 12/06/2021   Brief Knight course: 76 year old male with a known history of chronic systolic CHF with EF 61-60%, A fib on Couamdin, lymphocytic colitis, SSS (s/p of pacemaker), alcohol abuse, depression with anxiety admitted for CHF exacerbation  2/15: Being diuresed.  Seen by cardiology   Assessment and Plan: * Acute on chronic systolic CHF (congestive heart failure) ([place])- (present on admission) Continue IV Lasix for now.  Appreciate cardiology input Net IO Since Admission: -1,820 mL [12/06/21 1421]  CHF education provided  Atrial fibrillation, chronic ([place])- (present on admission) Continue amiodarone for rhythm control.  Metoprolol for rate control and warfarin for anticoagulation  Depression with anxiety- (present on admission) Continue as needed Xanax and Zoloft  Alcohol use disorder, mild, abuse- (present on admission) Reportedly drinks 3-4 beers per day.  Recommended cessation Counseled.        Subjective: Feeling much better than when he first came in.  Still has episodes of dyspnea early morning.  Has some difficulty hearing on the left ear  Physical Exam: Vitals:   12/06/21 0435 12/06/21 0500 12/06/21 0826 12/06/21 1100  BP: 130/77  (!) 123/95 (!) 118/91  Pulse: 60  (!) 58 60  Resp: 16  16 16   Temp: 97.8 F (36.6 C)  97.7 F (36.5 C) 97.7 F (36.5 C)  TempSrc:      SpO2: 93%  93% 94%  Weight:  62.2 kg    Height:       General: Not in acute distress HEENT:       Eyes: PERRL, EOMI, no scleral icterus.       ENT: No discharge from the ears and nose, no pharynx injection, no tonsillar enlargement.        Neck:  No JVD, no bruit, no mass felt. Heme: No neck lymph node enlargement. Cardiac: S1/S2, RRR, No murmurs, No gallops or rubs. Respiratory:  Clear to auscultation bilaterally GI: Soft, nondistended,  nontender, no rebound pain, no organomegaly, BS present. GU: No hematuria Ext:  trace pitting leg edema bilaterally. 1+DP/PT pulse bilaterally. Musculoskeletal: No joint deformities, No joint redness or warmth, no limitation of ROM in spin. Skin: No rashes.  Neuro: Alert, oriented X3, cranial nerves II-XII grossly intact, moves all extremities normally.  Psych: Patient is not psychotic, no suicidal or hemocidal ideation.  Data Reviewed:  Potassium 3.3  Family Communication: None at bedside  Disposition: Status is: Inpatient Remains inpatient appropriate because: Ongoing diuresis   DVT prophylaxis-warfarin   Planned Discharge Destination: Home    Time spent: 35 minutes  Author: Max Sane, MD 12/06/2021 2:23 PM  For on call review [email or web address].

## 2021-12-06 NOTE — Assessment & Plan Note (Signed)
Continue IV Lasix for now.  Appreciate cardiology input Net IO Since Admission: -1,820 mL [12/06/21 1421]  CHF education provided

## 2021-12-06 NOTE — Progress Notes (Signed)
[place] CARDIOLOGY CONSULT NOTE       Patient ID: [facility]. MRN: [id number] DOB/AGE: [birth year] 76 y.o.  Admit date: 12/05/2021 Referring Physician D.r Ivor Costa Primary Physician  Primary Cardiologist Dr. 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  He was found to be in A. fib with RVR and converted to NSR with amiodarone gtt.  Echocardiogram during that admission showed newly reduced EF 20-25% and was previously greater than 55% 1 year ago. He was discharged on 2/4 with plans for outpatient cardiac work up with his regular cardiologist.   Interval History:  -continues to feel better. Ambulated some last night and this am with some dyspnea and needing to stop to catch his breath, but overall improving. No chest pain.  -great OP with IV lasix, had been keeping track on his weight and is down ~4kg since admission.  -renal function stable. Primary team replacing Mag and K+   Review of systems complete and found to be negative unless listed above   Past Medical History:  Diagnosis Date   Anxiety    Chronic atrial fibrillation (HCC)    Chronic systolic CHF (congestive heart failure) (HCC)    Pacemaker    SSS (sick sinus syndrome) ([place])     Past Surgical History:  Procedure Laterality Date   COLONOSCOPY WITH PROPOFOL N/A 09/25/2021   Procedure: COLONOSCOPY WITH PROPOFOL;  Surgeon: [name], DO;  Location: [facility] ENDOSCOPY;  Service: Gastroenterology;  Laterality: N/A;   EYE SURGERY     cataract both eyes   PACEMAKER LEADLESS INSERTION N/A 09/30/2020   Procedure: PACEMAKER LEADLESS INSERTION;  Surgeon:  Isaias Cowman, MD;  Location: [facility] LAB;  Service: Cardiovascular;  Laterality: N/A;    Medications Prior to Admission  Medication Sig Dispense Refill Last Dose   ALPRAZolam (XANAX) 0.5 MG tablet Take 1 tablet (0.5 mg total) by mouth 3 (three) times daily. 20 tablet 0 12/04/2021   amiodarone (PACERONE) 200 MG tablet Take 1 tablet (200 mg total) by mouth daily. 30 tablet 1 12/04/2021   budesonide (ENTOCORT EC) 3 MG 24 hr capsule Take 6-9 mg by mouth daily.   12/04/2021   JANTOVEN 5 MG tablet Take 5 mg by mouth daily.   12/04/2021   metoprolol tartrate (LOPRESSOR) 25 MG tablet Take 1 tablet (25 mg total) by mouth 2 (two) times daily. 60 tablet 2 12/04/2021   Multiple Vitamin (MULTIVITAMIN WITH MINERALS) TABS tablet Take 1 tablet by mouth daily. 30 tablet 0 12/04/2021   sertraline (ZOLOFT) 25 MG tablet Take 25 mg by mouth daily.   12/04/2021   thiamine 100 MG tablet Take 1 tablet (100 mg total) by mouth daily. 30 tablet 0 12/04/2021   Social History   Socioeconomic History   Marital status: Married    Spouse name: Not on file   Number of children: Not on file   Years of education: Not on file   Highest education level: Not on file  Occupational History   Not on file  Tobacco Use   Smoking status: Never   Smokeless tobacco: Never  Vaping Use   Vaping Use: Never used  Substance and Sexual Activity   Alcohol use: Yes  Alcohol/week: 21.0 standard drinks    Types: 21 Cans of beer per week    Comment: 3 beers a day   Drug use: Never   Sexual activity: Yes  Other Topics Concern   Not on file  Social History Narrative   Not on file   Social Determinants of Health   Financial Resource Strain: Not on file  Food Insecurity: Not on file  Transportation Needs: Not on file  Physical Activity: Not on file  Stress: Not on file  Social Connections: Not on file  Intimate Partner Violence: Not on file    Family History  Problem Relation Age of Onset   Stroke Mother     Cancer - Lung Father       Review of systems complete and found to be negative unless listed above    PHYSICAL EXAM General: Elderly appearing and thin caucasian male, in no acute distress. Sitting upright in PCU bed.   HEENT:  Normocephalic and atraumatic. Hearing aid present in right ear. Neck:   No JVD.  Lungs: Normal respiratory effort on room air.  Trace left basilar crackles Heart: HRRR with frequent extra beats. Normal S1 and S2 without gallops or murmurs. Radial & DP pulses 2+ bilaterally. Abdomen: Non-distended appearing.  Msk: Normal strength and tone for age. Extremities: Warm and well perfused. No clubbing, cyanosis.  No peripheral edema.  Neuro: Alert and oriented X 3. Psych:  Answers questions appropriately.   Labs:   Lab Results  Component Value Date   WBC 8.7 12/06/2021   HGB 13.5 12/06/2021   HCT 40.3 12/06/2021   MCV 102.0 (H) 12/06/2021   PLT 308 12/06/2021    Recent Labs  Lab 12/06/21 0606  NA 139  K 3.3*  CL 106  CO2 24  BUN 18  CREATININE 0.99  CALCIUM 8.4*  GLUCOSE 90    No results found for: CKTOTAL, CKMB, CKMBINDEX, TROPONINI  Lab Results  Component Value Date   CHOL 157 12/06/2021   Lab Results  Component Value Date   HDL 54 12/06/2021   Lab Results  Component Value Date   LDLCALC 92 12/06/2021   Lab Results  Component Value Date   TRIG 54 12/06/2021   Lab Results  Component Value Date   CHOLHDL 2.9 12/06/2021   No results found for: LDLDIRECT    Radiology: [facility] Chest Port 1 View  Result Date: 12/05/2021 CLINICAL DATA:  Shortness of breath and left-sided chest pain EXAM: PORTABLE CHEST 1 VIEW COMPARISON:  11/21/2021 FINDINGS: Small pleural effusions with bibasilar opacities, right greater than left. Mild cardiomegaly. IMPRESSION: Small bilateral pleural effusions and basilar opacities, likely atelectasis and/or pulmonary edema. Cardiomegaly. Electronically Signed   By: Ulyses Jarred M.D.   On: 12/05/2021 02:35   DG Chest  Port 1 View  Result Date: 11/21/2021 CLINICAL DATA:  Chest pain, shortness of breath. EXAM: PORTABLE CHEST 1 VIEW COMPARISON:  September 29, 2020. FINDINGS: Stable cardiomediastinal silhouette. Mild bibasilar subsegmental atelectasis or edema is noted with probable small bilateral pleural effusions. The visualized skeletal structures are unremarkable. IMPRESSION: Mild bibasilar subsegmental atelectasis or edema is noted with probable small bilateral pleural effusions. Electronically Signed   By: Marijo Conception M.D.   On: 11/21/2021 15:32   ECHOCARDIOGRAM COMPLETE  Result Date: 11/22/2021    ECHOCARDIOGRAM REPORT   Patient Name:   Collin Knight Date of Exam: 11/22/2021 Medical Rec #:  [id number]  Height:       70.0 in Accession #:  [id number] Weight:       165.0 lb Date of Birth:  [birth year]   BSA:          1.923 m Patient Age:    36 years   BP:           118/58 mmHg Patient Gender: M          HR:           108 bpm. Exam Location:  [facility] Procedure: 2D Echo, Color Doppler and Cardiac Doppler Indications:     R94.31 Abnormal ECG  History:         Patient has prior history of Echocardiogram examinations. CAD,                  Pacemaker, Signs/Symptoms:Shortness of Breath and Fatigue; Risk                  Factors:Hypertension.  Sonographer:     Charmayne Sheer Referring Phys:  [id number] [facility] DAHAL Diagnosing Phys: Donnelly Angelica  Sonographer Comments: Suboptimal parasternal window. IMPRESSIONS  1. Left ventricular ejection fraction, by estimation, is 20 to 25%. The left ventricle has severely decreased function. The left ventricle demonstrates global hypokinesis. The left ventricular internal cavity size was mildly dilated. Left ventricular diastolic parameters are indeterminate.  2. Right ventricular systolic function is mildly reduced. The right ventricular size is mildly enlarged.  3. Left atrial size was mildly dilated.  4. Right atrial size was moderately dilated.  5. The mitral valve is normal in structure. No evidence of  mitral valve regurgitation. No evidence of mitral stenosis.  6. The aortic valve is normal in structure. Aortic valve regurgitation is not visualized. No aortic stenosis is present.  7. The inferior vena cava is dilated in size with <50% respiratory variability, suggesting right atrial pressure of 15 mmHg. FINDINGS  Left Ventricle: Left ventricular ejection fraction, by estimation, is 20 to 25%. The left ventricle has severely decreased function. The left ventricle demonstrates global hypokinesis. The left ventricular internal cavity size was mildly dilated. There is no left ventricular hypertrophy. Left ventricular diastolic parameters are indeterminate. Right Ventricle: The right ventricular size is mildly enlarged. No increase in right ventricular wall thickness. Right ventricular systolic function is mildly reduced. Left Atrium: Left atrial size was mildly dilated. Right Atrium: Right atrial size was moderately dilated. Pericardium: There is no evidence of pericardial effusion. Mitral Valve: The mitral valve is normal in structure. No evidence of mitral valve regurgitation. No evidence of mitral valve stenosis. MV peak gradient, 5.1 mmHg. The mean mitral valve gradient is 2.0 mmHg. Tricuspid Valve: The tricuspid valve is normal in structure. Tricuspid valve regurgitation is trivial. Aortic Valve: The aortic valve is normal in structure. Aortic valve regurgitation is not visualized. No aortic stenosis is present. Aortic valve mean gradient measures 2.0 mmHg. Aortic valve peak gradient measures 3.6 mmHg. Aortic valve area, by VTI measures 2.82 cm. Pulmonic Valve: The pulmonic valve was not well visualized. Aorta: The aortic root is normal in size and structure. Venous: The inferior vena cava is dilated in size with less than 50% respiratory variability, suggesting right atrial pressure of 15 mmHg. IAS/Shunts: The interatrial septum was not well visualized.  LEFT VENTRICLE PLAX 2D LVIDd:         5.63 cm    Diastology LVIDs:         4.33 cm   LV e' lateral:   5.22 cm/s LV PW:         1.15  cm   LV E/e' lateral: 18.0 LV IVS:        0.80 cm LVOT diam:     2.40 cm LV SV:         36 LV SV Index:   19 LVOT Area:     4.52 cm  RIGHT VENTRICLE RV Basal diam:  4.35 cm LEFT ATRIUM             Index        RIGHT ATRIUM           Index LA diam:        4.10 cm 2.13 cm/m   RA Area:     26.30 cm LA Vol (A2C):   60.5 ml 31.45 ml/m  RA Volume:   94.70 ml  49.23 ml/m LA Vol (A4C):   59.6 ml 30.99 ml/m LA Biplane Vol: 60.3 ml 31.35 ml/m  AORTIC VALVE                    PULMONIC VALVE AV Area (Vmax):    2.59 cm     PV Vmax:       0.59 m/s AV Area (Vmean):   2.70 cm     PV Vmean:      38.100 cm/s AV Area (VTI):     2.82 cm     PV VTI:        0.090 m AV Vmax:           94.40 cm/s   PV Peak grad:  1.4 mmHg AV Vmean:          62.400 cm/s  PV Mean grad:  1.0 mmHg AV VTI:            0.129 m AV Peak Grad:      3.6 mmHg AV Mean Grad:      2.0 mmHg LVOT Vmax:         54.10 cm/s LVOT Vmean:        37.200 cm/s LVOT VTI:          0.080 m LVOT/AV VTI ratio: 0.62  AORTA Ao Root diam: 3.50 cm MITRAL VALVE MV Area (PHT): 5.13 cm    SHUNTS MV Area VTI:   1.49 cm    Systemic VTI:  0.08 m MV Peak grad:  5.1 mmHg    Systemic Diam: 2.40 cm MV Mean grad:  2.0 mmHg MV Vmax:       1.13 m/s MV Vmean:      68.2 cm/s MV Decel Time: 148 msec MV E velocity: 94.05 cm/s Donnelly Angelica Electronically signed by Donnelly Angelica Signature Date/Time: 11/22/2021/1:01:37 PM    Final     ECHO LVEF 20-25%  TELEMETRY reviewed by me: sinus brady rate 50s, some periods of paced beats  EKG reviewed by me: Normal sinus rhythm rate 61, frequent PVCs, nonspecific T wave abnormality, poor R wave progression  ASSESSMENT AND PLAN:  The patient is a 56yoM with PMH significant for paroxysmal atrial fibrillation anticoagulated on warfarin, 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  He was found to be in A. fib with RVR and converted to NSR with amiodarone gtt.  Echocardiogram during that admission showed newly reduced EF 20-25% and was previously greater than 55% 1 year ago. He was discharged on 2/4 with plans for outpatient cardiac work up  with his regular cardiologist.  He came back to [facility] ED 12/05/2021 with worsening shortness of breath since he was discharged.  #Acute on chronic HFrEF (11/22/21 LVEF 20-25%) Patient presented 10 days after discharge with progressively worsening shortness of breath with exertion.  He was euvolemic at discharge and not sent home with a diuretic.  He reports worsening shortness of breath to the point that he is unable to speak or lay flat.  Chest x-ray revealed small bilateral pleural effusions, BNP elevated to 1766.  Troponin flat. -S/p 20 mg of IV Lasix on admission with good output per patient. -Continue 40 mg IV Lasix BID ending tomorrow morning. Will send home on 40mg  PO lasix once daily.  -Continue metoprolol tartrate 25 mg twice daily -Appreciate heart failure nurse providing education and resources. Patient very receptive to instruction.  -Recommend strict I's and O's, daily weights, fluid and salt restriction -likely ok for discharge tomorrow after morning lasix.  -The patient will need close follow-up at discharge for consideration of further cardiac work-up.  He has an appointment with [facility] cardiology on 12/12/2021.  #Paroxysmal atrial fibrillation with RVR on warfarin  #history of sinus pauses s/p micra pacemaker placement 09/2020 Currently in normal sinus rhythm with periods of heart rate in the high 50s. -Continue warfarin, appreciate pharmacy assistance with dosing. -INR 3.5, daily INR checks -Continue amiodarone 200 mg once daily for rhythm control -Continue metoprolol as above  #alcohol abuse #anxiety -Reportedly drinks 3-4  beers per day.  Strongly recommended cessation from alcohol.  This patient's plan of care was discussed and created with Dr. Nehemiah Massed and he is in agreement.  Signed: Tristan Schroeder , PA-C 12/06/2021, 9:42 AM [facility] Cardiology

## 2021-12-06 NOTE — Assessment & Plan Note (Signed)
Reportedly drinks 3-4 beers per day.  Recommended cessation Counseled.

## 2021-12-06 NOTE — Hospital Course (Signed)
76 year old male with a known history of chronic systolic CHF with EF 63-01%, A fib on Couamdin, lymphocytic colitis, SSS (s/p of pacemaker), alcohol abuse, depression with anxiety admitted for CHF exacerbation  2/15: Being diuresed.  Seen by cardiology

## 2021-12-07 ENCOUNTER — Other Ambulatory Visit: Payer: Self-pay | Admitting: Internal Medicine

## 2021-12-07 DIAGNOSIS — I5023 Acute on chronic systolic (congestive) heart failure: Secondary | ICD-10-CM

## 2021-12-07 LAB — BASIC METABOLIC PANEL
Anion gap: 8 (ref 5–15)
BUN: 17 mg/dL (ref 8–23)
CO2: 23 mmol/L (ref 22–32)
Calcium: 8.4 mg/dL — ABNORMAL LOW (ref 8.9–10.3)
Chloride: 111 mmol/L (ref 98–111)
Creatinine, Ser: 1.1 mg/dL (ref 0.61–1.24)
GFR, Estimated: >60 mL/min (ref >60)
Glucose, Bld: 90 mg/dL (ref 70–99)
Potassium: 3.8 mmol/L (ref 3.5–5.1)
Sodium: 142 mmol/L (ref 135–145)

## 2021-12-07 LAB — CBC
HCT: 42.2 % (ref 39.0–52.0)
Hemoglobin: 14.1 g/dL (ref 13.0–17.0)
MCH: 34.2 pg — ABNORMAL HIGH (ref 26.0–34.0)
MCHC: 33.4 g/dL (ref 30.0–36.0)
MCV: 102.4 fL — ABNORMAL HIGH (ref 80.0–100.0)
Platelets: 308 10*3/uL (ref 150–400)
RBC: 4.12 MIL/uL — ABNORMAL LOW (ref 4.22–5.81)
RDW: 13.5 % (ref 11.5–15.5)
WBC: 9.7 10*3/uL (ref 4.0–10.5)
nRBC: 0 % (ref 0.0–0.2)

## 2021-12-07 LAB — MAGNESIUM: Magnesium: 2.1 mg/dL (ref 1.7–2.4)

## 2021-12-07 LAB — PROTIME-INR
INR: 2.4 — ABNORMAL HIGH (ref 0.8–1.2)
Prothrombin Time: 26.3 seconds — ABNORMAL HIGH (ref 11.4–15.2)

## 2021-12-07 MED ORDER — WARFARIN SODIUM 2 MG PO TABS: 2.0000 mg | ORAL_TABLET | Freq: Once | ORAL | 0 refills | Status: DC

## 2021-12-07 MED ORDER — WARFARIN SODIUM 2 MG PO TABS: 4.0000 mg | ORAL_TABLET | Freq: Once | ORAL | 0 refills | Status: DC

## 2021-12-07 MED ORDER — METOPROLOL SUCCINATE ER 50 MG PO TB24: 50.0000 mg | ORAL_TABLET | Freq: Every day | ORAL | 0 refills | Status: DC

## 2021-12-07 MED ORDER — FUROSEMIDE 40 MG PO TABS: 40.0000 mg | ORAL_TABLET | Freq: Every day | ORAL | 0 refills | Status: DC

## 2021-12-07 NOTE — Progress Notes (Signed)
[place] CARDIOLOGY CONSULT NOTE       Patient ID: [facility]. MRN: [id number] DOB/AGE: June 23, 1946 76 y.o.  Admit date: 12/05/2021 Referring Physician D.r Ivor Costa Primary Physician  Primary Cardiologist Dr. 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  He was found to be in A. fib with RVR and converted to NSR with amiodarone gtt.  Echocardiogram during that admission showed newly reduced EF 20-25% and was previously greater than 55% 1 year ago. He was discharged on 2/4 with plans for outpatient cardiac work up with his regular cardiologist.   Interval History:  -continues to feel better. Ambulated some last night and this am with some dyspnea and needing to stop to catch his breath, but overall improving. No chest pain.  -great OP with IV lasix, had been keeping track on his weight and is down ~4kg since admission.  And no concerns today. -renal function stable. Primary team replacing Mag and K+  -Patient is ready to go and feels well Review of systems complete and found to be negative unless listed above   Past Medical History:  Diagnosis Date   Anxiety    Chronic atrial fibrillation (HCC)    Chronic systolic CHF (congestive heart failure) (HCC)    Pacemaker    SSS (sick sinus syndrome) ([place])     Past Surgical History:  Procedure Laterality Date   COLONOSCOPY WITH PROPOFOL N/A 09/25/2021   Procedure: COLONOSCOPY WITH PROPOFOL;  Surgeon: [name], DO;  Location: [facility] ENDOSCOPY;  Service: Gastroenterology;  Laterality: N/A;   EYE SURGERY     cataract both eyes   PACEMAKER LEADLESS INSERTION N/A 09/30/2020    Procedure: PACEMAKER LEADLESS INSERTION;  Surgeon: Isaias Cowman, MD;  Location: [place] CV LAB;  Service: Cardiovascular;  Laterality: N/A;    Medications Prior to Admission  Medication Sig Dispense Refill Last Dose   ALPRAZolam (XANAX) 0.5 MG tablet Take 1 tablet (0.5 mg total) by mouth 3 (three) times daily. 20 tablet 0 12/04/2021   amiodarone (PACERONE) 200 MG tablet Take 1 tablet (200 mg total) by mouth daily. 30 tablet 1 12/04/2021   budesonide (ENTOCORT EC) 3 MG 24 hr capsule Take 6-9 mg by mouth daily.   12/04/2021   JANTOVEN 5 MG tablet Take 5 mg by mouth daily.   12/04/2021   metoprolol tartrate (LOPRESSOR) 25 MG tablet Take 1 tablet (25 mg total) by mouth 2 (two) times daily. 60 tablet 2 12/04/2021   Multiple Vitamin (MULTIVITAMIN WITH MINERALS) TABS tablet Take 1 tablet by mouth daily. 30 tablet 0 12/04/2021   sertraline (ZOLOFT) 25 MG tablet Take 25 mg by mouth daily.   12/04/2021   thiamine 100 MG tablet Take 1 tablet (100 mg total) by mouth daily. 30 tablet 0 12/04/2021   Social History   Socioeconomic History   Marital status: Married    Spouse name: Not on file   Number of children: Not on file   Years of education: Not on file   Highest education level: Not on file  Occupational History   Not on file  Tobacco Use   Smoking status: Never   Smokeless tobacco: Never  Vaping Use   Vaping Use: Never used  Substance and Sexual Activity   Alcohol use: Yes    Alcohol/week: 21.0 standard drinks    Types: 21 Cans of beer per week    Comment: 3 beers a day   Drug use: Never   Sexual activity: Yes  Other Topics Concern   Not on file  Social History Narrative   Not on file   Social Determinants of Health   Financial Resource Strain: Not on file  Food Insecurity: Not on file  Transportation Needs: Not on file  Physical Activity: Not on file  Stress: Not on file  Social Connections: Not on file  Intimate Partner Violence: Not on file    Family History   Problem Relation Age of Onset   Stroke Mother    Cancer - Lung Father       Review of systems complete and found to be negative unless listed above    PHYSICAL EXAM General: Elderly appearing and thin caucasian male, in no acute distress. Sitting upright in PCU bed.   HEENT:  Normocephalic and atraumatic. Hearing aid present in right ear. Neck:   No JVD.  Lungs: Normal respiratory effort on room air.  Trace left basilar crackles Heart: HRRR with frequent extra beats. Normal S1 and S2 without gallops or murmurs. Radial & DP pulses 2+ bilaterally. Abdomen: Non-distended appearing.  Msk: Normal strength and tone for age. Extremities: Warm and well perfused. No clubbing, cyanosis.  No peripheral edema.  Neuro: Alert and oriented X 3. Psych:  Answers questions appropriately.   Labs:   Lab Results  Component Value Date   WBC 9.7 12/07/2021   HGB 14.1 12/07/2021   HCT 42.2 12/07/2021   MCV 102.4 (H) 12/07/2021   PLT 308 12/07/2021    Recent Labs  Lab 12/07/21 0455  NA 142  K 3.8  CL 111  CO2 23  BUN 17  CREATININE 1.10  CALCIUM 8.4*  GLUCOSE 90    No results found for: CKTOTAL, CKMB, CKMBINDEX, TROPONINI  Lab Results  Component Value Date   CHOL 157 12/06/2021   Lab Results  Component Value Date   HDL 54 12/06/2021   Lab Results  Component Value Date   LDLCALC 92 12/06/2021   Lab Results  Component Value Date   TRIG 54 12/06/2021   Lab Results  Component Value Date   CHOLHDL 2.9 12/06/2021   No results found for: LDLDIRECT    Radiology: [facility] Chest Port 1 View  Result Date: 12/05/2021 CLINICAL DATA:  Shortness of breath and left-sided chest pain EXAM: PORTABLE CHEST 1 VIEW COMPARISON:  11/21/2021 FINDINGS: Small pleural effusions with bibasilar opacities, right greater than left. Mild cardiomegaly. IMPRESSION: Small bilateral pleural effusions and basilar opacities, likely atelectasis and/or pulmonary edema. Cardiomegaly. Electronically Signed   By: Ulyses Jarred M.D.   On: 12/05/2021 02:35   DG Chest Port 1 View  Result Date: 11/21/2021 CLINICAL DATA:  Chest pain, shortness of breath. EXAM: PORTABLE CHEST 1 VIEW COMPARISON:  September 29, 2020. FINDINGS: Stable cardiomediastinal silhouette. Mild bibasilar subsegmental atelectasis or edema is noted with probable small bilateral pleural effusions. The visualized skeletal structures are unremarkable. IMPRESSION: Mild bibasilar subsegmental atelectasis or edema is noted with probable small bilateral pleural effusions. Electronically Signed   By: Marijo Conception M.D.   On: 11/21/2021 15:32   ECHOCARDIOGRAM COMPLETE  Result Date: 11/22/2021    ECHOCARDIOGRAM REPORT   Patient Name:   Collin Knight Date of Exam: 11/22/2021 Medical Rec #:  [id number]  Height:  70.0 in Accession #:    [id number] Weight:       165.0 lb Date of Birth:  1946-01-15   BSA:          1.923 m Patient Age:    38 years   BP:           118/58 mmHg Patient Gender: M          HR:           108 bpm. Exam Location:  [facility] Procedure: 2D Echo, Color Doppler and Cardiac Doppler Indications:     R94.31 Abnormal ECG  History:         Patient has prior history of Echocardiogram examinations. CAD,                  Pacemaker, Signs/Symptoms:Shortness of Breath and Fatigue; Risk                  Factors:Hypertension.  Sonographer:     Charmayne Sheer Referring Phys:  [id number] [facility] DAHAL Diagnosing Phys: Donnelly Angelica  Sonographer Comments: Suboptimal parasternal window. IMPRESSIONS  1. Left ventricular ejection fraction, by estimation, is 20 to 25%. The left ventricle has severely decreased function. The left ventricle demonstrates global hypokinesis. The left ventricular internal cavity size was mildly dilated. Left ventricular diastolic parameters are indeterminate.  2. Right ventricular systolic function is mildly reduced. The right ventricular size is mildly enlarged.  3. Left atrial size was mildly dilated.  4. Right atrial size was moderately dilated.  5. The mitral  valve is normal in structure. No evidence of mitral valve regurgitation. No evidence of mitral stenosis.  6. The aortic valve is normal in structure. Aortic valve regurgitation is not visualized. No aortic stenosis is present.  7. The inferior vena cava is dilated in size with <50% respiratory variability, suggesting right atrial pressure of 15 mmHg. FINDINGS  Left Ventricle: Left ventricular ejection fraction, by estimation, is 20 to 25%. The left ventricle has severely decreased function. The left ventricle demonstrates global hypokinesis. The left ventricular internal cavity size was mildly dilated. There is no left ventricular hypertrophy. Left ventricular diastolic parameters are indeterminate. Right Ventricle: The right ventricular size is mildly enlarged. No increase in right ventricular wall thickness. Right ventricular systolic function is mildly reduced. Left Atrium: Left atrial size was mildly dilated. Right Atrium: Right atrial size was moderately dilated. Pericardium: There is no evidence of pericardial effusion. Mitral Valve: The mitral valve is normal in structure. No evidence of mitral valve regurgitation. No evidence of mitral valve stenosis. MV peak gradient, 5.1 mmHg. The mean mitral valve gradient is 2.0 mmHg. Tricuspid Valve: The tricuspid valve is normal in structure. Tricuspid valve regurgitation is trivial. Aortic Valve: The aortic valve is normal in structure. Aortic valve regurgitation is not visualized. No aortic stenosis is present. Aortic valve mean gradient measures 2.0 mmHg. Aortic valve peak gradient measures 3.6 mmHg. Aortic valve area, by VTI measures 2.82 cm. Pulmonic Valve: The pulmonic valve was not well visualized. Aorta: The aortic root is normal in size and structure. Venous: The inferior vena cava is dilated in size with less than 50% respiratory variability, suggesting right atrial pressure of 15 mmHg. IAS/Shunts: The interatrial septum was not well visualized.  LEFT  VENTRICLE PLAX 2D LVIDd:         5.63 cm   Diastology LVIDs:         4.33 cm   LV e' lateral:   5.22 cm/s LV PW:  1.15 cm   LV E/e' lateral: 18.0 LV IVS:        0.80 cm LVOT diam:     2.40 cm LV SV:         36 LV SV Index:   19 LVOT Area:     4.52 cm  RIGHT VENTRICLE RV Basal diam:  4.35 cm LEFT ATRIUM             Index        RIGHT ATRIUM           Index LA diam:        4.10 cm 2.13 cm/m   RA Area:     26.30 cm LA Vol (A2C):   60.5 ml 31.45 ml/m  RA Volume:   94.70 ml  49.23 ml/m LA Vol (A4C):   59.6 ml 30.99 ml/m LA Biplane Vol: 60.3 ml 31.35 ml/m  AORTIC VALVE                    PULMONIC VALVE AV Area (Vmax):    2.59 cm     PV Vmax:       0.59 m/s AV Area (Vmean):   2.70 cm     PV Vmean:      38.100 cm/s AV Area (VTI):     2.82 cm     PV VTI:        0.090 m AV Vmax:           94.40 cm/s   PV Peak grad:  1.4 mmHg AV Vmean:          62.400 cm/s  PV Mean grad:  1.0 mmHg AV VTI:            0.129 m AV Peak Grad:      3.6 mmHg AV Mean Grad:      2.0 mmHg LVOT Vmax:         54.10 cm/s LVOT Vmean:        37.200 cm/s LVOT VTI:          0.080 m LVOT/AV VTI ratio: 0.62  AORTA Ao Root diam: 3.50 cm MITRAL VALVE MV Area (PHT): 5.13 cm    SHUNTS MV Area VTI:   1.49 cm    Systemic VTI:  0.08 m MV Peak grad:  5.1 mmHg    Systemic Diam: 2.40 cm MV Mean grad:  2.0 mmHg MV Vmax:       1.13 m/s MV Vmean:      68.2 cm/s MV Decel Time: 148 msec MV E velocity: 94.05 cm/s Donnelly Angelica Electronically signed by Donnelly Angelica Signature Date/Time: 11/22/2021/1:01:37 PM    Final     ECHO LVEF 20-25%  TELEMETRY reviewed by me: sinus brady rate 50s, some periods of paced beats  EKG reviewed by me: Normal sinus rhythm rate 61, frequent PVCs, nonspecific T wave abnormality, poor R wave progression  ASSESSMENT AND PLAN:  The patient is a 34yoM with PMH significant for paroxysmal atrial fibrillation anticoagulated on warfarin, 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  He was found to be in A. fib with RVR and converted to NSR with amiodarone gtt.  Echocardiogram during that admission showed newly reduced EF 20-25% and was previously greater than 55% 1 year ago. He was discharged on 2/4 with plans for outpatient cardiac work  up with his regular cardiologist.  He came back to [facility] ED 12/05/2021 with worsening shortness of breath since he was discharged.  #Acute on chronic HFrEF (11/22/21 LVEF 20-25%) Patient presented 10 days after discharge with progressively worsening shortness of breath with exertion.  He was euvolemic at discharge and not sent home with a diuretic.  He reports worsening shortness of breath to the point that he is unable to speak or lay flat.  Chest x-ray revealed small bilateral pleural effusions, BNP elevated to 1766.  Troponin flat. -S/p 20 mg of IV Lasix on admission with good output per patient. -Now ready to change to oral Lasix and be discharged -Continue metoprolol tartrate 25 mg twice daily -Appreciate heart failure nurse providing education and resources. Patient very receptive to instruction.  -Recommend strict I's and O's, daily weights, fluid and salt restriction as an outpatient -likely ok for discharge today if ambulating well -The patient will need close follow-up at discharge for consideration of further cardiac work-up.  He has an appointment with [facility] cardiology on 12/12/2021.  #Paroxysmal atrial fibrillation with RVR on warfarin  #history of sinus pauses s/p micra pacemaker placement 09/2020 Currently in normal sinus rhythm with periods of heart rate in the high 50s. -Continue warfarin, appreciate pharmacy assistance with dosing. -INR 3.5, daily INR checks -Continue amiodarone 200 mg once daily for rhythm control -Continue metoprolol as above  #alcohol abuse #anxiety -Reportedly drinks  3-4 beers per day.  Strongly recommended cessation from alcohol.   Signed: Corey Skains , md facc 12/07/2021, 9:16 AM [facility]

## 2021-12-07 NOTE — Discharge Summary (Signed)
Physician Discharge Summary   Patient: [facility] 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  Seen by cardiology  Assessment and Plan: * Acute on chronic systolic congestive heart failure (HCC) Continue IV Lasix for now.  Appreciate cardiology input Net IO Since Admission: -1,340 mL [12/07/21 1720]  CHF education provided  Atrial fibrillation, chronic ([place])- (present on admission) Continue amiodarone for rhythm control.  Metoprolol for rate control and warfarin for anticoagulation.  Therapeutic INR  Depression with anxiety- (present on admission) Continue as needed Xanax and Zoloft  Alcohol use disorder, mild, abuse- (present on admission) Reportedly drinks 3-4 beers per day.  Recommended cessation         Consultants: Cardiology Disposition: Home with home health PT, OT Diet recommendation:  Discharge Diet Orders (From admission, onward)     Start     Ordered   12/07/21 0000  Diet - low sodium heart healthy        12/07/21 0900           Cardiac diet  DISCHARGE MEDICATION: Allergies as of 12/07/2021   No Known Allergies      Medication List     STOP taking these medications    metoprolol tartrate 25 MG tablet Commonly known as:  LOPRESSOR   sertraline 25 MG tablet Commonly known as: ZOLOFT       TAKE these medications    ALPRAZolam 0.5 MG tablet Commonly known as: XANAX Take 1 tablet (0.5 mg total) by mouth 3 (three) times daily.   amiodarone 200 MG tablet Commonly known as: PACERONE Take 1 tablet (200 mg total) by mouth daily.   budesonide 3 MG 24 hr capsule Commonly known as: ENTOCORT EC Take 6-9 mg by mouth daily.   furosemide 40 MG tablet Commonly known as: Lasix Take 1 tablet (40 mg total) by mouth daily.   Jantoven 5 MG tablet Generic drug: warfarin Take 5 mg by mouth daily. What changed: Another medication with the same name was added. Make sure you understand how and when to take each.   warfarin 2 MG tablet Commonly known as: Coumadin Take 2 tablets (4 mg total) by mouth one time only at 4 PM for 30 doses. What changed: You were already taking a medication with the same name, and this prescription was added. Make sure you understand how and when to take each.   metoprolol succinate 50 MG 24 hr tablet Commonly known as: Toprol XL Take 1 tablet (50 mg total) by mouth daily.   multivitamin with minerals Tabs tablet Take 1 tablet by mouth daily.   thiamine 100 MG tablet Take 1 tablet (100 mg total) by mouth daily.        Follow-up Information     [name], MD. Go on 12/14/2021.   Specialty: Family  Medicine Why: as scheduled, [facility] Discharge F/UP @ 2:15am Contact information: [address] [place] [address] [phone]         Clabe Seal, PA-C. Go on 12/12/2021.   Why: as scheduled, [facility] Discharge F/UP @ 11:30am Contact information: [place] [address] [phone]         Ezzard Standing, PA-C. Schedule an appointment as soon as possible for a visit on 12/14/2021.   Specialty: Gastroenterology Why: as scheduled, [facility] Discharge F/UP                 Discharge Exam: Filed Weights   12/05/21 0225 12/06/21 0500 12/07/21 0500  Weight: 70.6 kg 62.2 kg 63 kg   General: Not in acute distress HEENT:       Eyes: PERRL, EOMI, no scleral icterus.       ENT: No discharge from the ears and nose, no pharynx injection, no tonsillar enlargement.        Neck:  No JVD, no bruit, no mass felt. Heme: No neck lymph node enlargement. Cardiac: S1/S2, RRR, No murmurs, No gallops or rubs. Respiratory:  Clear to auscultation bilaterally GI: Soft, nondistended, nontender, no rebound pain, no organomegaly, BS present. GU: No hematuria Ext:  trace pitting leg edema bilaterally. 1+DP/PT pulse bilaterally. Musculoskeletal: No joint deformities, No joint redness or warmth, no limitation of ROM in spin. Skin: No rashes.  Neuro: Alert, oriented X3, cranial nerves II-XII grossly intact, moves all extremities normally.  Psych: Patient is not psychotic, no suicidal or hemocidal ideation.  Condition at discharge: good  The results of significant diagnostics from this hospitalization (including imaging, microbiology, ancillary and laboratory) are listed below for reference.   Imaging Studies: DG Chest Port 1 View  Result Date: 12/05/2021 CLINICAL DATA:  Shortness of breath and left-sided chest pain EXAM: PORTABLE CHEST 1 VIEW COMPARISON:  11/21/2021 FINDINGS: Small pleural effusions with bibasilar opacities, right greater than left. Mild cardiomegaly. IMPRESSION: Small bilateral pleural effusions and basilar opacities, likely atelectasis and/or pulmonary edema. Cardiomegaly. Electronically Signed   By: Ulyses Jarred M.D.   On: 12/05/2021 02:35   DG Chest Port 1 View  Result Date: 11/21/2021 CLINICAL DATA:  Chest pain, shortness of breath. EXAM: PORTABLE CHEST 1 VIEW COMPARISON:  September 29, 2020. FINDINGS: Stable cardiomediastinal silhouette. Mild bibasilar subsegmental atelectasis or edema is noted with probable small bilateral pleural effusions. The visualized  skeletal structures are unremarkable. IMPRESSION: Mild bibasilar subsegmental atelectasis or edema is noted with probable small bilateral pleural effusions. Electronically Signed   By: Marijo Conception M.D.   On: 11/21/2021 15:32   ECHOCARDIOGRAM COMPLETE  Result Date: 11/22/2021    ECHOCARDIOGRAM REPORT   Patient Name:   Nicolis Gullett Date of Exam: 11/22/2021 Medical Rec #:  [id number]  Height:       70.0 in Accession #:    [id number] Weight:       165.0 lb Date of Birth:  Aug 02, 1946   BSA:          1.923 m Patient Age:    1 years   BP:           118/58 mmHg Patient Gender: M          HR:           108 bpm. Exam Location:  [facility] Procedure: 2D Echo, Color Doppler and Cardiac Doppler Indications:     R94.31 Abnormal ECG  History:  Patient has prior history of Echocardiogram examinations. CAD,                  Pacemaker, Signs/Symptoms:Shortness of Breath and Fatigue; Risk                  Factors:Hypertension.  Sonographer:     Charmayne Sheer Referring Phys:  [id number] [facility] DAHAL Diagnosing Phys: Donnelly Angelica  Sonographer Comments: Suboptimal parasternal window. IMPRESSIONS  1. Left ventricular ejection fraction, by estimation, is 20 to 25%. The left ventricle has severely decreased function. The left ventricle demonstrates global hypokinesis. The left ventricular internal cavity size was mildly dilated. Left ventricular diastolic parameters are indeterminate.  2. Right ventricular systolic function is mildly reduced. The right ventricular size is mildly enlarged.  3. Left atrial size was mildly dilated.  4. Right atrial size was moderately dilated.  5. The mitral valve is normal in structure. No evidence of mitral valve regurgitation. No evidence of mitral stenosis.  6. The aortic valve is normal in structure. Aortic valve regurgitation is not visualized. No aortic stenosis is present.  7. The inferior vena cava is dilated in size with <50% respiratory variability, suggesting right atrial pressure of 15 mmHg. FINDINGS   Left Ventricle: Left ventricular ejection fraction, by estimation, is 20 to 25%. The left ventricle has severely decreased function. The left ventricle demonstrates global hypokinesis. The left ventricular internal cavity size was mildly dilated. There is no left ventricular hypertrophy. Left ventricular diastolic parameters are indeterminate. Right Ventricle: The right ventricular size is mildly enlarged. No increase in right ventricular wall thickness. Right ventricular systolic function is mildly reduced. Left Atrium: Left atrial size was mildly dilated. Right Atrium: Right atrial size was moderately dilated. Pericardium: There is no evidence of pericardial effusion. Mitral Valve: The mitral valve is normal in structure. No evidence of mitral valve regurgitation. No evidence of mitral valve stenosis. MV peak gradient, 5.1 mmHg. The mean mitral valve gradient is 2.0 mmHg. Tricuspid Valve: The tricuspid valve is normal in structure. Tricuspid valve regurgitation is trivial. Aortic Valve: The aortic valve is normal in structure. Aortic valve regurgitation is not visualized. No aortic stenosis is present. Aortic valve mean gradient measures 2.0 mmHg. Aortic valve peak gradient measures 3.6 mmHg. Aortic valve area, by VTI measures 2.82 cm. Pulmonic Valve: The pulmonic valve was not well visualized. Aorta: The aortic root is normal in size and structure. Venous: The inferior vena cava is dilated in size with less than 50% respiratory variability, suggesting right atrial pressure of 15 mmHg. IAS/Shunts: The interatrial septum was not well visualized.  LEFT VENTRICLE PLAX 2D LVIDd:         5.63 cm   Diastology LVIDs:         4.33 cm   LV e' lateral:   5.22 cm/s LV PW:         1.15 cm   LV E/e' lateral: 18.0 LV IVS:        0.80 cm LVOT diam:     2.40 cm LV SV:         36 LV SV Index:   19 LVOT Area:     4.52 cm  RIGHT VENTRICLE RV Basal diam:  4.35 cm LEFT ATRIUM             Index        RIGHT ATRIUM           Index LA  diam:        4.10 cm  2.13 cm/m   RA Area:     26.30 cm LA Vol (A2C):   60.5 ml 31.45 ml/m  RA Volume:   94.70 ml  49.23 ml/m LA Vol (A4C):   59.6 ml 30.99 ml/m LA Biplane Vol: 60.3 ml 31.35 ml/m  AORTIC VALVE                    PULMONIC VALVE AV Area (Vmax):    2.59 cm     PV Vmax:       0.59 m/s AV Area (Vmean):   2.70 cm     PV Vmean:      38.100 cm/s AV Area (VTI):     2.82 cm     PV VTI:        0.090 m AV Vmax:           94.40 cm/s   PV Peak grad:  1.4 mmHg AV Vmean:          62.400 cm/s  PV Mean grad:  1.0 mmHg AV VTI:            0.129 m AV Peak Grad:      3.6 mmHg AV Mean Grad:      2.0 mmHg LVOT Vmax:         54.10 cm/s LVOT Vmean:        37.200 cm/s LVOT VTI:          0.080 m LVOT/AV VTI ratio: 0.62  AORTA Ao Root diam: 3.50 cm MITRAL VALVE MV Area (PHT): 5.13 cm    SHUNTS MV Area VTI:   1.49 cm    Systemic VTI:  0.08 m MV Peak grad:  5.1 mmHg    Systemic Diam: 2.40 cm MV Mean grad:  2.0 mmHg MV Vmax:       1.13 m/s MV Vmean:      68.2 cm/s MV Decel Time: 148 msec MV E velocity: 94.05 cm/s Donnelly Angelica Electronically signed by Donnelly Angelica Signature Date/Time: 11/22/2021/1:01:37 PM    Final     Microbiology: Results for orders placed or performed during the hospital encounter of 11/21/21  Resp Panel by RT-PCR (Flu A&B, Covid) Nasopharyngeal Swab     Status: None   Collection Time: 11/21/21  3:26 PM   Specimen: Nasopharyngeal Swab; Nasopharyngeal(NP) swabs in vial transport medium  Result Value Ref Range Status   SARS Coronavirus 2 by RT PCR NEGATIVE NEGATIVE Final    Comment: (NOTE) SARS-CoV-2 target nucleic acids are NOT DETECTED.  The SARS-CoV-2 RNA is generally detectable in upper respiratory specimens during the acute phase of infection. The lowest concentration of SARS-CoV-2 viral copies this assay can detect is 138 copies/mL. A negative result does not preclude SARS-Cov-2 infection and should not be used as the sole basis for treatment or other patient management decisions. A  negative result may occur with  improper specimen collection/handling, submission of specimen other than nasopharyngeal swab, presence of viral mutation(s) within the areas targeted by this assay, and inadequate number of viral copies(<138 copies/mL). A negative result must be combined with clinical observations, patient history, and epidemiological information. The expected result is Negative.  Fact Sheet for Patients:  EntrepreneurPulse.com.au  Fact Sheet for Healthcare Providers:  IncredibleEmployment.be  This test is no t yet approved or cleared by the [place] FDA and  has been authorized for detection and/or diagnosis of SARS-CoV-2 by FDA under an Emergency Use Authorization (EUA). This EUA will remain  in effect (meaning this test  can be used) for the duration of the COVID-19 declaration under Section 564(b)(1) of the Act, 21 U.S.C.section 360bbb-3(b)(1), unless the authorization is terminated  or revoked sooner.       Influenza A by PCR NEGATIVE NEGATIVE Final   Influenza B by PCR NEGATIVE NEGATIVE Final    Comment: (NOTE) The Xpert Xpress SARS-CoV-2/FLU/RSV plus assay is intended as an aid in the diagnosis of influenza from Nasopharyngeal swab specimens and should not be used as a sole basis for treatment. Nasal washings and aspirates are unacceptable for Xpert Xpress SARS-CoV-2/FLU/RSV testing.  Fact Sheet for Patients: EntrepreneurPulse.com.au  Fact Sheet for Healthcare Providers: IncredibleEmployment.be  This test is not yet approved or cleared by the [place] FDA and has been authorized for detection and/or diagnosis of SARS-CoV-2 by FDA under an Emergency Use Authorization (EUA). This EUA will remain in effect (meaning this test can be used) for the duration of the COVID-19 declaration under Section 564(b)(1) of the Act, 21 U.S.C. section 360bbb-3(b)(1), unless the authorization  is terminated or revoked.  Performed at [facility], [address] [place], [place] [address]     Labs: CBC: Recent Labs  Lab 12/05/21 0226 12/06/21 0606 12/07/21 0455  WBC 7.7 8.7 9.7  NEUTROABS 4.9  --   --   HGB 14.4 13.5 14.1  HCT 43.3 40.3 42.2  MCV 106.4* 102.0* 102.4*  PLT 327 308 150   Basic Metabolic Panel: Recent Labs  Lab 12/05/21 0226 12/05/21 0500 12/06/21 0606 12/07/21 0455  NA 137  --  139 142  K 3.9  --  3.3* 3.8  CL 110  --  106 111  CO2 19*  --  24 23  GLUCOSE 118*  --  90 90  BUN 28*  --  18 17  CREATININE 1.11  --  0.99 1.10  CALCIUM 8.7*  --  8.4* 8.4*  MG  --  2.0 1.8 2.1   Liver Function Tests: No results for input(s): AST, ALT, ALKPHOS, BILITOT, PROT, ALBUMIN in the last 168 hours. CBG: No results for input(s): GLUCAP in the last 168 hours.  Discharge time spent: greater than 30 minutes.  Signed: Max Sane, MD [facility] 12/07/2021

## 2021-12-07 NOTE — Progress Notes (Unsigned)
{  Select_TRH_Note:[id number]} 

## 2021-12-07 NOTE — Progress Notes (Signed)
Patient left 2A alert with no distress noted by wheelchair with volunteer and his wife at side. Wife driving patient home.

## 2021-12-07 NOTE — Progress Notes (Signed)
Discharge instructions given to and reviewed with patient and his wife. Patient and wife verbalized understanding of all discharge instructions including follow up appointments, new prescriptions and changes to home medication list.

## 2021-12-14 ENCOUNTER — Other Ambulatory Visit: Payer: Self-pay | Admitting: Physician Assistant

## 2021-12-14 DIAGNOSIS — I480 Paroxysmal atrial fibrillation: Secondary | ICD-10-CM

## 2021-12-14 DIAGNOSIS — I5021 Acute systolic (congestive) heart failure: Secondary | ICD-10-CM

## 2021-12-15 ENCOUNTER — Ambulatory Visit: Payer: PRIVATE HEALTH INSURANCE | Admitting: Family

## 2021-12-19 ENCOUNTER — Encounter: Payer: Self-pay | Admitting: Family

## 2021-12-19 ENCOUNTER — Ambulatory Visit: Payer: Medicare Other | Attending: Family | Admitting: Family

## 2021-12-19 ENCOUNTER — Other Ambulatory Visit: Payer: Self-pay

## 2021-12-19 VITALS — BP 124/94 | HR 105 | Resp 16 | Ht 70.0 in | Wt 136.4 lb

## 2021-12-19 DIAGNOSIS — F1010 Alcohol abuse, uncomplicated: Secondary | ICD-10-CM | POA: Insufficient documentation

## 2021-12-19 DIAGNOSIS — I5022 Chronic systolic (congestive) heart failure: Secondary | ICD-10-CM | POA: Diagnosis present

## 2021-12-19 DIAGNOSIS — Z7901 Long term (current) use of anticoagulants: Secondary | ICD-10-CM | POA: Insufficient documentation

## 2021-12-19 DIAGNOSIS — I482 Chronic atrial fibrillation, unspecified: Secondary | ICD-10-CM | POA: Insufficient documentation

## 2021-12-19 DIAGNOSIS — I495 Sick sinus syndrome: Secondary | ICD-10-CM | POA: Insufficient documentation

## 2021-12-19 DIAGNOSIS — I480 Paroxysmal atrial fibrillation: Secondary | ICD-10-CM | POA: Insufficient documentation

## 2021-12-19 DIAGNOSIS — K52832 Lymphocytic colitis: Secondary | ICD-10-CM | POA: Diagnosis not present

## 2021-12-19 DIAGNOSIS — Z950 Presence of cardiac pacemaker: Secondary | ICD-10-CM | POA: Insufficient documentation

## 2021-12-19 DIAGNOSIS — H9190 Unspecified hearing loss, unspecified ear: Secondary | ICD-10-CM | POA: Insufficient documentation

## 2021-12-19 DIAGNOSIS — Z79899 Other long term (current) drug therapy: Secondary | ICD-10-CM | POA: Diagnosis not present

## 2021-12-19 DIAGNOSIS — F418 Other specified anxiety disorders: Secondary | ICD-10-CM | POA: Insufficient documentation

## 2021-12-19 DIAGNOSIS — R450 Nervousness: Secondary | ICD-10-CM | POA: Insufficient documentation

## 2021-12-19 DIAGNOSIS — R079 Chest pain, unspecified: Secondary | ICD-10-CM | POA: Insufficient documentation

## 2021-12-19 NOTE — Progress Notes (Signed)
Name: Collin Knight.   DOB: 07/16/1946   MRN: [id number]  Collin Mattern. is a 76 y.o. male with medical history significant of sCHF with EF 20-25%, A fib on Couamdin, lymphocytic colitis, SSS (s/p of pacemaker), alcohol abuse, depression with anxiety, who presents with chest pain and SOB.   Patient was recently hospitalized from 1/31 - 11/25/21, and found to have new onset atrial fibrillation and new onset sCHF with EF of 20-25%.  Patient was discharged on metoprolol, amiodarone and Coumadin for atrial fibrillation.   Echo: 11/22/21 reviewed and showed Left ventricular ejection fraction, by estimation, is 20 to 25%. The left ventricle has severely decreased function. The left ventricle demonstrates global hypokinesis. Echo: 09/29/20 Left ventricular ejection fraction, by estimation, is 50 to 55%. The left ventricle has low normal function. The left ventricle has no regional wall motion abnormalities.  He has f/u with his cardiologist Margarito Courser PA with [facility]), has a coronary CTA on 12/21/21, and f/u appt with them on 12/26/21.  He presents today for a initial visit to the heart failure clinic with a chief complaint of improving fatigue since his discharge. He denies any SOB, CP, palpitations, orthopnea, dizziness, weight gain, pedal edema or abdominal edema.   He weighs daily at home and reports his weight rarely fluctuates either direction more than 1-2 lbs. He is very active and exercises daily.   Past Medical History:  Diagnosis Date   Anxiety    Chronic atrial fibrillation (HCC)    Chronic systolic CHF (congestive heart failure) (HCC)    Pacemaker    SSS (sick sinus syndrome) ([place])    Past Surgical History:  Procedure Laterality Date   COLONOSCOPY WITH PROPOFOL N/A 09/25/2021   Procedure: COLONOSCOPY WITH PROPOFOL;  Surgeon: [name], DO;  Location: [facility] ENDOSCOPY;  Service: Gastroenterology;  Laterality: N/A;   EYE SURGERY     cataract both eyes   PACEMAKER LEADLESS INSERTION N/A  09/30/2020   Procedure: PACEMAKER LEADLESS INSERTION;  Surgeon: Isaias Cowman, MD;  Location: [facility];  Service: Cardiovascular;  Laterality: N/A;   Family History  Problem Relation Age of Onset   Stroke Mother    Cancer - Lung Father    Social History   Tobacco Use   Smoking status: Never   Smokeless tobacco: Never  Substance Use Topics   Alcohol use: Yes    Alcohol/week: 21.0 standard drinks    Types: 21 Cans of beer per week    Comment: 3 beers a day   No Known Allergies Prior to Admission medications   Medication Sig Start Date End Date Taking? Authorizing Provider  ALPRAZolam Duanne Moron) 0.5 MG tablet Take 1 tablet (0.5 mg total) by mouth 3 (three) times daily. 11/25/21  Yes Manuella Ghazi, Pratik D, DO  amiodarone (PACERONE) 200 MG tablet Take 1 tablet (200 mg total) by mouth daily. 11/29/21 12/29/21 Yes Shah, Pratik D, DO  budesonide (ENTOCORT EC) 3 MG 24 hr capsule Take 6-9 mg by mouth daily. 10/30/21 10/30/22 Yes [provider]  furosemide (LASIX) 40 MG tablet Take 1 tablet (40 mg total) by mouth daily. 12/07/21 01/06/22 Yes Max Sane, MD  JANTOVEN 5 MG tablet Take 5 mg by mouth daily. 11/08/21  Yes [provider]  metoprolol succinate (TOPROL XL) 50 MG 24 hr tablet Take 1 tablet (50 mg total) by mouth daily. 12/07/21 01/06/22 Yes Max Sane, MD  Multiple Vitamin (MULTIVITAMIN WITH MINERALS) TABS tablet Take 1 tablet by mouth daily. 11/26/21 12/26/21 Yes Manuella Ghazi,  Pratik D, DO  thiamine 100 MG tablet Take 1 tablet (100 mg total) by mouth daily. Patient not taking: Reported on 12/19/2021 11/26/21 12/26/21  Heath Lark D, DO    Review of Systems  Constitutional:  Positive for malaise/fatigue (improving).  HENT:  Positive for hearing loss.   Eyes: Negative.   Respiratory:  Negative for cough and shortness of breath.   Cardiovascular:  Negative for chest pain, palpitations, orthopnea and leg swelling.  Gastrointestinal: Negative.   Genitourinary: Negative.    Musculoskeletal: Negative.   Skin: Negative.   Neurological:  Negative for dizziness and headaches.  Endo/Heme/Allergies: Negative.   Psychiatric/Behavioral:  Negative for depression. The patient is nervous/anxious. The patient does not have insomnia.     Physical Exam Vitals and nursing note reviewed. Exam conducted with a chaperone present (wife).  Constitutional:      General: He is not in acute distress.    Appearance: He is normal weight.  Cardiovascular:     Rate and Rhythm: Regular rhythm. Tachycardia present.  Pulmonary:     Breath sounds: Normal breath sounds.  Musculoskeletal:     Right lower leg: No edema.     Left lower leg: No edema.  Skin:    General: Skin is warm and dry.  Neurological:     Mental Status: He is alert and oriented to person, place, and time.  Psychiatric:        Mood and Affect: Mood normal.        Thought Content: Thought content normal.    Lab Results  Component Value Date   CREATININE 1.10 12/07/2021   CREATININE 0.99 12/06/2021   CREATININE 1.11 12/05/2021    Vitals with BMI 12/19/2021 12/07/2021 12/07/2021  Height 5\' 10"  - -  Weight 136 lbs 7 oz - 138 lbs 14 oz  BMI 03.70 - 48.88  Systolic 916 945 -  Diastolic 94 79 -  Pulse 038 63 -    Filed Weights   12/19/21 1133  Weight: 136 lb 7 oz (61.9 kg)   Assessment & Plan:  Heart failure with reduced EF - NYHA II - euvolemic - saw cardiology Margarito Courser) 12/13/19 - coronary CTA planned for 12/21/21 - already weighing daily; reminded to call for an overnight weight gain of > 2 pounds or a weekly weight gain of > 5 pounds - adhering to a low sodium diet - remains very active - reiterated fluid restriction  - on metoprolol succinate - BNP 12/05/21 was 1766.5  2. PAF - regular rhythm today - amiodarone and coumadin - saw cardiology Margarito Courser) 12/12/21 - saw PCP Kary Kos) 12/14/21 - BMP 12/07/21 reviewed and showed sodium 142, potassium 3.8, creatinine 1.1 & GFR >60   Medication bottles  reviewed.  Due to HF stability, will not schedule a f/u appt. Explained he can call or return at any time in the future.

## 2021-12-19 NOTE — Patient Instructions (Signed)
Continue to weigh and watch your sodium intake.  Call the heart failure clinic as needed.

## 2021-12-20 ENCOUNTER — Telehealth (HOSPITAL_COMMUNITY): Payer: Self-pay | Admitting: Emergency Medicine

## 2021-12-20 NOTE — Telephone Encounter (Signed)
Reaching out to patient to offer assistance regarding upcoming cardiac imaging study; pt verbalizes understanding of appt date/time, parking situation and where to check in, pre-test NPO status and medications ordered, and verified current allergies; name and call back number provided for further questions should they arise ?Marchia Bond RN Navigator Cardiac Imaging ?[facility] ?[phone] office ?[phone] cell ? ?Denies iv issues  ?Daily meds ?Arrival 215 ? ?

## 2021-12-21 ENCOUNTER — Other Ambulatory Visit: Payer: Self-pay

## 2021-12-21 ENCOUNTER — Ambulatory Visit
Admission: RE | Admit: 2021-12-21 | Discharge: 2021-12-21 | Disposition: A | Discharge status: Home / Self Care | Payer: Medicare Other | Source: Ambulatory Visit | Attending: Physician Assistant | Admitting: Physician Assistant

## 2021-12-21 ENCOUNTER — Other Ambulatory Visit (HOSPITAL_COMMUNITY): Payer: Self-pay | Admitting: Emergency Medicine

## 2021-12-21 DIAGNOSIS — R079 Chest pain, unspecified: Secondary | ICD-10-CM

## 2021-12-21 DIAGNOSIS — I480 Paroxysmal atrial fibrillation: Secondary | ICD-10-CM

## 2021-12-21 DIAGNOSIS — I5021 Acute systolic (congestive) heart failure: Secondary | ICD-10-CM

## 2021-12-21 DIAGNOSIS — I5022 Chronic systolic (congestive) heart failure: Secondary | ICD-10-CM

## 2021-12-21 MED ORDER — METOPROLOL TARTRATE 100 MG PO TABS: 100.0000 mg | ORAL_TABLET | Freq: Once | ORAL | 0 refills | Status: DC

## 2021-12-21 NOTE — Progress Notes (Signed)
Upon arrival for Cardiac CT patient's heart rate range 104-111 ST. Patient took all of his daily medications. Spoke with Dr Garen Lah who ordered patient to be rescheduled and to add a one time dose of Metoprolol Tartrate 100 mg PO and to have patient take it 2 hours prior to Cardiac CT. Patient verbalized understanding.  ?

## 2021-12-22 ENCOUNTER — Telehealth (HOSPITAL_COMMUNITY): Payer: Self-pay | Admitting: Emergency Medicine

## 2021-12-22 NOTE — Telephone Encounter (Signed)
Attempted to call patient regarding upcoming cardiac CT appointment. ?Left message on voicemail with name and callback number ?Marchia Bond RN Navigator Cardiac Imaging ?[facility] ?[phone] Office ?[phone] Cell ? ?Reminded to pick up 100mg  metoprolol tartrate for scan ?

## 2021-12-25 ENCOUNTER — Other Ambulatory Visit: Payer: Self-pay

## 2021-12-25 ENCOUNTER — Ambulatory Visit
Admission: RE | Admit: 2021-12-25 | Discharge: 2021-12-25 | Disposition: A | Discharge status: Home / Self Care | Payer: Medicare Other | Source: Ambulatory Visit | Attending: Physician Assistant | Admitting: Physician Assistant

## 2021-12-25 ENCOUNTER — Other Ambulatory Visit: Payer: Self-pay | Admitting: Physician Assistant

## 2021-12-25 DIAGNOSIS — I480 Paroxysmal atrial fibrillation: Secondary | ICD-10-CM | POA: Insufficient documentation

## 2021-12-25 DIAGNOSIS — I2510 Atherosclerotic heart disease of native coronary artery without angina pectoris: Secondary | ICD-10-CM

## 2021-12-25 DIAGNOSIS — R931 Abnormal findings on diagnostic imaging of heart and coronary circulation: Secondary | ICD-10-CM | POA: Insufficient documentation

## 2021-12-25 DIAGNOSIS — I5021 Acute systolic (congestive) heart failure: Secondary | ICD-10-CM | POA: Diagnosis present

## 2021-12-25 HISTORY — DX: Atherosclerotic heart disease of native coronary artery without angina pectoris: I25.10

## 2021-12-25 MED ORDER — NITROGLYCERIN 0.4 MG SL SUBL
0.4000 mg | SUBLINGUAL_TABLET | Freq: Once | SUBLINGUAL | Status: AC
  Administered 2021-12-25: 0.4 mg via SUBLINGUAL

## 2021-12-25 MED ORDER — IOHEXOL 350 MG/ML SOLN
75.0000 mL | Freq: Once | INTRAVENOUS | Status: AC | PRN
  Administered 2021-12-25: 75 mL via INTRAVENOUS

## 2021-12-25 MED ORDER — NITROGLYCERIN 0.4 MG SL SUBL: 0.4000 mg | SUBLINGUAL_TABLET | SUBLINGUAL | Status: DC | PRN

## 2021-12-25 NOTE — Progress Notes (Signed)
Patient tolerated procedure well. Ambulate w/o difficulty. Denies any lightheadedness or being dizzy. Pt denies any pain at this time. Sitting in chair, drinking water provided. P is encouraged to drink additional water throughout the day and reason explained to patient. Patient verbalized understanding and all questions answered. ABC intact. No further needs at this time. Discharge from procedure area w/o issues.  °

## 2021-12-26 DIAGNOSIS — R931 Abnormal findings on diagnostic imaging of heart and coronary circulation: Secondary | ICD-10-CM | POA: Diagnosis not present

## 2022-04-24 ENCOUNTER — Emergency Department
Admission: EM | Admit: 2022-04-24 | Discharge: 2022-04-24 | Disposition: A | Discharge status: Home / Self Care | Payer: Medicare Other | Attending: Emergency Medicine | Admitting: Emergency Medicine

## 2022-04-24 ENCOUNTER — Encounter: Payer: Self-pay | Admitting: Emergency Medicine

## 2022-04-24 ENCOUNTER — Other Ambulatory Visit: Payer: Self-pay

## 2022-04-24 DIAGNOSIS — D649 Anemia, unspecified: Secondary | ICD-10-CM | POA: Diagnosis not present

## 2022-04-24 DIAGNOSIS — T148XXA Other injury of unspecified body region, initial encounter: Secondary | ICD-10-CM

## 2022-04-24 DIAGNOSIS — R791 Abnormal coagulation profile: Secondary | ICD-10-CM | POA: Insufficient documentation

## 2022-04-24 DIAGNOSIS — X58XXXA Exposure to other specified factors, initial encounter: Secondary | ICD-10-CM | POA: Diagnosis not present

## 2022-04-24 DIAGNOSIS — S51812A Laceration without foreign body of left forearm, initial encounter: Secondary | ICD-10-CM | POA: Diagnosis not present

## 2022-04-24 DIAGNOSIS — Z7901 Long term (current) use of anticoagulants: Secondary | ICD-10-CM | POA: Insufficient documentation

## 2022-04-24 DIAGNOSIS — S59912A Unspecified injury of left forearm, initial encounter: Secondary | ICD-10-CM | POA: Diagnosis present

## 2022-04-24 LAB — CBC
HCT: 34.4 % — ABNORMAL LOW (ref 39.0–52.0)
Hemoglobin: 11.2 g/dL — ABNORMAL LOW (ref 13.0–17.0)
MCH: 36.7 pg — ABNORMAL HIGH (ref 26.0–34.0)
MCHC: 32.6 g/dL (ref 30.0–36.0)
MCV: 112.8 fL — ABNORMAL HIGH (ref 80.0–100.0)
Platelets: 268 10*3/uL (ref 150–400)
RBC: 3.05 MIL/uL — ABNORMAL LOW (ref 4.22–5.81)
RDW: 13.6 % (ref 11.5–15.5)
WBC: 7.8 10*3/uL (ref 4.0–10.5)
nRBC: 0.3 % — ABNORMAL HIGH (ref 0.0–0.2)

## 2022-04-24 LAB — BASIC METABOLIC PANEL
Anion gap: 8 (ref 5–15)
BUN: 14 mg/dL (ref 8–23)
CO2: 22 mmol/L (ref 22–32)
Calcium: 8.4 mg/dL — ABNORMAL LOW (ref 8.9–10.3)
Chloride: 109 mmol/L (ref 98–111)
Creatinine, Ser: 0.91 mg/dL (ref 0.61–1.24)
GFR, Estimated: >60 mL/min (ref >60)
Glucose, Bld: 96 mg/dL (ref 70–99)
Potassium: 3.7 mmol/L (ref 3.5–5.1)
Sodium: 139 mmol/L (ref 135–145)

## 2022-04-24 LAB — PROTIME-INR
INR: 2.5 — ABNORMAL HIGH (ref 0.8–1.2)
Prothrombin Time: 27.1 seconds — ABNORMAL HIGH (ref 11.4–15.2)

## 2022-04-24 NOTE — Discharge Instructions (Signed)
Your INR today is 2.5 which is a normal level for your condition.  You should resume taking Coumadin at 4 mg daily for the next several days.  Follow-up with your regular doctor on Thursday or Friday to have an INR recheck.  Return to the ER immediately for new, worsening, or persistent bruising, swelling, abnormal cuts or bleeding, blood in the stool or urine, bleeding gums, any other abnormal bleeding or bruising, or any other new or worsening symptoms that concern you.

## 2022-04-24 NOTE — ED Triage Notes (Signed)
Pt here with a small laceration to the left forearm that he does not remember happening. Pt was on a blood thinner, Warfarin, stopped taking it Friday. Pt now has a large discoloration to the left forearm that he states is a little painful.

## 2022-04-24 NOTE — ED Notes (Signed)
First Nurse Note: Pt to ED via POV, pt states that he has been having episodes of bruising. Pt states that he had an area come up on his left arm on Friday and the bruise extended down his forearm. Pt states that he stopped taking his Coumadin on Friday due to the bleeding. Last had PT/INR checked about 3 weeks ago.

## 2022-04-24 NOTE — ED Provider Notes (Signed)
[facility] Provider Note    Event Date/Time   First MD Initiated Contact with Patient 04/24/22 1127     (approximate)   History   Laceration   HPI  [facility] Collin Knight. is a 76 y.o. male with a history of atrial fibrillation on Coumadin who presents with abnormal bruising to the left forearm for the last 4 days, not associated with any trauma.  The patient has a small skin tear to the area as well and had a few other small areas of bruising and bleeding.  In addition he noted that 4 days ago he started having some blood from a hemorrhoid as well as blood in his urine.  These resolved the next day.  He stopped taking his Coumadin 3 days ago thinking the level might be too high.  He states that the bruising on the left forearm is improving and he has no pain there.  He denies any other abnormal bleeding or bruising currently.    Physical Exam   Triage Vital Signs: ED Triage Vitals  Enc Vitals Group     BP 04/24/22 1106 (!) 150/94     Pulse Rate 04/24/22 1106 87     Resp 04/24/22 1106 18     Temp 04/24/22 1106 98.4 F (36.9 C)     Temp Source 04/24/22 1106 Oral     SpO2 04/24/22 1106 95 %     Weight 04/24/22 1107 136 lb 7.4 oz (61.9 kg)     Height 04/24/22 1107 '5\' 10"'$  (1.778 m)     Head Circumference --      Peak Flow --      Pain Score 04/24/22 1107 2     Pain Loc --      Pain Edu? --      Excl. in [place]? --     Most recent vital signs: Vitals:   04/24/22 1106  BP: (!) 150/94  Pulse: 87  Resp: 18  Temp: 98.4 F (36.9 C)  SpO2: 95%     General: Alert, well-appearing. CV:  Good peripheral perfusion.  Resp:  Normal effort.  Abd:  No distention.  Other:  Ecchymosis to medial left forearm.  Nontender.  1 cm superficial skin tear to the distal left forearm healing well.   ED Results / Procedures / Treatments   Labs (all labs ordered are listed, but only abnormal results are displayed) Labs Reviewed  CBC - Abnormal; Notable for the  following components:      Result Value   RBC 3.05 (*)    Hemoglobin 11.2 (*)    HCT 34.4 (*)    MCV 112.8 (*)    MCH 36.7 (*)    nRBC 0.3 (*)    All other components within normal limits  BASIC METABOLIC PANEL - Abnormal; Notable for the following components:   Calcium 8.4 (*)    All other components within normal limits  PROTIME-INR - Abnormal; Notable for the following components:   Prothrombin Time 27.1 (*)    INR 2.5 (*)    All other components within normal limits     EKG     RADIOLOGY    PROCEDURES:  Critical Care performed: No  Procedures   MEDICATIONS ORDERED IN ED: Medications - No data to display   IMPRESSION / MDM / [place] / ED COURSE  I reviewed the triage vital signs and the nursing notes.  76 year old male with PMH as noted above presents with abnormal  bruising to his left forearm which is resolving, a few small skin tears, as well as hematuria and blood in the stool that started at the same time but resolved 3 days ago.  He had no trauma.  He stopped taking his Coumadin the day the symptoms began.  Exam is as described above.  Lab work-up reveals a an INR of 2.5 and a hemoglobin of 11.  Differential diagnosis includes, but is not limited to, subcutaneous hemorrhage, likely from supratherapeutic INR.  Given that this is now the fourth day that the patient has not taken his Coumadin and his INR is 2.5, I suspect that he was significantly supratherapeutic when the symptoms began.  He states this is happened before and his dose has changed a few times.  He is currently prescribed 5 mg daily.  Patient's presentation is most consistent with acute presentation with potential threat to life or bodily function.  At this time given that the bruising is receding, he has no other abnormal bleeding, and the INR is therapeutic, there is no indication for vitamin K or other acute intervention.  I recommend that the patient resume at a lower dose of 4 mg  (he was on this dose previously and has milligram tablets at home) and have his INR rechecked at his PMDs office in a few days.  At this time, the patient is stable for discharge home.  I counseled him on the results of the labs and the plan of care and he is in agreement.  Return precautions given, and he expresses understanding.    FINAL CLINICAL IMPRESSION(S) / ED DIAGNOSES   Final diagnoses:  Bruising  Supratherapeutic INR     Rx / DC Orders   ED Discharge Orders     None        Note:  This document was prepared using Dragon voice recognition software and may include unintentional dictation errors.    Arta Silence, MD 04/24/22 1143

## 2022-08-22 ENCOUNTER — Encounter: Payer: Self-pay | Admitting: Family

## 2022-08-22 ENCOUNTER — Ambulatory Visit: Payer: Medicare Other | Attending: Family | Admitting: Family

## 2022-08-22 VITALS — BP 132/89 | HR 60 | Resp 18 | Ht 70.0 in | Wt 153.5 lb

## 2022-08-22 DIAGNOSIS — I5022 Chronic systolic (congestive) heart failure: Secondary | ICD-10-CM

## 2022-08-22 DIAGNOSIS — Z7901 Long term (current) use of anticoagulants: Secondary | ICD-10-CM | POA: Insufficient documentation

## 2022-08-22 DIAGNOSIS — F419 Anxiety disorder, unspecified: Secondary | ICD-10-CM | POA: Diagnosis not present

## 2022-08-22 DIAGNOSIS — Z79899 Other long term (current) drug therapy: Secondary | ICD-10-CM | POA: Insufficient documentation

## 2022-08-22 DIAGNOSIS — R5383 Other fatigue: Secondary | ICD-10-CM | POA: Diagnosis present

## 2022-08-22 DIAGNOSIS — I480 Paroxysmal atrial fibrillation: Secondary | ICD-10-CM

## 2022-08-22 DIAGNOSIS — R0602 Shortness of breath: Secondary | ICD-10-CM | POA: Insufficient documentation

## 2022-08-22 DIAGNOSIS — F32A Depression, unspecified: Secondary | ICD-10-CM | POA: Diagnosis not present

## 2022-08-22 MED ORDER — DAPAGLIFLOZIN PROPANEDIOL 10 MG PO TABS: 10.0000 mg | ORAL_TABLET | Freq: Every day | ORAL | 5 refills | Status: DC

## 2022-08-22 MED ORDER — DAPAGLIFLOZIN PROPANEDIOL 10 MG PO TABS: 10.0000 mg | ORAL_TABLET | Freq: Every day | ORAL | 3 refills | Status: DC

## 2022-08-22 NOTE — Progress Notes (Unsigned)
Name: Collin Knight.   DOB: 11/02/1945   MRN: [id number]  Collin Knight. is a 76 y.o. male with medical history significant of sCHF with EF 20-25%, A fib on Couamdin, lymphocytic colitis, SSS (s/p of pacemaker), alcohol abuse, depression with anxiety, who presents with chest pain and SOB.   Patient was recently hospitalized from 1/31 - 11/25/21, and found to have new onset atrial fibrillation and new onset sCHF with EF of 20-25%.  Patient was discharged on metoprolol, amiodarone and Coumadin for atrial fibrillation.   Echo: 11/22/21 reviewed and showed Left ventricular ejection fraction, by estimation, is 20 to 25%. The left ventricle has severely decreased function. The left ventricle demonstrates global hypokinesis. Echo: 09/29/20 Left ventricular ejection fraction, by estimation, is 50 to 55%. The left ventricle has low normal function. The left ventricle has no regional wall motion abnormalities.  He has f/u with his cardiologist Margarito Courser PA with [facility]), has a coronary CTA on 12/21/21, and f/u appt with them on 12/26/21.  He presents today for a initial visit to the heart failure clinic with a chief complaint of improving fatigue since his discharge. He denies any SOB, CP, palpitations, orthopnea, dizziness, weight gain, pedal edema or abdominal edema.   He weighs daily at home and reports his weight rarely fluctuates either direction more than 1-2 lbs. He is very active and exercises daily.   Past Medical History:  Diagnosis Date   Anxiety    Chronic atrial fibrillation (HCC)    Chronic systolic CHF (congestive heart failure) (HCC)    Pacemaker    SSS (sick sinus syndrome) ([place])    Past Surgical History:  Procedure Laterality Date   COLONOSCOPY WITH PROPOFOL N/A 09/25/2021   Procedure: COLONOSCOPY WITH PROPOFOL;  Surgeon: [name], DO;  Location: [facility] ENDOSCOPY;  Service: Gastroenterology;  Laterality: N/A;   EYE SURGERY     cataract both eyes   PACEMAKER LEADLESS INSERTION N/A  09/30/2020   Procedure: PACEMAKER LEADLESS INSERTION;  Surgeon: Isaias Cowman, MD;  Location: [place] CV LAB;  Service: Cardiovascular;  Laterality: N/A;   Family History  Problem Relation Age of Onset   Stroke Mother    Cancer - Lung Father    Social History   Tobacco Use   Smoking status: Never   Smokeless tobacco: Never  Substance Use Topics   Alcohol use: Yes    Alcohol/week: 21.0 standard drinks of alcohol    Types: 21 Cans of beer per week    Comment: 3 beers a day   No Known Allergies   Review of Systems  Constitutional:  Positive for malaise/fatigue (worsening).  HENT:  Positive for hearing loss.   Eyes: Negative.   Respiratory:  Positive for shortness of breath. Negative for cough.   Cardiovascular:  Positive for chest pain (occasionally) and palpitations (at times). Negative for orthopnea and leg swelling.  Gastrointestinal: Negative.   Genitourinary: Negative.   Musculoskeletal: Negative.   Skin: Negative.   Neurological:  Positive for dizziness (with sudden position changes). Negative for headaches.  Endo/Heme/Allergies: Negative.   Psychiatric/Behavioral:  Negative for depression. The patient is nervous/anxious. The patient does not have insomnia.      Physical Exam Vitals and nursing note reviewed. Exam conducted with a chaperone present (wife).  Constitutional:      General: He is not in acute distress.    Appearance: He is normal weight.  Cardiovascular:     Rate and Rhythm: Normal rate and regular rhythm.  Pulmonary:  Breath sounds: Normal breath sounds.  Musculoskeletal:     Right lower leg: No edema.     Left lower leg: No edema.  Skin:    General: Skin is warm and dry.  Neurological:     Mental Status: He is alert and oriented to person, place, and time.  Psychiatric:        Mood and Affect: Mood normal.        Thought Content: Thought content normal.       Assessment & Plan:  Heart failure with reduced EF - NYHA II -  euvolemic - saw cardiology Margarito Courser) 12/13/19 - coronary CTA planned for 12/21/21 - already weighing daily; reminded to call for an overnight weight gain of > 2 pounds or a weekly weight gain of > 5 pounds - adhering to a low sodium diet - remains very active - reiterated fluid restriction  - on metoprolol succinate - BNP 12/05/21 was 1766.5  2. PAF - regular rhythm today - amiodarone and coumadin - saw cardiology Margarito Courser) 12/12/21 - saw PCP Kary Kos) 12/14/21 - BMP 12/07/21 reviewed and showed sodium 142, potassium 3.8, creatinine 1.1 & GFR >60   Medication bottles reviewed.

## 2022-08-22 NOTE — Patient Instructions (Addendum)
Continue weighing daily and call for an overnight weight gain of 3 pounds or more or a weekly weight gain of more than 5 pounds.   If you have voicemail, please make sure your mailbox is cleaned out so that we may leave a message and please make sure to listen to any voicemails.    Start taking farxiga as 1 tablet daily

## 2022-08-23 ENCOUNTER — Encounter: Payer: Self-pay | Admitting: Family

## 2022-08-29 ENCOUNTER — Telehealth: Payer: Self-pay | Admitting: Family

## 2022-08-29 NOTE — Telephone Encounter (Signed)
Patient has been approved for [place] patient assistance and should arrive to him in the next 7 to 10 business days.   Elane Peabody, NT

## 2022-09-25 ENCOUNTER — Telehealth: Payer: Self-pay | Admitting: Family

## 2022-09-25 ENCOUNTER — Other Ambulatory Visit
Admission: RE | Admit: 2022-09-25 | Discharge: 2022-09-25 | Disposition: A | Discharge status: Home / Self Care | Payer: Medicare Other | Source: Ambulatory Visit | Attending: Family | Admitting: Family

## 2022-09-25 ENCOUNTER — Encounter: Payer: Self-pay | Admitting: Family

## 2022-09-25 ENCOUNTER — Ambulatory Visit (HOSPITAL_BASED_OUTPATIENT_CLINIC_OR_DEPARTMENT_OTHER): Payer: Medicare Other | Admitting: Family

## 2022-09-25 VITALS — BP 155/87 | HR 93 | Resp 18 | Wt 151.5 lb

## 2022-09-25 DIAGNOSIS — Z7984 Long term (current) use of oral hypoglycemic drugs: Secondary | ICD-10-CM | POA: Insufficient documentation

## 2022-09-25 DIAGNOSIS — M549 Dorsalgia, unspecified: Secondary | ICD-10-CM | POA: Insufficient documentation

## 2022-09-25 DIAGNOSIS — Z79899 Other long term (current) drug therapy: Secondary | ICD-10-CM | POA: Insufficient documentation

## 2022-09-25 DIAGNOSIS — R609 Edema, unspecified: Secondary | ICD-10-CM | POA: Insufficient documentation

## 2022-09-25 DIAGNOSIS — I480 Paroxysmal atrial fibrillation: Secondary | ICD-10-CM | POA: Insufficient documentation

## 2022-09-25 DIAGNOSIS — R5383 Other fatigue: Secondary | ICD-10-CM | POA: Insufficient documentation

## 2022-09-25 DIAGNOSIS — I5022 Chronic systolic (congestive) heart failure: Secondary | ICD-10-CM | POA: Insufficient documentation

## 2022-09-25 DIAGNOSIS — R0602 Shortness of breath: Secondary | ICD-10-CM | POA: Insufficient documentation

## 2022-09-25 LAB — BASIC METABOLIC PANEL
Anion gap: 9 (ref 5–15)
BUN: 18 mg/dL (ref 8–23)
CO2: 26 mmol/L (ref 22–32)
Calcium: 8.9 mg/dL (ref 8.9–10.3)
Chloride: 103 mmol/L (ref 98–111)
Creatinine, Ser: 1 mg/dL (ref 0.61–1.24)
GFR, Estimated: >60 mL/min (ref >60)
Glucose, Bld: 81 mg/dL (ref 70–99)
Potassium: 3.8 mmol/L (ref 3.5–5.1)
Sodium: 138 mmol/L (ref 135–145)

## 2022-09-25 MED ORDER — SACUBITRIL-VALSARTAN 24-26 MG PO TABS: 1.0000 | ORAL_TABLET | Freq: Two times a day (BID) | ORAL | 3 refills | Status: DC

## 2022-09-25 NOTE — Telephone Encounter (Signed)
Called and spoke to patient and spouse about lab results obtained earlier today. Advised patient to take an extra '20mg'$  lasix tomorrow since he has the pedal edema. He verbalized understanding.

## 2022-09-25 NOTE — Progress Notes (Signed)
Name: Collin Knight.   DOB: 07/16/1946   MRN: [id number]  Collin Knight. is a 76 y.o. male with medical history of A fib, lymphocytic colitis, SSS (s/p of pacemaker), alcohol abuse, depression with anxiety and chronic heart failure.    Echo: 11/22/21 reviewed and showed Left ventricular ejection fraction, by estimation, is 20 to 25%. The left ventricle has severely decreased function. The left ventricle demonstrates global hypokinesis. Echo: 09/29/20 Left ventricular ejection fraction, by estimation, is 50 to 55%. The left ventricle has low normal function. The left ventricle has no regional wall motion abnormalities.  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   Was in the ED 04/24/22 due to abnormal bruising. Evaluated and released.   He presents today for a follow-up visit with a chief complaint of moderate fatigue with minimal exertion. Describes this as chronic in nature. Has associated pedal edema (recently), palpitations, shortness of breath, dizziness, anxiety and back pain (due to recent fall) along with this. Denies any cough, PND, chest pain or weight gain.   Says that he feels better since starting farxiga and doesn't have as much cough/ SOB.   Did have a fall ~ 1 week ago and didn't take his lasix for about a week because he was in so much pain that he couldn't make it to the bathroom in time when he took the lasix. Has since resumed taking it. Also has been drinking 36-48 ounces of beer over the last few days.   Past Medical History:  Diagnosis Date   Anxiety    Chronic atrial fibrillation (HCC)    Chronic systolic CHF (congestive heart failure) (HCC)    Pacemaker    SSS (sick sinus syndrome) ([place])    Past Surgical History:  Procedure Laterality Date   COLONOSCOPY WITH PROPOFOL N/A 09/25/2021   Procedure: COLONOSCOPY WITH PROPOFOL;   Surgeon: [name], DO;  Location: [facility] ENDOSCOPY;  Service: Gastroenterology;  Laterality: N/A;   EYE SURGERY     cataract both eyes   PACEMAKER LEADLESS INSERTION N/A 09/30/2020   Procedure: PACEMAKER LEADLESS INSERTION;  Surgeon: Isaias Cowman, MD;  Location: [place] CV LAB;  Service: Cardiovascular;  Laterality: N/A;   Family History  Problem Relation Age of Onset   Stroke Mother    Cancer - Lung Father    Social History   Tobacco Use   Smoking status: Never   Smokeless tobacco: Never  Substance Use Topics   Alcohol use: Yes    Alcohol/week: 21.0 standard drinks of alcohol    Types: 21 Cans of beer per week    Comment: 3 beers a day   No Known Allergies  Prior to Admission medications   Medication Sig Start Date End Date Taking? Authorizing Provider  amiodarone (PACERONE) 200 MG tablet Take 1 tablet (200 mg total) by mouth daily. 11/29/21  Yes Manuella Ghazi, Pratik D, DO  dapagliflozin propanediol (FARXIGA) 10 MG TABS tablet Take 1 tablet (10 mg total) by mouth daily before breakfast. 08/22/22  Yes Darylene Price A, FNP  furosemide (LASIX) 40 MG tablet Take 1 tablet (40 mg total) by mouth daily. 12/07/21  Yes Max Sane, MD  metoprolol succinate (TOPROL XL) 50 MG 24 hr tablet Take 1 tablet (50 mg total) by mouth daily. 12/07/21  Yes Max Sane, MD  warfarin (COUMADIN) 3 MG tablet Take 3 mg by mouth daily.   Yes  [provider]    Review of Systems  Constitutional:  Positive for malaise/fatigue (easily).  HENT:  Positive for hearing loss.   Eyes: Negative.   Respiratory:  Positive for shortness of breath (very little). Negative for cough.   Cardiovascular:  Positive for palpitations (at times) and leg swelling (around ankles last few days). Negative for chest pain and orthopnea.  Gastrointestinal: Negative.   Genitourinary: Negative.   Musculoskeletal:  Positive for back pain (lower back after fall).  Skin: Negative.   Neurological:  Positive for  dizziness (with sudden position changes). Negative for headaches.  Endo/Heme/Allergies: Negative.   Psychiatric/Behavioral:  Negative for depression. The patient is nervous/anxious. The patient does not have insomnia.      Vitals:   09/25/22 1324  BP: (!) 155/87  Pulse: 93  Resp: 18  SpO2: 98%  Weight: 151 lb 8 oz (68.7 kg)   Wt Readings from Last 3 Encounters:  09/25/22 151 lb 8 oz (68.7 kg)  08/22/22 153 lb 8 oz (69.6 kg)  04/24/22 136 lb 7.4 oz (61.9 kg)   Lab Results  Component Value Date   CREATININE 0.91 04/24/2022   CREATININE 1.10 12/07/2021   CREATININE 0.99 12/06/2021   Physical Exam Vitals and nursing note reviewed. Exam conducted with a chaperone present (wife).  Constitutional:      General: He is not in acute distress.    Appearance: He is normal weight.  HENT:     Head: Normocephalic and atraumatic.  Neck:     Vascular: No JVD.  Cardiovascular:     Rate and Rhythm: Normal rate. Rhythm irregular.  Pulmonary:     Effort: Pulmonary effort is normal.     Breath sounds: No wheezing, rhonchi or rales.  Abdominal:     Palpations: Abdomen is soft.     Tenderness: There is no abdominal tenderness.  Musculoskeletal:        General: No tenderness.     Cervical back: Normal range of motion and neck supple.     Right lower leg: No tenderness. Edema (1+ pitting) present.     Left lower leg: No tenderness. Edema (1+ pitting) present.  Skin:    General: Skin is warm and dry.  Neurological:     General: No focal deficit present.     Mental Status: He is alert and oriented to person, place, and time.  Psychiatric:        Mood and Affect: Mood normal.        Behavior: Behavior normal.        Thought Content: Thought content normal.     Assessment & Plan:  Chronic Heart failure with reduced EF- - NYHA III - euvolemic today - weighing daily; reminded to call for an overnight weight gain of > 2 pounds or a weekly weight gain of > 5 pounds - weight down 2 pounds   from last visit here 1 month ago - on GDMT of metoprolol succinate & farxiga - will check BMP today as farxiga started at last visit; has gotten approved for patient assistance and has received his first shipment - depending on lab results, will call patient and have him take an extra diuretic due to edema - will add entresto 24/'26mg'$  BID; voucher provided and patient assistance forms filled out - check BMP next visit - not adding salt to his food but he has been drinking more beer lately; reviewed sodium content and that this is part of his fluid intake and any alcohol  use should be sparingly - remains very active although hasn't been to the gym since he fell due to his back pain - will make referral to Dr. Haroldine Laws - reiterated fluid restriction  - BNP 12/05/21 was 1766.5  2. PAF - HR (I) - on amiodarone and coumadin; INR on 08/27/22 was 2.1 - saw cardiology ([facility]) 08/30/22 - status post Micra AV 09/30/2020  - saw PCP Kary Kos) 12/14/21 - BMP 04/24/22 reviewed and showed sodium 139, potassium 3.7, creatinine 0.91 & GFR >60   Medication list reviewed.   Return in 3 weeks, sooner if needed.

## 2022-09-25 NOTE — Patient Instructions (Addendum)
Continue weighing daily and call for an overnight weight gain of 3 pounds or more or a weekly weight gain of more than 5 pounds.   If you have voicemail, please make sure your mailbox is cleaned out so that we may leave a message and please make sure to listen to any voicemails.    Begin entresto as 1 tablet in the morning and 1 tablet in the evening

## 2022-09-28 ENCOUNTER — Other Ambulatory Visit: Payer: Self-pay | Admitting: Family

## 2022-09-28 MED ORDER — SACUBITRIL-VALSARTAN 24-26 MG PO TABS: 1.0000 | ORAL_TABLET | Freq: Two times a day (BID) | ORAL | 3 refills | Status: DC

## 2022-09-28 NOTE — Progress Notes (Signed)
Printed entresto RX for patient assistance

## 2022-10-05 ENCOUNTER — Other Ambulatory Visit (HOSPITAL_COMMUNITY): Payer: Self-pay

## 2022-10-08 ENCOUNTER — Other Ambulatory Visit: Payer: Medicare Other

## 2022-10-08 ENCOUNTER — Ambulatory Visit (HOSPITAL_BASED_OUTPATIENT_CLINIC_OR_DEPARTMENT_OTHER): Payer: Medicare Other | Admitting: Internal Medicine

## 2022-10-08 ENCOUNTER — Other Ambulatory Visit
Admission: RE | Admit: 2022-10-08 | Discharge: 2022-10-08 | Disposition: A | Discharge status: Home / Self Care | Payer: Medicare Other | Source: Ambulatory Visit | Attending: Internal Medicine | Admitting: Internal Medicine

## 2022-10-08 ENCOUNTER — Inpatient Hospital Stay (HOSPITAL_COMMUNITY)
Admission: RE | Admit: 2022-10-08 | Discharge: 2022-10-08 | Disposition: A | Discharge status: Home / Self Care | Payer: Medicare Other | Source: Ambulatory Visit

## 2022-10-08 ENCOUNTER — Encounter: Payer: Self-pay | Admitting: Internal Medicine

## 2022-10-08 ENCOUNTER — Other Ambulatory Visit: Payer: Self-pay | Admitting: Internal Medicine

## 2022-10-08 VITALS — BP 130/84 | HR 50 | Ht 70.0 in | Wt 155.0 lb

## 2022-10-08 DIAGNOSIS — F1010 Alcohol abuse, uncomplicated: Secondary | ICD-10-CM

## 2022-10-08 DIAGNOSIS — I480 Paroxysmal atrial fibrillation: Secondary | ICD-10-CM | POA: Insufficient documentation

## 2022-10-08 DIAGNOSIS — I5022 Chronic systolic (congestive) heart failure: Secondary | ICD-10-CM | POA: Diagnosis present

## 2022-10-08 DIAGNOSIS — I495 Sick sinus syndrome: Secondary | ICD-10-CM

## 2022-10-08 DIAGNOSIS — I4819 Other persistent atrial fibrillation: Secondary | ICD-10-CM

## 2022-10-08 LAB — COMPREHENSIVE METABOLIC PANEL
ALT: 19 U/L (ref 0–44)
AST: 24 U/L (ref 15–41)
Albumin: 3.9 g/dL (ref 3.5–5.0)
Alkaline Phosphatase: 63 U/L (ref 38–126)
Anion gap: 8 (ref 5–15)
BUN: 19 mg/dL (ref 8–23)
CO2: 29 mmol/L (ref 22–32)
Calcium: 9.2 mg/dL (ref 8.9–10.3)
Chloride: 102 mmol/L (ref 98–111)
Creatinine, Ser: 0.97 mg/dL (ref 0.61–1.24)
GFR, Estimated: >60 mL/min (ref >60)
Glucose, Bld: 88 mg/dL (ref 70–99)
Potassium: 3.6 mmol/L (ref 3.5–5.1)
Sodium: 139 mmol/L (ref 135–145)
Total Bilirubin: 0.8 mg/dL (ref 0.3–1.2)
Total Protein: 7 g/dL (ref 6.5–8.1)

## 2022-10-08 LAB — BRAIN NATRIURETIC PEPTIDE: B Natriuretic Peptide: 337.5 pg/mL — ABNORMAL HIGH (ref 0.0–100.0)

## 2022-10-08 LAB — TSH: TSH: 1.691 u[IU]/mL (ref 0.350–4.500)

## 2022-10-08 LAB — CBC
HCT: 43.9 % (ref 39.0–52.0)
Hemoglobin: 14.5 g/dL (ref 13.0–17.0)
MCH: 34.9 pg — ABNORMAL HIGH (ref 26.0–34.0)
MCHC: 33 g/dL (ref 30.0–36.0)
MCV: 105.8 fL — ABNORMAL HIGH (ref 80.0–100.0)
Platelets: 286 10*3/uL (ref 150–400)
RBC: 4.15 MIL/uL — ABNORMAL LOW (ref 4.22–5.81)
RDW: 13.3 % (ref 11.5–15.5)
WBC: 6.9 10*3/uL (ref 4.0–10.5)
nRBC: 0 % (ref 0.0–0.2)

## 2022-10-08 MED ORDER — LOSARTAN POTASSIUM 25 MG PO TABS: 25.0000 mg | ORAL_TABLET | Freq: Every day | ORAL | 3 refills | Status: DC

## 2022-10-08 NOTE — Patient Instructions (Signed)
Medication Changes:  STOP Metoprolol  STOP Entresto  START Losartan 25 mg Daily AT BEDTIME  Lab Work:  Labs are needed today, please go to the [employer] for this, we will call you for any abnormal results  Testing/Procedures:  Your provider has recommended that  you wear a Zio Patch for 14 days.  This monitor will record your heart rhythm for our review.  IF you have any symptoms while wearing the monitor please press the button.  If you have any issues with the patch or you notice a red or orange light on it please call the company at [phone].  Once you remove the patch please mail it back to the company as soon as possible so we can get the results.  Referrals:  none  Special Instructions // Education:  Do the following things EVERYDAY: Weigh yourself in the morning before breakfast. Write it down and keep it in a log. Take your medicines as prescribed Eat low salt foods--Limit salt (sodium) to 2000 mg per day.  Stay as active as you can everyday Limit all fluids for the day to less than 2 liters   Follow-Up in: 6 weeks with an echocardiogram    If you have any questions or concerns before your next appointment please send [place] a message through mychart or call our office at [phone] Monday-Friday 8 am-5 pm.   If you have an urgent need after hours on the weekend please call your Primary Cardiologist or the [facility] in [place] at [phone].

## 2022-10-08 NOTE — Progress Notes (Signed)
ADVANCED HF CLINIC CONSULT NOTE  Referring Provider: Darylene Price, NP  Primary Care: [name], MD Primary Cardiologist: Leanna Sato  HPI:  Collin Knight. is a 76 y.o. male with medical history of PAF, lymphocytic colitis, SSS (s/p of Micra leadless pacemaker in 12/21), alcohol abuse, depression with anxiety and chronic systolic heart failure.   Referred by Leonor Liv NP for further evaluation of his HF  Admitted in 1/23 with CP and SOB. Found to be in AF with RVR. Echo 11/22/21 EF 55%-> 20-25%. Started on amiodarone.    Coronary CTA was performed 12/25/2021 with nonobstructive CAD: moderate RCA stenosis FFR 0.85, moderate proximal mid LAD stenosis FFR 0.84   Has been on amiodarone for rhythm control   Seen by Ms. Hackney in the [facility]. Recently started [employer]. Says it made him feel terrible. He got blurry-eyed and light-headed and fatigued. Took BP 131/78. Had LE edema several weeks ago and resolved. Occasional chest tightness. Says he was working out at [employer] a few weeks ago but over past few weeks has been markedly fatigued. Attributes this partially to [facility].   PPM interrogation 10/23  RV pacing 16.9% Underlying rhythm felt to be AF   PPM interrogation today 75% RV pacing  Drinks 12oz of wine every night.      Review of Systems: [y] = yes, '[ ]'$  = no   General: Weight gain '[ ]'$ ; Weight loss '[ ]'$ ; Anorexia '[ ]'$ ; Fatigue Blue.Reese ]; Fever '[ ]'$ ; Chills '[ ]'$ ; Weakness '[ ]'$   Cardiac: Chest pain/pressure '[ ]'$ ; Resting SOB Blue.Reese ]; Exertional SOB '[ ]'$ ; Orthopnea '[ ]'$ ; Pedal Edema '[ ]'$ ; Palpitations '[ ]'$ ; Syncope '[ ]'$ ; Presyncope [ y]; Paroxysmal nocturnal dyspnea'[ ]'$   Pulmonary: Cough '[ ]'$ ; Wheezing'[ ]'$ ; Hemoptysis'[ ]'$ ; Sputum '[ ]'$ ; Snoring '[ ]'$   GI: Vomiting'[ ]'$ ; Dysphagia'[ ]'$ ; Melena'[ ]'$ ; Hematochezia '[ ]'$ ; Heartburn'[ ]'$ ; Abdominal pain '[ ]'$ ; Constipation '[ ]'$ ; Diarrhea '[ ]'$ ; BRBPR '[ ]'$   GU: Hematuria'[ ]'$ ; Dysuria '[ ]'$ ; Nocturia'[ ]'$   Vascular: Pain in legs with walking '[ ]'$ ; Pain in feet with lying  flat '[ ]'$ ; Non-healing sores '[ ]'$ ; Stroke '[ ]'$ ; TIA '[ ]'$ ; Slurred speech '[ ]'$ ;  Neuro: Headaches'[ ]'$ ; Vertigo'[ ]'$ ; Seizures'[ ]'$ ; Paresthesias'[ ]'$ ;Blurred vision '[ ]'$ ; Diplopia '[ ]'$ ; Vision changes '[ ]'$   Ortho/Skin: Arthritis Blue.Reese ]; Joint pain Blue.Reese ]; Muscle pain '[ ]'$ ; Joint swelling '[ ]'$ ; Back Pain '[ ]'$ ; Rash '[ ]'$   Psych: Depression'[ ]'$ ; Anxiety'[ ]'$   Heme: Bleeding problems '[ ]'$ ; Clotting disorders '[ ]'$ ; Anemia '[ ]'$   Endocrine: Diabetes '[ ]'$ ; Thyroid dysfunction'[ ]'$    Past Medical History:  Diagnosis Date   Anxiety    Chronic atrial fibrillation (HCC)    Chronic systolic CHF (congestive heart failure) (HCC)    Pacemaker    SSS (sick sinus syndrome) (HCC)     Current Outpatient Medications  Medication Sig Dispense Refill   amiodarone (PACERONE) 200 MG tablet Take 1 tablet (200 mg total) by mouth daily. 30 tablet 1   dapagliflozin propanediol (FARXIGA) 10 MG TABS tablet Take 1 tablet (10 mg total) by mouth daily before breakfast. 90 tablet 3   furosemide (LASIX) 40 MG tablet Take 1 tablet (40 mg total) by mouth daily. 30 tablet 0   metoprolol succinate (TOPROL XL) 50 MG 24 hr tablet Take 1 tablet (50 mg total) by mouth daily. 30 tablet 0   sacubitril-valsartan (ENTRESTO) 24-26 MG Take 1 tablet by mouth 2 (two)  times daily. 180 tablet 3   warfarin (COUMADIN) 3 MG tablet Take 3 mg by mouth daily.     No current facility-administered medications for this visit.    No Known Allergies    Social History   Socioeconomic History   Marital status: Married    Spouse name: Not on file   Number of children: Not on file   Years of education: Not on file   Highest education level: Not on file  Occupational History   Not on file  Tobacco Use   Smoking status: Never   Smokeless tobacco: Never  Vaping Use   Vaping Use: Never used  Substance and Sexual Activity   Alcohol use: Yes    Alcohol/week: 21.0 standard drinks of alcohol    Types: 21 Cans of beer per week    Comment: 3 beers a day   Drug use: Never    Sexual activity: Yes  Other Topics Concern   Not on file  Social History Narrative   Not on file   Social Determinants of Health   Financial Resource Strain: Not on file  Food Insecurity: Not on file  Transportation Needs: Not on file  Physical Activity: Not on file  Stress: Not on file  Social Connections: Not on file  Intimate Partner Violence: Not on file      Family History  Problem Relation Age of Onset   Stroke Mother    Cancer - Lung Father     Vitals:   10/08/22 1449  BP: 130/84  Pulse: (!) 50  SpO2: 99%  Weight: 155 lb (70.3 kg)  Height: '5\' 10"'$  (1.778 m)    PHYSICAL EXAM: General:  Elderly male . No respiratory difficulty HEENT: normal Neck: supple. no JVD. Carotids 2+ bilat; no bruits. No lymphadenopathy or thryomegaly appreciated. Cor: PMI nondisplaced. Brady irregular No rubs, gallops or murmurs. Lungs: clear Abdomen: soft, nontender, nondistended. No hepatosplenomegaly. No bruits or masses. Good bowel sounds. Extremities: no cyanosis, clubbing, rash, edema Neuro: alert & oriented x 3, cranial nerves grossly intact. moves all 4 extremities w/o difficulty. Affect pleasant.  ECG 2/23: Sinus with PACs ECG: 10/08/22: Probable NSR with frequent PACs and RV pacing    ASSESSMENT & PLAN:   1. 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 Switch to losartan 25  - Stop Toprol with bradycardia and frequent RV pacing  - Continue Farxiga 10 - I suspect EF initially down to AF with RVR. Has not had repeat echo. Will repeat echo to help guide further therapy. Suspect EF may have recovered with control of AF rate. If EF remains depressed I suspect he may have RV pacing CM  - Will hold Toprol to try and limit RV pacing - Labs today  2. PAF  - On amiodarone 200 mg daily - Hard to tell what  rhythm is today but seems like sinus with frequent PACs and RV pacing - Will place Zio - Stop Toprol as above - Check amio labs  - Continue warfarin. ? Switch to DOAC  3. SSS - s/p Micra leadless pacer - Pacer interrogated today as above. 75% RV pacing - Plan as above.  - If RV pacing burden remains high and EF down may need CRT   4. ETOH abuse  - drinks 12 ounces  of wine per day - had long discussion regarding potential alcoholism and effect on heart - Suggested he wean slowly. Alternating 6 oz per day alternating with 12 oz on alternate days with eventual plan to get down to 6oz/day or less  Total time spent 45 minutes. Over half that time spent discussing above. I wil discuss next steps with Dr. Lacretia Nicks, MD  3:29 PM

## 2022-10-10 LAB — T3: T3, Total: 68 ng/dL — ABNORMAL LOW (ref 71–180)

## 2022-10-11 LAB — T4: T4, Total: 8 ug/dL (ref 4.5–12.0)

## 2022-10-16 ENCOUNTER — Encounter: Payer: Self-pay | Admitting: Cardiology

## 2022-10-19 ENCOUNTER — Other Ambulatory Visit (HOSPITAL_COMMUNITY): Payer: Self-pay | Admitting: Internal Medicine

## 2022-10-19 DIAGNOSIS — I480 Paroxysmal atrial fibrillation: Secondary | ICD-10-CM

## 2022-10-19 DIAGNOSIS — I5022 Chronic systolic (congestive) heart failure: Secondary | ICD-10-CM

## 2022-10-31 DIAGNOSIS — I471 Supraventricular tachycardia, unspecified: Secondary | ICD-10-CM

## 2022-10-31 HISTORY — DX: Supraventricular tachycardia, unspecified: I47.10

## 2022-10-31 NOTE — Addendum Note (Signed)
Encounter addended by: Micki Riley, RN on: 10/31/2022 11:55 AM  Actions taken: Imaging Exam ended

## 2022-11-05 IMAGING — CT CT HEART MORP W/ CTA COR W/ SCORE W/ CA W/CM &/OR W/O CM
2 of 11 series · 7 of 20 positions shown, 8 images · non-contrast
Comparison: None.

Addendum:
CLINICAL DATA: Cardiomyopathy, cp

EXAM:
Cardiac/Coronary  CTA
TECHNIQUE: The patient was scanned on a Siemens Somatom go.Top scanner.

[Series 25: multiphase % cta coronary (person_name) 0.60 · axial · 0.39mm/px · z∈[-1174,-1087]mm · 4 of 3949 slices shown, 5 images]
[im 790/3949  vessel]
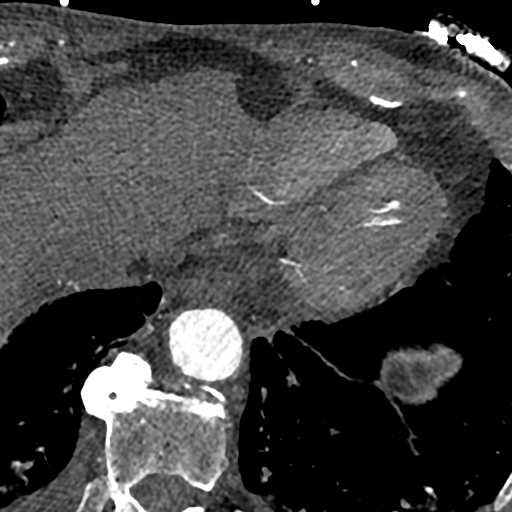
[im 790/3949  lung]
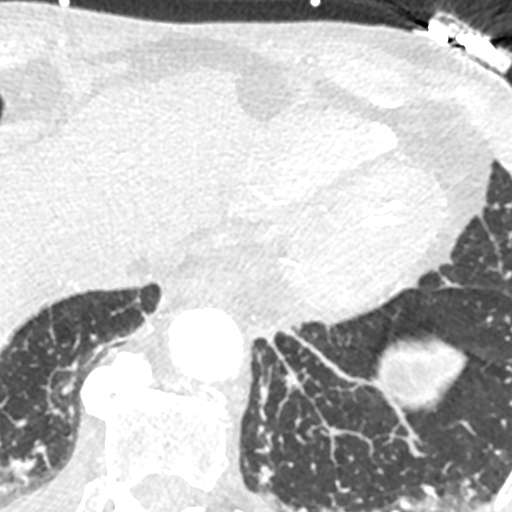
[im 1580/3949  vessel]
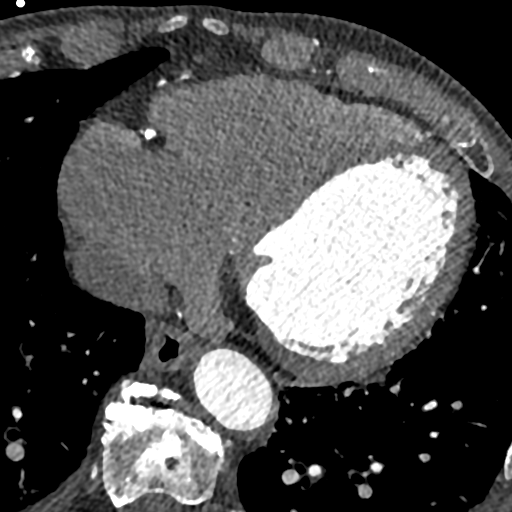
[im 2369/3949  vessel]
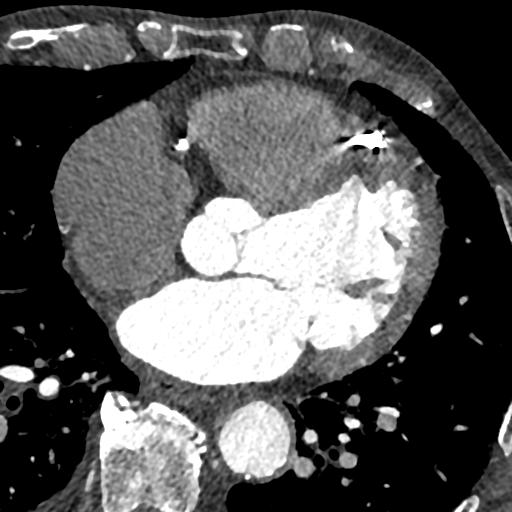
[im 3159/3949  vessel]
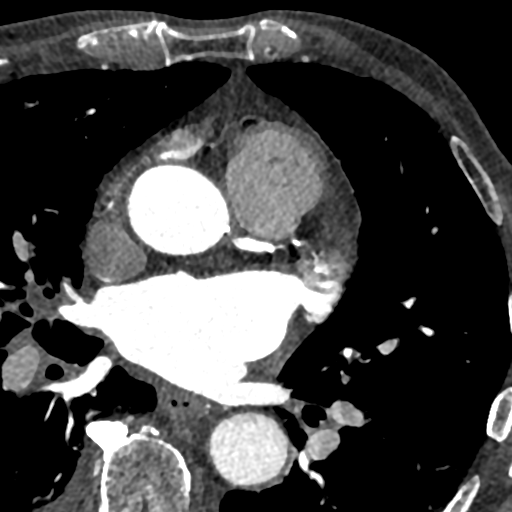

[Series 33: ms multiphase cta coronary (person_name) 0.60 · axial · 0.39mm/px · z∈[-1166,-1094]mm · 3 of 3231 slices shown]
[im 808/3231  vessel]
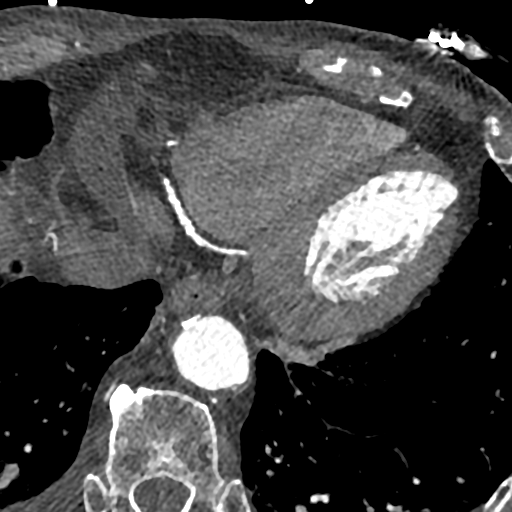
[im 1616/3231  vessel]
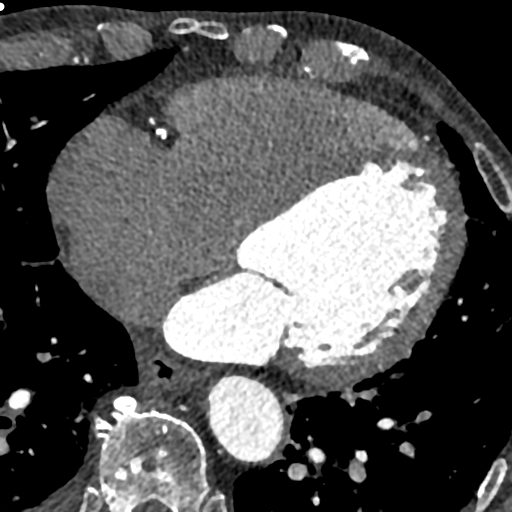
[im 2423/3231  vessel]
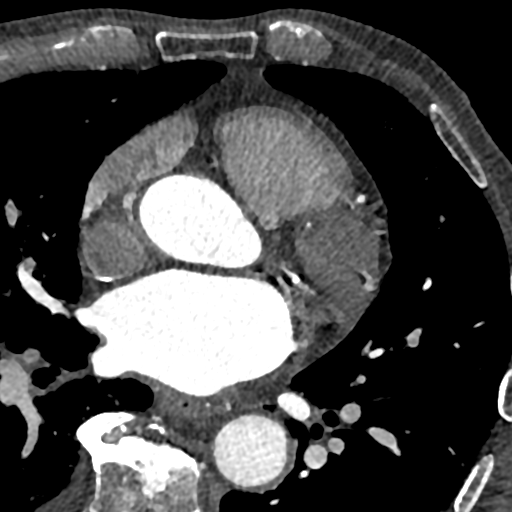

[7 of 20 positions shown; findings below may reference images not displayed]

:
A retrospective scan was triggered in the descending thoracic aorta.
Axial non-contrast 3 mm slices were carried out through the heart.
The data set was analyzed on a dedicated work station and scored
using the Agatson method. Gantry rotation speed was 330 msecs and
collimation was .6 mm. 25mg of metoprolol and 0.4 mg of sl NTG was
given. The 3D data set was reconstructed in 5% intervals of the
60-95 % of the R-R cycle. Diastolic phases were analyzed on a
dedicated work station using MPR, MIP and VRT modes. The patient
received 75 cc of contrast.
FINDINGS: Aorta: Normal size. Aortic root and descending aorta calcifications.
No dissection.

Aortic Valve:  Trileaflet.  No calcifications.

Coronary Arteries:  Normal coronary origin.  Right dominance.

RCA is a large dominant artery that gives rise to PDA and PLA. There
is calcified plaque in the proximal to mid segments causing moderate
stenosis (50%)

Left main is a large artery that gives rise to LAD and LCX arteries.
There is no LM disease

LAD has calcified plaque in the proximal to mid segment causing
moderate stenosis (50%).

LCX is a non-dominant artery that gives rise to one OM1 branch.
There is calcified plaque causing mild stenosis in the ostial OM1
(25-49%).

Other findings:

Normal pulmonary vein drainage into the left atrium.

Normal left atrial appendage without a thrombus.

Normal size of the pulmonary artery.
IMPRESSION: 1. Coronary calcium score of 1161. This was 76th percentile for age
and sex matched control.

2. Normal coronary origin with right dominance.

3. Moderate stenosis in the RCA and proximal-mid LAD.

4. Mild stenosis in the ostial OM1 branch.

5. CAD-RADS 3. Moderate stenosis. Consider symptom-guided
anti-ischemic pharmacotherapy as well as risk factor modification
per guideline directed care.

6. Additional analysis with CT FFR will be submitted and reported
separately.

EXAM:
OVER-READ INTERPRETATION  CT CHEST

The following report is an over-read performed by radiologist Dr.
Paulus N Ceejay [REDACTED] on 12/26/2021. This over-read
does not include interpretation of cardiac or coronary anatomy or
pathology. The coronary CTA interpretation by the cardiologist is
attached.
FINDINGS: Atherosclerotic calcification thoracic aorta, mild.

Upper normal caliber ascending thoracic aorta 3.9 cm diameter.

No pericardial effusion.

Visualized esophagus unremarkable.

No visualized adenopathy.

Small BILATERAL pleural effusions with compressive atelectasis of
the adjacent lower lobes.

Emphysematous and bronchitic changes.

No infiltrate or pneumothorax.

Bones demineralized with scattered degenerative changes of the
thoracic spine.
IMPRESSION: Small BILATERAL pleural effusions with compressive atelectasis of
the adjacent lower lobes.

Underlying emphysematous and bronchitic changes consistent with
COPD.

Upper normal caliber ascending thoracic aorta 3.9 cm diameter.

Aortic Atherosclerosis (S9VBF-VJ4.4).

Emphysema (S9VBF-N3M.9).

*** End of Addendum ***
:
A retrospective scan was triggered in the descending thoracic aorta.
Axial non-contrast 3 mm slices were carried out through the heart.
The data set was analyzed on a dedicated work station and scored
using the Agatson method. Gantry rotation speed was 330 msecs and
collimation was .6 mm. 25mg of metoprolol and 0.4 mg of sl NTG was
given. The 3D data set was reconstructed in 5% intervals of the
60-95 % of the R-R cycle. Diastolic phases were analyzed on a
dedicated work station using MPR, MIP and VRT modes. The patient
received 75 cc of contrast.
FINDINGS: Aorta: Normal size. Aortic root and descending aorta calcifications.
No dissection.

Aortic Valve:  Trileaflet.  No calcifications.

Coronary Arteries:  Normal coronary origin.  Right dominance.

RCA is a large dominant artery that gives rise to PDA and PLA. There
is calcified plaque in the proximal to mid segments causing moderate
stenosis (50%)

Left main is a large artery that gives rise to LAD and LCX arteries.
There is no LM disease

LAD has calcified plaque in the proximal to mid segment causing
moderate stenosis (50%).

LCX is a non-dominant artery that gives rise to one OM1 branch.
There is calcified plaque causing mild stenosis in the ostial OM1
(25-49%).

Other findings:

Normal pulmonary vein drainage into the left atrium.

Normal left atrial appendage without a thrombus.

Normal size of the pulmonary artery.
IMPRESSION: 1. Coronary calcium score of 1161. This was 76th percentile for age
and sex matched control.

2. Normal coronary origin with right dominance.

3. Moderate stenosis in the RCA and proximal-mid LAD.

4. Mild stenosis in the ostial OM1 branch.

5. CAD-RADS 3. Moderate stenosis. Consider symptom-guided
anti-ischemic pharmacotherapy as well as risk factor modification
per guideline directed care.

6. Additional analysis with CT FFR will be submitted and reported
separately.

## 2022-11-23 ENCOUNTER — Ambulatory Visit: Payer: Medicare Other

## 2022-12-10 ENCOUNTER — Encounter: Payer: Medicare Other | Admitting: Internal Medicine

## 2022-12-14 ENCOUNTER — Ambulatory Visit
Admission: RE | Admit: 2022-12-14 | Discharge: 2022-12-14 | Disposition: A | Discharge status: Home / Self Care | Payer: Medicare Other | Source: Ambulatory Visit | Attending: Internal Medicine | Admitting: Internal Medicine

## 2022-12-14 DIAGNOSIS — I5022 Chronic systolic (congestive) heart failure: Secondary | ICD-10-CM | POA: Diagnosis present

## 2022-12-14 DIAGNOSIS — R079 Chest pain, unspecified: Secondary | ICD-10-CM | POA: Diagnosis not present

## 2022-12-14 DIAGNOSIS — I110 Hypertensive heart disease with heart failure: Secondary | ICD-10-CM | POA: Insufficient documentation

## 2022-12-14 DIAGNOSIS — I2510 Atherosclerotic heart disease of native coronary artery without angina pectoris: Secondary | ICD-10-CM | POA: Insufficient documentation

## 2022-12-14 DIAGNOSIS — R55 Syncope and collapse: Secondary | ICD-10-CM | POA: Diagnosis not present

## 2022-12-14 DIAGNOSIS — R0602 Shortness of breath: Secondary | ICD-10-CM | POA: Insufficient documentation

## 2022-12-14 DIAGNOSIS — Z950 Presence of cardiac pacemaker: Secondary | ICD-10-CM | POA: Insufficient documentation

## 2022-12-14 LAB — ECHOCARDIOGRAM COMPLETE
AR max vel: 2.83 cm2
AV Area VTI: 2.23 cm2
AV Area mean vel: 2.26 cm2
AV Mean grad: 2 mmHg
AV Peak grad: 4.7 mmHg
Ao pk vel: 1.08 m/s
Area-P 1/2: 2.22 cm2
Calc EF: 47.5 %
MV VTI: 1.72 cm2
S' Lateral: 4.3 cm
Single Plane A2C EF: 45.9 %
Single Plane A4C EF: 46.4 %

## 2022-12-14 NOTE — Progress Notes (Signed)
*  PRELIMINARY RESULTS* Echocardiogram 2D Echocardiogram has been performed.  Collin Knight 12/14/2022, 12:10 PM

## 2023-01-01 ENCOUNTER — Telehealth: Payer: Self-pay | Admitting: Family

## 2023-01-01 NOTE — Telephone Encounter (Signed)
Incoming Fax from [employer] confirms shipment sent via [employer] [id number] on 3.6.24 with tracking number of [id number] to be sent to patient's address on file.

## 2023-01-02 ENCOUNTER — Other Ambulatory Visit
Admission: RE | Admit: 2023-01-02 | Discharge: 2023-01-02 | Disposition: A | Discharge status: Home / Self Care | Payer: Medicare Other | Source: Ambulatory Visit | Attending: Internal Medicine | Admitting: Internal Medicine

## 2023-01-02 ENCOUNTER — Encounter: Payer: Self-pay | Admitting: Internal Medicine

## 2023-01-02 ENCOUNTER — Ambulatory Visit (HOSPITAL_BASED_OUTPATIENT_CLINIC_OR_DEPARTMENT_OTHER): Payer: Medicare Other | Admitting: Internal Medicine

## 2023-01-02 VITALS — BP 140/80 | HR 50 | Wt 161.4 lb

## 2023-01-02 DIAGNOSIS — I5022 Chronic systolic (congestive) heart failure: Secondary | ICD-10-CM | POA: Diagnosis not present

## 2023-01-02 DIAGNOSIS — I482 Chronic atrial fibrillation, unspecified: Secondary | ICD-10-CM | POA: Diagnosis not present

## 2023-01-02 DIAGNOSIS — I495 Sick sinus syndrome: Secondary | ICD-10-CM

## 2023-01-02 DIAGNOSIS — F1010 Alcohol abuse, uncomplicated: Secondary | ICD-10-CM | POA: Diagnosis not present

## 2023-01-02 LAB — BASIC METABOLIC PANEL
Anion gap: 6 (ref 5–15)
BUN: 17 mg/dL (ref 8–23)
CO2: 26 mmol/L (ref 22–32)
Calcium: 8.4 mg/dL — ABNORMAL LOW (ref 8.9–10.3)
Chloride: 105 mmol/L (ref 98–111)
Creatinine, Ser: 1.08 mg/dL (ref 0.61–1.24)
GFR, Estimated: >60 mL/min (ref >60)
Glucose, Bld: 89 mg/dL (ref 70–99)
Potassium: 3.9 mmol/L (ref 3.5–5.1)
Sodium: 137 mmol/L (ref 135–145)

## 2023-01-02 MED ORDER — AMIODARONE HCL 100 MG PO TABS: 100.0000 mg | ORAL_TABLET | Freq: Every day | ORAL | 3 refills | Status: DC

## 2023-01-02 MED ORDER — SPIRONOLACTONE 25 MG PO TABS: 12.5000 mg | ORAL_TABLET | Freq: Every day | ORAL | 3 refills | Status: DC

## 2023-01-02 NOTE — Progress Notes (Signed)
ADVANCED HF CLINIC CONSULT NOTE  Referring Provider: Darylene Price, NP  Primary Care: [name], MD Primary Cardiologist: Leanna Sato  HPI:  Collin Knight. is a 77 y.o. male with medical history of PAF, lymphocytic colitis, SSS (s/p of Micra leadless pacemaker in 12/21), alcohol abuse, depression with anxiety and chronic systolic heart failure.   Referred by Leonor Liv NP for further evaluation of his HF  Admitted in 1/23 with CP and SOB. Found to be in AF with RVR. Echo 11/22/21 EF 55%-> 20-25%. Started on amiodarone.    Coronary CTA was performed 12/25/2021 with nonobstructive CAD: moderate RCA stenosis FFR 0.85, moderate proximal mid LAD stenosis FFR 0.84   Has been on amiodarone for rhythm control   Seen by Ms. Hackney in the [facility] in 12/23. Started Entresto. Says it made him feel terrible. He got blurry-eyed and light-headed and fatigued.   I saw him in 12/23. Felt EF down to previous AF with RVR or possible RV pacing. When I saw him HR was in 18s with 75% RV pacing so Toprol stopped.   Repeat echo 2/24 EF 25-30%   PPM interrogation 10/23  RV pacing 16.9% Underlying rhythm felt to be AF   PPM interrogation 12/23 75% RV pacing  Cutting back on wine intake now 12oz -> 8 oz of wine every night. (Skipped 3 nights last week)   Here with his wife. Says breathing is pretty good. Occasionally gets mild SOB and will take lasix. Can do almost anything he needs to do as long as he takes his time. No CP, orthopnea or PND. Compliant with meds. Gets about 1K steps per day. Working on getting 2K  PPM interrogation today 83,5%  Past Medical History:  Diagnosis Date   Anxiety    Chronic atrial fibrillation (HCC)    Chronic systolic CHF (congestive heart failure) (HCC)    Pacemaker    SSS (sick sinus syndrome) (HCC)     Current Outpatient Medications  Medication Sig Dispense Refill   amiodarone (PACERONE) 200 MG tablet Take 1 tablet (200 mg total) by mouth daily. 30  tablet 1   dapagliflozin propanediol (FARXIGA) 10 MG TABS tablet Take 1 tablet (10 mg total) by mouth daily before breakfast. 90 tablet 3   furosemide (LASIX) 40 MG tablet Take 1 tablet (40 mg total) by mouth daily. (Patient taking differently: Take 40 mg by mouth daily. Takes 20 mg (0.5 tab) as needed.) 30 tablet 0   losartan (COZAAR) 25 MG tablet Take 1 tablet (25 mg total) by mouth daily. 30 tablet 3   warfarin (COUMADIN) 3 MG tablet Take 3 mg by mouth daily.     No current facility-administered medications for this visit.    No Known Allergies    Social History   Socioeconomic History   Marital status: Married    Spouse name: Not on file   Number of children: Not on file   Years of education: Not on file   Highest education level: Not on file  Occupational History   Not on file  Tobacco Use   Smoking status: Never   Smokeless tobacco: Never  Vaping Use   Vaping Use: Never used  Substance and Sexual Activity   Alcohol use: Yes    Alcohol/week: 21.0 standard drinks of alcohol    Types: 21 Cans of beer per week    Comment: 3 beers a day   Drug use: Never   Sexual activity: Yes  Other Topics Concern  Not on file  Social History Narrative   Not on file   Social Determinants of Health   Financial Resource Strain: Not on file  Food Insecurity: Not on file  Transportation Needs: Not on file  Physical Activity: Not on file  Stress: Not on file  Social Connections: Not on file  Intimate Partner Violence: Not on file      Family History  Problem Relation Age of Onset   Stroke Mother    Cancer - Lung Father     Vitals:   01/02/23 1058  BP: (!) 140/80  Pulse: (!) 50  SpO2: 99%  Weight: 161 lb 6.4 oz (73.2 kg)    ECG with severe sinus bradycardia and RV pacing at 50.   PHYSICAL EXAM: General:  Well appearing. No resp difficulty HEENT: normal Neck: supple. no JVD. Carotids 2+ bilat; no bruits. No lymphadenopathy or thryomegaly appreciated. Cor: PMI  nondisplaced. Regular rate & rhythm. No rubs, gallops or murmurs. Lungs: clear Abdomen: soft, nontender, nondistended. No hepatosplenomegaly. No bruits or masses. Good bowel sounds. Extremities: no cyanosis, clubbing, rash, edema Neuro: alert & orientedx3, cranial nerves grossly intact. moves all 4 extremities w/o difficulty. Affect pleasant   ASSESSMENT & PLAN:   1. Chronic HFrEF  - Echo 2/23 EF 55% -> 20-25% in setting of AF with RVR - Coronary CT 3/23 non-obstructive CAD - NYHA II-III - Unable to tolerate Entresto. Continue losartan 25  - Toprol stopped 12/23 with bradycardia and frequent RV pacing  - Continue Farxiga 10 - Start spiro 12.5 labs today and 1 week - I saw him in 12/23. Felt EF initially down (1/23) due to AF with RVR. 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 I d/w Dr. Saralyn Pilar   2. PAF  - On amiodarone 200 mg daily - Now with severe sinus brady and RV pacing - Zio with predominantly RV pacing at 50  - Will decrease amio to 100 daily (suspect he will benefit from maintaining NSR long-term). Needs appt with EP as above  - Continue warfarin. ? Switch to DOAC  3. SSS - s/p Micra leadless pacer - Pacer interrogated today as above. 84% RV pacing - Plan as above.   4. ETOH abuse  - he is now cutting back but requiring valium for increased anxiety - asked him to discuss further with PCP regarding counseling and possible SSRI/SSNI (watching QT carefully)    Glori Bickers, MD  11:20 AM

## 2023-01-02 NOTE — Patient Instructions (Signed)
Medication Changes:  BEGIN taking Spironolactone 12.5 mg tablet daily  DECREASE Amiodarone to one 100 mg tablet daily New Prescriptions have been sent to your pharmacy   Lab Work:  Labs done today, your results will be available in [place], we will contact you for abnormal readings.  Return in one week to [facility] to recheck labwork   Referrals:  You will hear from our clinic or [facility] to follow up on referral to Cardiac electrophysiology office.   Special Instructions // Education:  Do the following things EVERYDAY: Weigh yourself in the morning before breakfast. Write it down and keep it in a log. Take your medicines as prescribed Eat low salt foods--Limit salt (sodium) to 2000 mg per day.  Stay as active as you can everyday Limit all fluids for the day to less than 2 liters   Follow-Up in: 3 months with Dr. Haroldine Laws     If you have any questions or concerns before your next appointment please send [place] a message through mychart or call our office at [phone] Monday-Friday 8 am-5 pm.   If you have an urgent need after hours on the weekend please call your Primary Cardiologist or the [facility] in [place] at [phone]. Do the following things EVERYDAY: Weigh yourself in the morning before breakfast. Write it down and keep it in a log. Take your medicines as prescribed Eat low salt foods--Limit salt (sodium) to 2000 mg per day.  Stay as active as you can everyday Limit all fluids for the day to less than 2 liters

## 2023-01-04 ENCOUNTER — Other Ambulatory Visit: Payer: Self-pay | Admitting: Internal Medicine

## 2023-01-15 ENCOUNTER — Other Ambulatory Visit: Payer: Self-pay | Admitting: *Deleted

## 2023-01-22 NOTE — Progress Notes (Unsigned)
Electrophysiology Office Note:    Date:  01/23/2023   ID:  [facility] DOB 12/01/1945, MRN [id number]  Collin Knight Cardiologist:  None  [facility] Electrophysiologist:  Vickie Epley, MD   Referring MD: [name], MD   Chief Complaint: HF  History of Present Illness:    Collin Carpen. is a 77 y.o. male who I am seeing today for an evaluation of symptomatic bradycardia, heart failure at the request of Dr Saralyn Pilar and [place].   The patient has a history of lymphocytic colitis, sick sinus syndrome s/p PPM, pAF, EtOH abuse, depression/anxiety and chronic systolic heart failure.   Saw Dr Jeffie Pollock 01/02/2023. His prior reduced EF thought to be 2/2 AF w RVR vs RV pacing. At the last appointment he was intermittently short of breath. At that appointment, V pacing 83.5%.  He is maintaining sinus rhythm on amiodarone but is severely bradycardic.  09/29/2020 consult note from [facility] reviewed - patient admitted with syncope. Found to have AF w/ RVR alternating with sinus. There were pauses observed on telemetry. Heart rates during that admission were in the low 50s.      Their past medical, social and family history was reveiwed.   ROS:   Please see the history of present illness.    All other systems reviewed and are negative.  EKGs/Labs/Other Studies Reviewed:    The following studies were reviewed today:  09/2020 Echo - EF 50-55% 11/22/2021 Echo - EF 20-25%  10/16/2022 remote interrogation - 75% V pacing at VVI 50   10/01/2020 ECG shows sinus bradycardia and V pacing with retrograde P waves.    11/21/2021 ECG shows AF w/ RVR. Narrow complex QRS  10/08/2022 ECG shows sinus bradycardia, intermittent V pacing with retrograde P waves.     Physical Exam:    VS:  BP 136/84   Pulse (!) 50   Ht 5\' 10"  (1.778 m)   Wt 155 lb (70.3 kg)   SpO2 96%   BMI 22.24 kg/m     Wt Readings from Last 3 Encounters:  01/23/23 155 lb (70.3  kg)  01/02/23 161 lb 6.4 oz (73.2 kg)  10/08/22 155 lb (70.3 kg)     GEN:  Well nourished, well developed in no acute distress.  Elderly CARDIAC: Bradycardic.  Prominent jugular pulsation consistent with pacemaker syndrome., no murmurs, rubs, gallops RESPIRATORY:  Clear to auscultation without rales, wheezing or rhonchi       ASSESSMENT AND PLAN:    1. SSS (sick sinus syndrome)   2. Chronic systolic congestive heart failure   3. Persistent atrial fibrillation   4. Alcohol abuse   5. Cardiac pacemaker in situ     #HFrEF NYHA III. Warm and dry on exam.  Follows with Dr Jeffie Pollock in HF clinic and Dr Saralyn Pilar for general cardiology. On good medical therapy.  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 He is a heavy user of alcohol. He has a history of AF w/ RVR but this is seemingly fairly well controlled now on amiodarone. He has evidence of significant sinus node dysfunction that is chronic. Looking back to 2021, his sinus rates were low and those have worsened now this his AF is being targeted with amiodarone. This has led to V pacing and retrograde P waves. This is likely contributing to his fatigue and shortness of breath.   I do think he would benefit from an upgrade of his pacemaker system to a traditional transvenous system.  He has a narrow QRS when he conducts through the AV node but I do not know how reliable his AV node is especially now that he is on amiodarone. For this reason, a biV device would be preferred.  I have recommended upgrade to a CRT system.   He is not a candidate for Micra removal given the location of the device on a recent CT scan. I would disable the Micra after placement of the new system.  Given his advanced age, would favor a CRT-P over CRT-D.  Plan for Medtronic device.  He will hold his Coumadin for 5 days prior to the implant.    #SSS #Syncope #Micra pacemaker in situ Will disable Micra at the time of CRT  implant.      Signed, Hilton Cork. Quentin Ore, MD, [facility], [facility] 01/23/2023 9:04 AM    Electrophysiology [facility]

## 2023-01-22 NOTE — H&P (View-Only) (Signed)
Electrophysiology Office Note:    Date:  01/23/2023   ID:  [facility] DOB 12/01/1945, MRN [id number]  [place] Cardiologist:  None  [facility] Electrophysiologist:  Vickie Epley, MD   Referring MD: [name], MD   Chief Complaint: HF  History of Present Illness:    Collin Carpen. is a 77 y.o. male who I am seeing today for an evaluation of symptomatic bradycardia, heart failure at the request of Dr Saralyn Pilar and [place].   The patient has a history of lymphocytic colitis, sick sinus syndrome s/p PPM, pAF, EtOH abuse, depression/anxiety and chronic systolic heart failure.   Saw Dr Jeffie Pollock 01/02/2023. His prior reduced EF thought to be 2/2 AF w RVR vs RV pacing. At the last appointment he was intermittently short of breath. At that appointment, V pacing 83.5%.  He is maintaining sinus rhythm on amiodarone but is severely bradycardic.  09/29/2020 consult note from [facility] reviewed - patient admitted with syncope. Found to have AF w/ RVR alternating with sinus. There were pauses observed on telemetry. Heart rates during that admission were in the low 50s.      Their past medical, social and family history was reveiwed.   ROS:   Please see the history of present illness.    All other systems reviewed and are negative.  EKGs/Labs/Other Studies Reviewed:    The following studies were reviewed today:  09/2020 Echo - EF 50-55% 11/22/2021 Echo - EF 20-25%  10/16/2022 remote interrogation - 75% V pacing at VVI 50   10/01/2020 ECG shows sinus bradycardia and V pacing with retrograde P waves.    11/21/2021 ECG shows AF w/ RVR. Narrow complex QRS  10/08/2022 ECG shows sinus bradycardia, intermittent V pacing with retrograde P waves.     Physical Exam:    VS:  BP 136/84   Pulse (!) 50   Ht 5\' 10"  (1.778 m)   Wt 155 lb (70.3 kg)   SpO2 96%   BMI 22.24 kg/m     Wt Readings from Last 3 Encounters:  01/23/23 155 lb (70.3  kg)  01/02/23 161 lb 6.4 oz (73.2 kg)  10/08/22 155 lb (70.3 kg)     GEN:  Well nourished, well developed in no acute distress.  Elderly CARDIAC: Bradycardic.  Prominent jugular pulsation consistent with pacemaker syndrome., no murmurs, rubs, gallops RESPIRATORY:  Clear to auscultation without rales, wheezing or rhonchi       ASSESSMENT AND PLAN:    1. SSS (sick sinus syndrome)   2. Chronic systolic congestive heart failure   3. Persistent atrial fibrillation   4. Alcohol abuse   5. Cardiac pacemaker in situ     #HFrEF NYHA III. Warm and dry on exam.  Follows with Dr Jeffie Pollock in HF clinic and Dr Saralyn Pilar for general cardiology. On good medical therapy.  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 He is a heavy user of alcohol. He has a history of AF w/ RVR but this is seemingly fairly well controlled now on amiodarone. He has evidence of significant sinus node dysfunction that is chronic. Looking back to 2021, his sinus rates were low and those have worsened now this his AF is being targeted with amiodarone. This has led to V pacing and retrograde P waves. This is likely contributing to his fatigue and shortness of breath.   I do think he would benefit from an upgrade of his pacemaker system to a traditional transvenous system.  He has a narrow QRS when he conducts through the AV node but I do not know how reliable his AV node is especially now that he is on amiodarone. For this reason, a biV device would be preferred.  I have recommended upgrade to a CRT system.   He is not a candidate for Micra removal given the location of the device on a recent CT scan. I would disable the Micra after placement of the new system.  Given his advanced age, would favor a CRT-P over CRT-D.  Plan for Medtronic device.  He will hold his Coumadin for 5 days prior to the implant.    #SSS #Syncope #Micra pacemaker in situ Will disable Micra at the time of CRT  implant.      Signed, Collin Cork. Quentin Ore, MD, [facility], [facility] 01/23/2023 9:04 AM    Electrophysiology [facility]

## 2023-01-23 ENCOUNTER — Other Ambulatory Visit
Admission: RE | Admit: 2023-01-23 | Discharge: 2023-01-23 | Disposition: A | Discharge status: Home / Self Care | Payer: Medicare Other | Attending: Cardiology | Admitting: Cardiology

## 2023-01-23 ENCOUNTER — Ambulatory Visit: Payer: Medicare Other | Attending: Cardiology | Admitting: Cardiology

## 2023-01-23 ENCOUNTER — Encounter: Payer: Self-pay | Admitting: Cardiology

## 2023-01-23 VITALS — BP 136/84 | HR 50 | Ht 70.0 in | Wt 155.0 lb

## 2023-01-23 DIAGNOSIS — Z950 Presence of cardiac pacemaker: Secondary | ICD-10-CM | POA: Diagnosis present

## 2023-01-23 DIAGNOSIS — F1010 Alcohol abuse, uncomplicated: Secondary | ICD-10-CM

## 2023-01-23 DIAGNOSIS — I4819 Other persistent atrial fibrillation: Secondary | ICD-10-CM | POA: Insufficient documentation

## 2023-01-23 DIAGNOSIS — I5022 Chronic systolic (congestive) heart failure: Secondary | ICD-10-CM | POA: Insufficient documentation

## 2023-01-23 DIAGNOSIS — I495 Sick sinus syndrome: Secondary | ICD-10-CM | POA: Insufficient documentation

## 2023-01-23 LAB — BASIC METABOLIC PANEL
Anion gap: 7 (ref 5–15)
BUN: 15 mg/dL (ref 8–23)
CO2: 23 mmol/L (ref 22–32)
Calcium: 8.8 mg/dL — ABNORMAL LOW (ref 8.9–10.3)
Chloride: 107 mmol/L (ref 98–111)
Creatinine, Ser: 1.03 mg/dL (ref 0.61–1.24)
GFR, Estimated: >60 mL/min (ref >60)
Glucose, Bld: 93 mg/dL (ref 70–99)
Potassium: 4.2 mmol/L (ref 3.5–5.1)
Sodium: 137 mmol/L (ref 135–145)

## 2023-01-23 LAB — CBC WITH DIFFERENTIAL/PLATELET
Abs Immature Granulocytes: 0.05 10*3/uL (ref 0.00–0.07)
Basophils Absolute: 0 10*3/uL (ref 0.0–0.1)
Basophils Relative: 1 %
Eosinophils Absolute: 0.2 10*3/uL (ref 0.0–0.5)
Eosinophils Relative: 4 %
HCT: 43.9 % (ref 39.0–52.0)
Hemoglobin: 14.4 g/dL (ref 13.0–17.0)
Immature Granulocytes: 1 %
Lymphocytes Relative: 22 %
Lymphs Abs: 1.5 10*3/uL (ref 0.7–4.0)
MCH: 34.1 pg — ABNORMAL HIGH (ref 26.0–34.0)
MCHC: 32.8 g/dL (ref 30.0–36.0)
MCV: 104 fL — ABNORMAL HIGH (ref 80.0–100.0)
Monocytes Absolute: 1.1 10*3/uL — ABNORMAL HIGH (ref 0.1–1.0)
Monocytes Relative: 16 %
Neutro Abs: 3.8 10*3/uL (ref 1.7–7.7)
Neutrophils Relative %: 56 %
Platelets: 291 10*3/uL (ref 150–400)
RBC: 4.22 MIL/uL (ref 4.22–5.81)
RDW: 13.2 % (ref 11.5–15.5)
WBC: 6.7 10*3/uL (ref 4.0–10.5)
nRBC: 0 % (ref 0.0–0.2)

## 2023-01-23 NOTE — Patient Instructions (Signed)
Medication Instructions:  Your physician recommends that you continue on your current medications as directed. Please refer to the Current Medication list given to you today.  *If you need a refill on your cardiac medications before your next appointment, please call your pharmacy*  Lab Work: [facility] and [place] will get your lab work at [facility]) hospital.  Your lab work will be done at the [facility] next to [employer]. These are walk in labs- you will not need an appointment and you do not need to be fasting.    Testing/Procedures: Your physician has recommended that you have a pacemaker inserted. A pacemaker is a small device that is placed under the skin of your chest or abdomen to help control abnormal heart rhythms. This device uses electrical pulses to prompt the heart to beat at a normal rate. Pacemakers are used to treat heart rhythms that are too slow. Wire (leads) are attached to the pacemaker that goes into the chambers of you heart. This is done in the hospital and usually requires and overnight stay. Please see the instruction sheet given to you today for more information.  Follow-Up: At [facility], you and your health needs are our priority.  As part of our continuing mission to provide you with exceptional heart care, we have created designated Provider Care Teams.  These Care Teams include your primary Cardiologist (physician) and Advanced Practice Providers (APPs -  Physician Assistants and Nurse Practitioners) who all work together to provide you with the care you need, when you need it.  Your next appointment:   We will call you to arrange your follow up appointment

## 2023-02-12 ENCOUNTER — Telehealth: Payer: Self-pay

## 2023-02-12 NOTE — Telephone Encounter (Signed)
Spoke with the patient and his wife and reviewed medications instructions prior to his procedure with Dr. Lalla Brothers on 4/30. Patient's last dose of coumadin will be on Thursday 4/25 and last dose of farxiga will be on 4/26. Patient verbalized understanding.

## 2023-02-18 NOTE — Pre-Procedure Instructions (Signed)
Instructed patient on the following items: Arrival time 2:00 Nothing to eat or drink after midnight No meds AM of procedure Responsible person to drive you home and stay with you for 24 hrs Wash with special soap night before and morning of procedure If on anti-coagulant drug instructions Coumadin- last dose 4/25

## 2023-02-19 ENCOUNTER — Other Ambulatory Visit: Payer: Self-pay

## 2023-02-19 ENCOUNTER — Encounter (HOSPITAL_COMMUNITY)
Admission: RE | Disposition: A | Discharge status: Home / Self Care | Payer: Self-pay | Source: Home / Self Care | Attending: Cardiology

## 2023-02-19 ENCOUNTER — Ambulatory Visit (HOSPITAL_COMMUNITY)
Admission: RE | Admit: 2023-02-19 | Discharge: 2023-02-20 | Disposition: A | Discharge status: Home / Self Care | Payer: Medicare Other | Attending: Cardiology | Admitting: Cardiology

## 2023-02-19 DIAGNOSIS — I482 Chronic atrial fibrillation, unspecified: Secondary | ICD-10-CM

## 2023-02-19 DIAGNOSIS — I495 Sick sinus syndrome: Secondary | ICD-10-CM | POA: Insufficient documentation

## 2023-02-19 DIAGNOSIS — I5022 Chronic systolic (congestive) heart failure: Secondary | ICD-10-CM | POA: Diagnosis not present

## 2023-02-19 DIAGNOSIS — I4819 Other persistent atrial fibrillation: Secondary | ICD-10-CM | POA: Diagnosis not present

## 2023-02-19 DIAGNOSIS — F1010 Alcohol abuse, uncomplicated: Secondary | ICD-10-CM | POA: Diagnosis not present

## 2023-02-19 DIAGNOSIS — Z950 Presence of cardiac pacemaker: Secondary | ICD-10-CM | POA: Diagnosis present

## 2023-02-19 DIAGNOSIS — Z7901 Long term (current) use of anticoagulants: Secondary | ICD-10-CM | POA: Diagnosis not present

## 2023-02-19 DIAGNOSIS — R42 Dizziness and giddiness: Secondary | ICD-10-CM | POA: Diagnosis not present

## 2023-02-19 DIAGNOSIS — I429 Cardiomyopathy, unspecified: Secondary | ICD-10-CM | POA: Diagnosis not present

## 2023-02-19 HISTORY — PX: BIV PACEMAKER INSERTION CRT-P: EP1199

## 2023-02-19 LAB — PROTIME-INR
INR: 1.2 (ref 0.8–1.2)
Prothrombin Time: 15.2 seconds (ref 11.4–15.2)

## 2023-02-19 SURGERY — BIV PACEMAKER INSERTION CRT-P

## 2023-02-19 MED ORDER — AMIODARONE HCL 100 MG PO TABS
100.0000 mg | ORAL_TABLET | Freq: Every day | ORAL | Status: DC
  Administered 2023-02-20: 100 mg via ORAL
  Filled 2023-02-19: qty 1

## 2023-02-19 MED ORDER — LIDOCAINE HCL (PF) 1 % IJ SOLN
INTRAMUSCULAR | Status: DC | PRN
  Administered 2023-02-19: 50 mL

## 2023-02-19 MED ORDER — CEFAZOLIN SODIUM-DEXTROSE 2-4 GM/100ML-% IV SOLN
INTRAVENOUS | Status: AC
  Administered 2023-02-19: 2 g via INTRAVENOUS
  Filled 2023-02-19: qty 100

## 2023-02-19 MED ORDER — POVIDONE-IODINE 10 % EX SWAB
2.0000 | Freq: Once | CUTANEOUS | Status: AC
  Administered 2023-02-19: 2 via TOPICAL

## 2023-02-19 MED ORDER — LIDOCAINE HCL 1 % IJ SOLN
INTRAMUSCULAR | Status: AC
  Filled 2023-02-19: qty 60

## 2023-02-19 MED ORDER — SODIUM CHLORIDE 0.9 % IV SOLN
80.0000 mg | INTRAVENOUS | Status: AC
  Administered 2023-02-19: 80 mg

## 2023-02-19 MED ORDER — HEPARIN (PORCINE) IN NACL 1000-0.9 UT/500ML-% IV SOLN
INTRAVENOUS | Status: DC | PRN
  Administered 2023-02-19 (×2): 500 mL

## 2023-02-19 MED ORDER — FENTANYL CITRATE (PF) 100 MCG/2ML IJ SOLN
INTRAMUSCULAR | Status: DC | PRN
  Administered 2023-02-19: 25 ug via INTRAVENOUS

## 2023-02-19 MED ORDER — CEFAZOLIN SODIUM-DEXTROSE 2-4 GM/100ML-% IV SOLN: 2.0000 g | INTRAVENOUS | Status: AC

## 2023-02-19 MED ORDER — SODIUM CHLORIDE 0.9 % IV SOLN
INTRAVENOUS | Status: AC
  Filled 2023-02-19: qty 2

## 2023-02-19 MED ORDER — FENTANYL CITRATE (PF) 100 MCG/2ML IJ SOLN
INTRAMUSCULAR | Status: AC
  Filled 2023-02-19: qty 2

## 2023-02-19 MED ORDER — ACETAMINOPHEN 325 MG PO TABS
325.0000 mg | ORAL_TABLET | ORAL | Status: DC | PRN
  Filled 2023-02-19: qty 2

## 2023-02-19 MED ORDER — MIDAZOLAM HCL 5 MG/5ML IJ SOLN
INTRAMUSCULAR | Status: AC
  Filled 2023-02-19: qty 5

## 2023-02-19 MED ORDER — ONDANSETRON HCL 4 MG/2ML IJ SOLN: 4.0000 mg | Freq: Four times a day (QID) | INTRAMUSCULAR | Status: DC | PRN

## 2023-02-19 MED ORDER — SPIRONOLACTONE 12.5 MG HALF TABLET
12.5000 mg | ORAL_TABLET | Freq: Every day | ORAL | Status: DC
  Administered 2023-02-20: 12.5 mg via ORAL
  Filled 2023-02-19: qty 1

## 2023-02-19 MED ORDER — CEFAZOLIN SODIUM-DEXTROSE 1-4 GM/50ML-% IV SOLN
1.0000 g | Freq: Four times a day (QID) | INTRAVENOUS | Status: AC
  Administered 2023-02-19 – 2023-02-20 (×3): 1 g via INTRAVENOUS
  Filled 2023-02-19 (×3): qty 50

## 2023-02-19 MED ORDER — MIDAZOLAM HCL 5 MG/5ML IJ SOLN
INTRAMUSCULAR | Status: DC | PRN
  Administered 2023-02-19: 1 mg via INTRAVENOUS

## 2023-02-19 MED ORDER — SODIUM CHLORIDE 0.9 % IV SOLN: INTRAVENOUS | Status: DC

## 2023-02-19 MED ORDER — IOHEXOL 350 MG/ML SOLN
INTRAVENOUS | Status: DC | PRN
  Administered 2023-02-19: 5 mL

## 2023-02-19 MED ORDER — LOSARTAN POTASSIUM 25 MG PO TABS
25.0000 mg | ORAL_TABLET | Freq: Every day | ORAL | Status: DC
  Administered 2023-02-20: 25 mg via ORAL
  Filled 2023-02-19: qty 1

## 2023-02-19 MED ORDER — DAPAGLIFLOZIN PROPANEDIOL 10 MG PO TABS
10.0000 mg | ORAL_TABLET | Freq: Every day | ORAL | Status: DC
  Administered 2023-02-20: 10 mg via ORAL
  Filled 2023-02-19 (×2): qty 1

## 2023-02-19 SURGICAL SUPPLY — 17 items
BALLN ATTAIN 80 (BALLOONS) ×1
BALLOON ATTAIN 80 (BALLOONS) IMPLANT
CABLE SURGICAL S-101-97-12 (CABLE) ×1 IMPLANT
CATH ATTAIN COM SURV 6250V-EH (CATHETERS) IMPLANT
CATH RIGHTSITE C315HIS02 (CATHETERS) IMPLANT
IPG PACE AZUR XT DR MRI W1DR01 (Pacemaker) IMPLANT
LEAD CAPSURE NOVUS 5076-58CM (Lead) IMPLANT
LEAD SELECT SECURE 3830 383069 (Lead) IMPLANT
PACE AZURE XT DR MRI W1DR01 (Pacemaker) ×1 IMPLANT
PAD DEFIB RADIO PHYSIO CONN (PAD) ×1 IMPLANT
SELECT SECURE 3830 383069 (Lead) ×1 IMPLANT
SHEATH 7FR PRELUDE SNAP 13 (SHEATH) IMPLANT
SHEATH 9.5FR PRELUDE SNAP 13 (SHEATH) IMPLANT
SLITTER 6232ADJ (MISCELLANEOUS) IMPLANT
TRAY PACEMAKER INSERTION (PACKS) ×1 IMPLANT
WIRE ACUITY WHISPER EDS 4648 (WIRE) IMPLANT
WIRE HI TORQ VERSACORE-J 145CM (WIRE) IMPLANT

## 2023-02-19 NOTE — Interval H&P Note (Signed)
History and Physical Interval Note:  02/19/2023 3:33 PM  [facility] Collin Knight.  has presented today for surgery, with the diagnosis of hf.  The various methods of treatment have been discussed with the patient and family. After consideration of risks, benefits and other options for treatment, the patient has consented to  Procedure(s): BIV PACEMAKER INSERTION CRT-P (N/A) as a surgical intervention.  The patient's history has been reviewed, patient examined, no change in status, stable for surgery.  I have reviewed the patient's chart and labs.  Questions were answered to the patient's satisfaction.     Chasey Dull T Yousra Ivens

## 2023-02-20 ENCOUNTER — Encounter (HOSPITAL_COMMUNITY): Payer: Self-pay | Admitting: Cardiology

## 2023-02-20 ENCOUNTER — Ambulatory Visit (HOSPITAL_COMMUNITY): Payer: Medicare Other

## 2023-02-20 DIAGNOSIS — F1010 Alcohol abuse, uncomplicated: Secondary | ICD-10-CM | POA: Diagnosis not present

## 2023-02-20 DIAGNOSIS — I495 Sick sinus syndrome: Secondary | ICD-10-CM | POA: Diagnosis not present

## 2023-02-20 DIAGNOSIS — I4819 Other persistent atrial fibrillation: Secondary | ICD-10-CM | POA: Diagnosis not present

## 2023-02-20 DIAGNOSIS — I5022 Chronic systolic (congestive) heart failure: Secondary | ICD-10-CM | POA: Diagnosis not present

## 2023-02-20 MED ORDER — WARFARIN SODIUM 3 MG PO TABS: 3.0000 mg | ORAL_TABLET | Freq: Every day | ORAL | Status: DC

## 2023-02-20 MED ORDER — ACETAMINOPHEN 325 MG PO TABS: 325.0000 mg | ORAL_TABLET | ORAL | Status: DC | PRN

## 2023-02-20 NOTE — TOC Transition Note (Signed)
Transition of Care [facility]) - CM/SW Discharge Note   Patient Details  Name: [facility]. MRN: [id number] Date of Birth: 1946/04/05  Transition of Care [facility]) CM/SW Contact:  Leone Haven, RN Phone Number: 02/20/2023, 10:41 AM   Clinical Narrative:    Patient is for dc today, has no needs.          Patient Goals and CMS Choice      Discharge Placement                         Discharge Plan and Services Additional resources added to the After Visit Summary for                                       Social Determinants of Health (SDOH) Interventions SDOH Screenings   Depression (PHQ2-9): Low Risk  (12/19/2021)  Tobacco Use: Low Risk  (02/20/2023)     Readmission Risk Interventions     No data to display

## 2023-02-20 NOTE — Discharge Instructions (Addendum)
After Your Pacemaker   You have a Medtronic Pacemaker  ACTIVITY Do not lift your arm above shoulder height for 1 week after your procedure. After 7 days, you may progress as below.  You should remove your sling 24 hours after your procedure, unless otherwise instructed by your provider.     Wednesday Feb 27, 2023  Thursday Feb 28, 2023 Friday Mar 01, 2023 Saturday Mar 02, 2023   Do not lift, push, pull, or carry anything over 10 pounds with the affected arm until 6 weeks (Wednesday April 03, 2023 ) after your procedure.   You may drive AFTER your wound check, unless you have been told otherwise by your provider.   Ask your healthcare provider when you can go back to work   INCISION/Dressing Resume your coumadin on Friday 5/3  If large square, outer bandage is left in place, this can be removed after 24 hours from your procedure. Do not remove steri-strips or glue as below.   If a PRESSURE DRESSING (a bulky dressing that usually goes up over your shoulder) was applied or left in place, please follow instructions given by your provider on when to return to have this removed.   Monitor your Pacemaker site for redness, swelling, and drainage. Call the device clinic at [phone] if you experience these symptoms or fever/chills.  If your incision is sealed with Steri-strips or staples, you may shower 7 days after your procedure or when told by your provider. Do not remove the steri-strips or let the shower hit directly on your site. You may wash around your site with soap and water.    If you were discharged in a sling, please do not wear this during the day more than 48 hours after your surgery unless otherwise instructed. This may increase the risk of stiffness and soreness in your shoulder.   Avoid lotions, ointments, or perfumes over your incision until it is well-healed.  You may use a hot tub or a pool AFTER your wound check appointment if the incision is completely  closed.  Pacemaker Alerts:  Some alerts are vibratory and others beep. These are NOT emergencies. Please call our office to let us know. If this occurs at night or on weekends, it can wait until the next business day. Send a remote transmission.  If your device is capable of reading fluid status (for heart failure), you will be offered monthly monitoring to review this with you.   DEVICE MANAGEMENT Remote monitoring is used to monitor your pacemaker from home. This monitoring is scheduled every 91 days by our office. It allows [place] to keep an eye on the functioning of your device to ensure it is working properly. You will routinely see your Electrophysiologist annually (more often if necessary).   You should receive your ID card for your new device in 4-8 weeks. Keep this card with you at all times once received. Consider wearing a medical alert bracelet or necklace.  Your Pacemaker may be MRI compatible. This will be discussed at your next office visit/wound check.  You should avoid contact with strong electric or magnetic fields.   Do not use amateur (ham) radio equipment or electric (arc) welding torches. MP3 player headphones with magnets should not be used. Some devices are safe to use if held at least 12 inches (30 cm) from your Pacemaker. These include power tools, lawn mowers, and speakers. If you are unsure if something is safe to use, ask your health care provider.  When  using your cell phone, hold it to the ear that is on the opposite side from the Pacemaker. Do not leave your cell phone in a pocket over the Pacemaker.  You may safely use electric blankets, heating pads, computers, and microwave ovens.  Call the office right away if: You have chest pain. You feel more short of breath than you have felt before. You feel more light-headed than you have felt before. Your incision starts to open up.  This information is not intended to replace advice given to you by your health care  provider. Make sure you discuss any questions you have with your health care provider.

## 2023-02-20 NOTE — Plan of Care (Signed)

## 2023-02-20 NOTE — Care Plan (Signed)
Patient stated Dr. Steffanie Dunn wants him to start taking 1000mg  Tylenol PO Q8 hrs. RN secure chatted Dr. Lalla Brothers to let him know RN cannot give medication or print off those without an order from Dr.

## 2023-02-20 NOTE — Discharge Summary (Signed)
ELECTROPHYSIOLOGY PROCEDURE DISCHARGE SUMMARY    Patient ID: Collin Knight.,  MRN: [id number], DOB/AGE: 27-Sep-1946 77 y.o.  Admit date: 02/19/2023 Discharge date: 02/20/2023  Primary Care Physician: Jerl Mina, MD  Primary Cardiologist: Dr. Darrold Junker Electrophysiologist: Dr. Lalla Brothers   Primary Discharge Diagnosis:  SSS status post pacemaker implantation this admission Chronic systolic CHF  Secondary Discharge Diagnosis:  Persistent atrial fibrillation ETOH use  No Known Allergies   Procedures This Admission:  1.  Implantation of a Medtronic Dual chamber PPM on 4/30 by Dr. Lalla Brothers. The patient received a Medtronic Azure XT DR [id number]  with a Medtronic CapSureFix Novus 5076 right atrial lead and aMedtronic SelectSure 3830 right ventricular lead in the left bundle area.  There were no immediate post procedure complications.   2. De-activation of a previously implanted Micra AV MDT Leadless PPM.   3.  CXR on 02/20/2023 demonstrated no pneumothorax status post device implantation.       Brief HPI: Collin Knight. is a 77 y.o. male was referred to electrophysiology in the outpatient setting for  consideration of PPM upgrade in the setting of SSS, CHF, and increased RV pacing in the setting of prior Micra AV.  Past medical history includes above.  The patient has had pacing induced cardiomyopathy and symptomatic sinus node dysfunction without reversible causes identified.  Risks, benefits, and alternatives to PPM implantation were reviewed with the patient who wished to proceed.   Hospital Course:  The patient was admitted and underwent implantation of a Medtronic dual chamber PPM with details as outlined above.  He was monitored on telemetry overnight which demonstrated appropriate pacing.  Left chest was without hematoma or ecchymosis.  The device was interrogated and found to be functioning normally.  CXR was obtained and demonstrated no pneumothorax status  post device implantation.  Wound care, arm mobility, and restrictions were reviewed with the patient.  The patient was examined and considered stable for discharge to home.    Anticoagulation resumption This patient should resume their Coumadin today. I arranged for [facility] to call for an INR check next week.      Physical Exam: Vitals:   02/19/23 1940 02/20/23 0032 02/20/23 0412 02/20/23 0733  BP: (!) 145/92 139/85 130/87 (!) 144/75  Pulse: (!) 51 (!) 59 (!) 59 61  Resp: 18 18 18    Temp: 98.5 F (36.9 C) 98.5 F (36.9 C) 98.3 F (36.8 C) 98.4 F (36.9 C)  TempSrc: Oral Oral Oral Oral  SpO2:  95% 93% 94%  Weight:   70.3 kg   Height:        GEN- NAD. A&O x 3.  HEENT: Normocephalic, atraumatic Lungs- CTAB, Normal effort.  Heart- RRR, No M/G/R.  GI- Soft, NT, ND.  Extremities- No clubbing, cyanosis, or edema;  Skin- warm and dry, no rash or lesion, left chest without hematoma/ecchymosis  Discharge Medications:  Allergies as of 02/20/2023   No Known Allergies      Medication List     TAKE these medications    acetaminophen 325 MG tablet Commonly known as: TYLENOL Take 1-2 tablets (325-650 mg total) by mouth every 4 (four) hours as needed for mild pain.   amiodarone 100 MG tablet Commonly known as: PACERONE Take 1 tablet (100 mg total) by mouth daily.   Co Q 10 100 MG Caps Take 100 mg by mouth daily.   dapagliflozin propanediol 10 MG Tabs tablet Commonly known as: Farxiga Take 1 tablet (10 mg total)  by mouth daily before breakfast.   diazepam 5 MG tablet Commonly known as: VALIUM Take 1 tablet by mouth every 6 (six) hours as needed for anxiety.   furosemide 40 MG tablet Commonly known as: Lasix Take 1 tablet (40 mg total) by mouth daily. What changed:  when to take this reasons to take this   losartan 25 MG tablet Commonly known as: COZAAR TAKE 1 TABLET (25 MG TOTAL) BY MOUTH DAILY.   spironolactone 25 MG tablet Commonly known as:  ALDACTONE Take 0.5 tablets (12.5 mg total) by mouth daily.   vitamin B-12 500 MCG tablet Commonly known as: CYANOCOBALAMIN Take 500 mcg by mouth daily.   Vitamin D3 50 MCG (2000 UT) capsule Take 2,000 Units by mouth daily.   warfarin 3 MG tablet Commonly known as: COUMADIN Take 3 mg by mouth daily.        Disposition:    Follow-up Information     [facility] Follow up.   Why: They will call you for an appointment to have you INR checked the week of 5/7 Contact information: [address] [place] [address] [phone]         [facility] Follow up.   Specialty: Cardiology Why: on 5/15 at 1120 for wound check Contact information: [address], [address] [id number] mc [place] [address] [phone]                Duration of Discharge Encounter: Greater than 30 minutes including physician time.  Dustin Flock, PA-C  02/20/2023 9:14 AM

## 2023-03-05 NOTE — Progress Notes (Signed)
Prescription refill faxed to [facility] in response to missing information request.

## 2023-03-06 ENCOUNTER — Ambulatory Visit: Payer: Medicare Other | Attending: Cardiovascular Disease

## 2023-03-06 DIAGNOSIS — I495 Sick sinus syndrome: Secondary | ICD-10-CM | POA: Diagnosis not present

## 2023-03-06 LAB — CUP PACEART INCLINIC DEVICE CHECK
Battery Remaining Longevity: 148 mo
Battery Voltage: 3.21 V
Brady Statistic AP VP Percent: 0.04 %
Brady Statistic AP VS Percent: 99.61 %
Brady Statistic AS VP Percent: 0 %
Brady Statistic AS VS Percent: 0.37 %
Brady Statistic RA Percent Paced: 95.95 %
Brady Statistic RV Percent Paced: 0.36 %
Date Time Interrogation Session: 20240515131214
Implantable Lead Connection Status: 753985
Implantable Lead Connection Status: 753985
Implantable Lead Implant Date: 20240430
Implantable Lead Implant Date: 20240430
Implantable Lead Location: 753859
Implantable Lead Location: 753860
Implantable Lead Model: 3830
Implantable Lead Model: 5076
Implantable Pulse Generator Implant Date: 20240430
Lead Channel Impedance Value: 399 Ohm
Lead Channel Impedance Value: 418 Ohm
Lead Channel Impedance Value: 570 Ohm
Lead Channel Impedance Value: 589 Ohm
Lead Channel Pacing Threshold Amplitude: 0.625 V
Lead Channel Pacing Threshold Amplitude: 0.75 V
Lead Channel Pacing Threshold Pulse Width: 0.4 ms
Lead Channel Pacing Threshold Pulse Width: 0.4 ms
Lead Channel Sensing Intrinsic Amplitude: 2.25 mV
Lead Channel Sensing Intrinsic Amplitude: 2.25 mV
Lead Channel Sensing Intrinsic Amplitude: 24.125 mV
Lead Channel Sensing Intrinsic Amplitude: 28.875 mV
Lead Channel Setting Pacing Amplitude: 3.5 V
Lead Channel Setting Pacing Amplitude: 3.5 V
Lead Channel Setting Pacing Pulse Width: 0.4 ms
Lead Channel Setting Sensing Sensitivity: 0.9 mV
Zone Setting Status: 755011
Zone Setting Status: 755011

## 2023-03-06 NOTE — Progress Notes (Signed)
Atrially dependent.   Wound check appointment. Steri-strips removed. Wound without redness or edema. Incision edges approximated, wound well healed. Normal device function. Thresholds, sensing, and impedances consistent with implant measurements. Device programmed at 3.5V/auto capture programmed on for extra safety margin until 3 month visit. Histogram distribution appropriate for patient and level of activity. Atrial fibrillation noted-4% burden.  +amiodarone/Eliquis. Patient educated about wound care, arm mobility, lifting restrictions. ROV in 3 months with implanting physician. Pt requesting remote monitoring to be followed by Dr. Lalla Brothers.  Will schedule follow up.

## 2023-03-06 NOTE — Patient Instructions (Signed)

## 2023-03-24 NOTE — Progress Notes (Incomplete)
ADVANCED HF CLINIC CONSULT NOTE  Referring Provider: Clarisa Kindred, NP  Primary Care: Jerl Mina, MD Primary Cardiologist: Gillermo Murdoch  HPI:  Collin Knight. is a 77 y.o. male with medical history of PAF, lymphocytic colitis, SSS (s/p of Micra leadless pacemaker in 12/21), alcohol abuse, depression with anxiety and chronic systolic heart failure.   Referred by Brien Few NP for further evaluation of his HF  Admitted in 1/23 with CP and SOB. Found to be in AF with RVR. Echo 11/22/21 EF 55%-> 20-25%. Started on amiodarone.    Coronary CTA was performed 12/25/2021 with nonobstructive CAD: moderate RCA stenosis FFR 0.85, moderate proximal mid LAD stenosis FFR 0.84   Has been on amiodarone for rhythm control   Seen by Ms. Hackney in the HF Clinic in 12/23. Started Entresto. Says it made him feel terrible. He got blurry-eyed and light-headed and fatigued.   I saw him in 12/23. Felt EF down to previous AF with RVR or possible RV pacing. When I saw him HR was in 50s with 75% RV pacing so Toprol stopped.   Repeat echo 2/24 EF 25-30%   PPM interrogation 10/23  RV pacing 16.9% Underlying rhythm felt to be AF   PPM interrogation 12/23 75% RV pacing  Cutting back on wine intake now 12oz -> 8 oz of wine every night. (Skipped 3 nights last week)   At last visit I decreased amio to 100 daily to try to decrease RV pacing (suspect he will benefit from maintaining NSR long-term). Discussed referral to EP with Dr. Darrold Junker at last visit for upgraded to BiV pacing system.   Here with his wife. Says breathing is pretty good. Occasionally gets mild SOB and will take lasix. Can do almost anything he needs to do as long as he takes his time. No CP, orthopnea or PND. Compliant with meds. Gets about 1K steps per day. Working on getting 2K  PPM interrogation today 83.5%  Past Medical History:  Diagnosis Date   Anxiety    Chronic atrial fibrillation (HCC)    Chronic systolic CHF (congestive  heart failure) (HCC)    Pacemaker    SSS (sick sinus syndrome) (HCC)     Current Outpatient Medications  Medication Sig Dispense Refill   acetaminophen (TYLENOL) 325 MG tablet Take 1-2 tablets (325-650 mg total) by mouth every 4 (four) hours as needed for mild pain.     amiodarone (PACERONE) 100 MG tablet Take 1 tablet (100 mg total) by mouth daily. 30 tablet 3   Cholecalciferol (VITAMIN D3) 50 MCG (2000 UT) capsule Take 2,000 Units by mouth daily.     Coenzyme Q10 (CO Q 10) 100 MG CAPS Take 100 mg by mouth daily.     dapagliflozin propanediol (FARXIGA) 10 MG TABS tablet Take 1 tablet (10 mg total) by mouth daily before breakfast. 90 tablet 3   diazepam (VALIUM) 5 MG tablet Take 1 tablet by mouth every 6 (six) hours as needed for anxiety.     furosemide (LASIX) 40 MG tablet Take 1 tablet (40 mg total) by mouth daily. (Patient taking differently: Take 40 mg by mouth daily as needed for fluid or edema.) 30 tablet 0   losartan (COZAAR) 25 MG tablet TAKE 1 TABLET (25 MG TOTAL) BY MOUTH DAILY. 90 tablet 1   spironolactone (ALDACTONE) 25 MG tablet Take 0.5 tablets (12.5 mg total) by mouth daily. 15 tablet 3   vitamin B-12 (CYANOCOBALAMIN) 500 MCG tablet Take 500 mcg by mouth daily.  warfarin (COUMADIN) 3 MG tablet Take 1 tablet (3 mg total) by mouth daily.     No current facility-administered medications for this visit.    No Known Allergies    Social History   Socioeconomic History   Marital status: Married    Spouse name: Not on file   Number of children: Not on file   Years of education: Not on file   Highest education level: Not on file  Occupational History   Not on file  Tobacco Use   Smoking status: Never   Smokeless tobacco: Never  Vaping Use   Vaping Use: Never used  Substance and Sexual Activity   Alcohol use: Yes    Alcohol/week: 21.0 standard drinks of alcohol    Types: 21 Cans of beer per week    Comment: 3 beers a day   Drug use: Never   Sexual activity: Yes   Other Topics Concern   Not on file  Social History Narrative   Not on file   Social Determinants of Health   Financial Resource Strain: Not on file  Food Insecurity: Not on file  Transportation Needs: Not on file  Physical Activity: Not on file  Stress: Not on file  Social Connections: Not on file  Intimate Partner Violence: Not on file      Family History  Problem Relation Age of Onset   Stroke Mother    Cancer - Lung Father     There were no vitals filed for this visit.   ECG with severe sinus bradycardia and RV pacing at 50.   PHYSICAL EXAM: General:  Well appearing. No resp difficulty HEENT: normal Neck: supple. no JVD. Carotids 2+ bilat; no bruits. No lymphadenopathy or thryomegaly appreciated. Cor: PMI nondisplaced. Regular rate & rhythm. No rubs, gallops or murmurs. Lungs: clear Abdomen: soft, nontender, nondistended. No hepatosplenomegaly. No bruits or masses. Good bowel sounds. Extremities: no cyanosis, clubbing, rash, edema Neuro: alert & orientedx3, cranial nerves grossly intact. moves all 4 extremities w/o difficulty. Affect pleasant   ASSESSMENT & PLAN:   1. Chronic HFrEF  - Echo 2/23 EF 55% -> 20-25% in setting of AF with RVR - Coronary CT 3/23 non-obstructive CAD - NYHA II-III - Unable to tolerate Entresto. Continue losartan 25  - Toprol stopped 12/23 with bradycardia and frequent RV pacing  - Continue Farxiga 10 - Start spiro 12.5 labs today and 1 week - I saw him in 12/23. Felt EF initially down (1/23) due to AF with RVR. 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 I d/w Dr. Darrold Junker at last visit  2. PAF  - Previously on amiodarone 200 mg daily - Zio with predominantly RV pacing at 50  - At last visit I decreased amio to 100 daily to try to  decrease RV pacing (suspect he will benefit from maintaining NSR long-term). Discussed referral to EP with Dr. Darrold Junker at last visit - Continue warfarin. ? Switch to DOAC  3. SSS - s/p Micra leadless pacer - Pacer interrogated today as above. 84% RV pacing - Plan as above.   4. ETOH abuse  - he is now cutting back but requiring valium for increased anxiety - asked him to discuss further with PCP regarding counseling and possible SSRI/SSNI (  watching QT carefully)    Arvilla Meres, MD  11:26 PM

## 2023-03-25 ENCOUNTER — Ambulatory Visit (HOSPITAL_BASED_OUTPATIENT_CLINIC_OR_DEPARTMENT_OTHER): Payer: Medicare Other | Admitting: Internal Medicine

## 2023-03-25 ENCOUNTER — Other Ambulatory Visit
Admission: RE | Admit: 2023-03-25 | Discharge: 2023-03-25 | Disposition: A | Discharge status: Home / Self Care | Payer: Medicare Other | Source: Ambulatory Visit | Attending: Internal Medicine | Admitting: Internal Medicine

## 2023-03-25 VITALS — BP 128/80 | HR 61 | Wt 158.0 lb

## 2023-03-25 DIAGNOSIS — I5023 Acute on chronic systolic (congestive) heart failure: Secondary | ICD-10-CM

## 2023-03-25 DIAGNOSIS — I482 Chronic atrial fibrillation, unspecified: Secondary | ICD-10-CM | POA: Diagnosis not present

## 2023-03-25 DIAGNOSIS — I495 Sick sinus syndrome: Secondary | ICD-10-CM | POA: Diagnosis not present

## 2023-03-25 LAB — BASIC METABOLIC PANEL
Anion gap: 8 (ref 5–15)
BUN: 15 mg/dL (ref 8–23)
CO2: 26 mmol/L (ref 22–32)
Calcium: 8.8 mg/dL — ABNORMAL LOW (ref 8.9–10.3)
Chloride: 105 mmol/L (ref 98–111)
Creatinine, Ser: 0.83 mg/dL (ref 0.61–1.24)
GFR, Estimated: >60 mL/min (ref >60)
Glucose, Bld: 86 mg/dL (ref 70–99)
Potassium: 4.1 mmol/L (ref 3.5–5.1)
Sodium: 139 mmol/L (ref 135–145)

## 2023-03-25 LAB — BRAIN NATRIURETIC PEPTIDE: B Natriuretic Peptide: 85.4 pg/mL (ref 0.0–100.0)

## 2023-03-25 MED ORDER — LOSARTAN POTASSIUM 25 MG PO TABS: 50.0000 mg | ORAL_TABLET | Freq: Every day | ORAL | 6 refills | Status: DC

## 2023-03-25 MED ORDER — LOSARTAN POTASSIUM 25 MG PO TABS: 50.0000 mg | ORAL_TABLET | Freq: Every day | ORAL | 3 refills | Status: DC

## 2023-03-25 MED ORDER — SPIRONOLACTONE 25 MG PO TABS: 12.5000 mg | ORAL_TABLET | Freq: Every day | ORAL | 3 refills | Status: DC

## 2023-03-25 NOTE — Patient Instructions (Signed)
Medication Changes:  Increase Losartan to 50 mg (2 tablets) daily.  Lab Work:  Labs done today, your results will be available in MyChart, we will contact you for abnormal readings.   Testing/Procedures:  Your physician has requested that you have an echocardiogram. Echocardiography is a painless test that uses sound waves to create images of your heart. It provides your doctor with information about the size and shape of your heart and how well your heart's chambers and valves are working. This procedure takes approximately one hour. There are no restrictions for this procedure. Please do NOT wear cologne, perfume, aftershave, or lotions (deodorant is allowed). Please arrive 15 minutes prior to your appointment time.   Special Instructions // Education:  Do the following things EVERYDAY: Weigh yourself in the morning before breakfast. Write it down and keep it in a log. Take your medicines as prescribed Eat low salt foods--Limit salt (sodium) to 2000 mg per day.  Stay as active as you can everyday Limit all fluids for the day to less than 2 liters   Follow-Up in: follow up in a month with Dr. Gala Romney.    If you have any questions or concerns before your next appointment please send [place] a message through [place] or call our office at [phone] Monday-Friday 8 am-5 pm.   If you have an urgent need after hours on the weekend please call your Primary Cardiologist or the Advanced Heart Failure Clinic in [place] at [phone].

## 2023-03-31 ENCOUNTER — Other Ambulatory Visit: Payer: Self-pay | Admitting: Internal Medicine

## 2023-04-18 ENCOUNTER — Telehealth: Payer: Self-pay

## 2023-04-18 NOTE — Telephone Encounter (Signed)
Fax received from [employer] Patient [facility] Application for patient has been approved.   Now eligible to receive Eliquis (apixaban) free-of-charge from 04/18/2023-10/22/2023.  Application case# : [id number]  BMSPAF phone #: [phone]   Pt and spouse aware, agreeable, and verbalized understanding.

## 2023-04-29 ENCOUNTER — Telehealth: Payer: Self-pay

## 2023-04-29 NOTE — Telephone Encounter (Signed)
Patient stopped by clinic during lunch and had questions regarding Farxiga and Eliquis. Stating that the he has had a brownish tint to urine since start of Farxiga and he is has also had some blood in urine, chest tightness, and dizziness since starting the Eliquis. Patient denied any chest tightness, chest pain, or shortness of breath today.   Please advise.

## 2023-04-30 ENCOUNTER — Ambulatory Visit
Admission: RE | Admit: 2023-04-30 | Discharge: 2023-04-30 | Disposition: A | Discharge status: Home / Self Care | Payer: Medicare Other | Source: Ambulatory Visit | Attending: Internal Medicine | Admitting: Internal Medicine

## 2023-04-30 DIAGNOSIS — I081 Rheumatic disorders of both mitral and tricuspid valves: Secondary | ICD-10-CM | POA: Insufficient documentation

## 2023-04-30 DIAGNOSIS — I5023 Acute on chronic systolic (congestive) heart failure: Secondary | ICD-10-CM | POA: Diagnosis present

## 2023-04-30 DIAGNOSIS — Z950 Presence of cardiac pacemaker: Secondary | ICD-10-CM | POA: Diagnosis not present

## 2023-04-30 DIAGNOSIS — I482 Chronic atrial fibrillation, unspecified: Secondary | ICD-10-CM | POA: Diagnosis not present

## 2023-04-30 DIAGNOSIS — I341 Nonrheumatic mitral (valve) prolapse: Secondary | ICD-10-CM

## 2023-04-30 HISTORY — DX: Nonrheumatic mitral (valve) prolapse: I34.1

## 2023-04-30 LAB — ECHOCARDIOGRAM COMPLETE
AR max vel: 2.54 cm2
AV Area VTI: 2.34 cm2
AV Area mean vel: 2.14 cm2
AV Mean grad: 3 mmHg
AV Peak grad: 4.3 mmHg
Ao pk vel: 1.04 m/s
Area-P 1/2: 2.62 cm2
Calc EF: 54.2 %
MV VTI: 1.82 cm2
S' Lateral: 3.5 cm
Single Plane A2C EF: 50.9 %
Single Plane A4C EF: 57.7 %

## 2023-04-30 NOTE — Progress Notes (Signed)
*  PRELIMINARY RESULTS* Echocardiogram 2D Echocardiogram has been performed.  Collin Knight 04/30/2023, 11:24 AM

## 2023-05-07 ENCOUNTER — Other Ambulatory Visit
Admission: RE | Admit: 2023-05-07 | Discharge: 2023-05-07 | Disposition: A | Discharge status: Home / Self Care | Payer: Medicare Other | Source: Ambulatory Visit | Attending: Internal Medicine | Admitting: Internal Medicine

## 2023-05-07 ENCOUNTER — Ambulatory Visit (HOSPITAL_BASED_OUTPATIENT_CLINIC_OR_DEPARTMENT_OTHER): Payer: Medicare Other | Admitting: Internal Medicine

## 2023-05-07 ENCOUNTER — Encounter: Payer: Self-pay | Admitting: Internal Medicine

## 2023-05-07 VITALS — BP 140/90 | HR 94 | Wt 159.2 lb

## 2023-05-07 DIAGNOSIS — F1010 Alcohol abuse, uncomplicated: Secondary | ICD-10-CM | POA: Diagnosis not present

## 2023-05-07 DIAGNOSIS — Z950 Presence of cardiac pacemaker: Secondary | ICD-10-CM

## 2023-05-07 DIAGNOSIS — I5023 Acute on chronic systolic (congestive) heart failure: Secondary | ICD-10-CM | POA: Insufficient documentation

## 2023-05-07 DIAGNOSIS — I5020 Unspecified systolic (congestive) heart failure: Secondary | ICD-10-CM | POA: Diagnosis present

## 2023-05-07 DIAGNOSIS — I5022 Chronic systolic (congestive) heart failure: Secondary | ICD-10-CM

## 2023-05-07 DIAGNOSIS — R5382 Chronic fatigue, unspecified: Secondary | ICD-10-CM

## 2023-05-07 LAB — CBC
HCT: 43 % (ref 39.0–52.0)
Hemoglobin: 14.4 g/dL (ref 13.0–17.0)
MCH: 35 pg — ABNORMAL HIGH (ref 26.0–34.0)
MCHC: 33.5 g/dL (ref 30.0–36.0)
MCV: 104.6 fL — ABNORMAL HIGH (ref 80.0–100.0)
Platelets: 245 10*3/uL (ref 150–400)
RBC: 4.11 MIL/uL — ABNORMAL LOW (ref 4.22–5.81)
RDW: 13.1 % (ref 11.5–15.5)
WBC: 5.7 10*3/uL (ref 4.0–10.5)
nRBC: 0 % (ref 0.0–0.2)

## 2023-05-07 LAB — COMPREHENSIVE METABOLIC PANEL
ALT: 14 U/L (ref 0–44)
AST: 18 U/L (ref 15–41)
Albumin: 4.2 g/dL (ref 3.5–5.0)
Alkaline Phosphatase: 51 U/L (ref 38–126)
Anion gap: 8 (ref 5–15)
BUN: 14 mg/dL (ref 8–23)
CO2: 22 mmol/L (ref 22–32)
Calcium: 8.7 mg/dL — ABNORMAL LOW (ref 8.9–10.3)
Chloride: 104 mmol/L (ref 98–111)
Creatinine, Ser: 0.85 mg/dL (ref 0.61–1.24)
GFR, Estimated: >60 mL/min (ref >60)
Glucose, Bld: 95 mg/dL (ref 70–99)
Potassium: 4.1 mmol/L (ref 3.5–5.1)
Sodium: 134 mmol/L — ABNORMAL LOW (ref 135–145)
Total Bilirubin: 0.9 mg/dL (ref 0.3–1.2)
Total Protein: 7.3 g/dL (ref 6.5–8.1)

## 2023-05-07 LAB — TSH: TSH: 0.734 u[IU]/mL (ref 0.350–4.500)

## 2023-05-07 LAB — T4, FREE: Free T4: 0.99 ng/dL (ref 0.61–1.12)

## 2023-05-07 NOTE — Patient Instructions (Addendum)
  Lab Work:  Labs done today, your results will be available in MyChart, we will contact you for abnormal readings.   Testing/Procedures:  Your provider has recommended that you have a home sleep study ([facility]amar Test).  We have provided you with the equipment in our office today. Please go ahead and download the app. DO NOT OPEN OR TAMPER WITH THE BOX UNTIL WE ADVISE YOU TO DO SO. Once insurance has approved the test our office will call you with PIN number and approval to proceed with testing. Once you have completed the test you just dispose of the equipment, the information is automatically uploaded to [place] via blue-tooth technology. If your test is positive for sleep apnea and you need a home CPAP machine you will be contacted by Dr Norris Cross office [facility]) to set this up.    Referrals:  You have been referred to [facility] with Dr. Cordella Register, for Hematuria. You will receive a call to schedule this appointment.    [facility] [address]  [place] [address] Phone: [phone]  Special Instructions // Education:  Do the following things EVERYDAY: Weigh yourself in the morning before breakfast. Write it down and keep it in a log. Take your medicines as prescribed Eat low salt foods--Limit salt (sodium) to 2000 mg per day.  Stay as active as you can everyday Limit all fluids for the day to less than 2 liters   Follow-Up in: please call our clinic if you need to see the doctor.     If you have any questions or concerns before your next appointment please send [place] a message through [place] or call our office at [phone] Monday-Friday 8 am-5 pm.   If you have an urgent need after hours on the weekend please call your Primary Cardiologist or the Advanced Heart Failure Clinic in [place] at [phone].

## 2023-05-07 NOTE — Progress Notes (Signed)
Height:  5'10    Weight: 159 lb BMI: 22.84  Today's Date: 05/07/23  STOP BANG RISK ASSESSMENT S (snore) Have you been told that you snore?     YES   T (tired) Are you often tired, fatigued, or sleepy during the day?   YES  O (obstruction) Do you stop breathing, choke, or gasp during sleep? NO   P (pressure) Do you have or are you being treated for high blood pressure? YES   B (BMI) Is your body index greater than 35 kg/m? NO   A (age) Are you 79 years old or older? YES   N (neck) Do you have a neck circumference greater than 16 inches?   NO   G (gender) Are you a male? YES   TOTAL STOP/BANG "YES" ANSWERS 5                                                                       For Office Use Only              Procedure Order Form    YES to 3+ Stop Bang questions OR two clinical symptoms - patient qualifies for WatchPAT (CPT 95800)      Clinical Notes: Will consult Sleep Specialist and refer for management of therapy due to patient increased risk of Sleep Apnea. Ordering a sleep study due to the following two clinical symptoms: Excessive daytime sleepiness G47.10 /Loud snoring R06.83

## 2023-05-07 NOTE — Progress Notes (Signed)
ADVANCED HF CLINIC CONSULT NOTE  Referring Provider: Clarisa Kindred, NP  Primary Care: Jerl Mina, MD Primary Cardiologist: Gillermo Murdoch  HPI:  Collin Knight. is a 77 y.o. male with PAF, lymphocytic colitis, SSS (s/p of Micra leadless pacemaker in 12/21), alcohol abuse, depression/anxiety and chronic systolic heart failure.   Referred by Clarisa Kindred NP for further evaluation of his HF  Admitted in 1/23 with CP and SOB. Found to be in AF with RVR. Echo 11/22/21 EF 55%-> 20-25%. Started on amiodarone.    Coronary CTA was performed 12/25/2021 with nonobstructive CAD: moderate RCA stenosis FFR 0.85, moderate proximal mid LAD stenosis FFR 0.84   Has been on amiodarone for rhythm control   Seen by Ms. Hackney in the HF Clinic in 12/23. Started Entresto. Says it made him feel terrible. He got blurry-eyed and lightheaded and fatigued.   I saw him in 12/23. Felt EF down to previous AF with RVR or possible RV pacing. When I saw him HR was in 50s with 75% RV pacing so Toprol stopped.   Echo 2/24 EF 25-30%   PPM interrogation 12/23 75% RV pacing  Discussed referral to EP with Dr. Darrold Junker at last visit for upgraded to BiV pacing system.   Underwent dual chamber PPM with LBBB area lead on 02/19/23  Echo 04/30/23 EF 50-55% mild MR   Here with his wife. Says he feels better. But he reports that he remains tired. No CP or SOB. No edema, orthopnea or PND.  PPM interrogation AP (with intact AV conduction) 98.9% No AF   Past Medical History:  Diagnosis Date   Anxiety    Chronic atrial fibrillation (HCC)    Chronic systolic CHF (congestive heart failure) (HCC)    Pacemaker    SSS (sick sinus syndrome) (HCC)     Current Outpatient Medications  Medication Sig Dispense Refill   amiodarone (PACERONE) 100 MG tablet TAKE 1 TABLET BY MOUTH EVERY DAY 90 tablet 3   apixaban (ELIQUIS) 5 MG TABS tablet Take 5 mg by mouth 2 (two) times daily.     dapagliflozin propanediol (FARXIGA) 10 MG  TABS tablet Take 1 tablet (10 mg total) by mouth daily before breakfast. 90 tablet 3   diazepam (VALIUM) 5 MG tablet Take 1 tablet by mouth every 6 (six) hours as needed for anxiety.     furosemide (LASIX) 40 MG tablet Take 1 tablet (40 mg total) by mouth daily. (Patient taking differently: Take 40 mg by mouth daily as needed for fluid or edema.) 30 tablet 0   losartan (COZAAR) 25 MG tablet Take 2 tablets (50 mg total) by mouth daily. 180 tablet 3   spironolactone (ALDACTONE) 25 MG tablet Take 0.5 tablets (12.5 mg total) by mouth daily. 45 tablet 3   acetaminophen (TYLENOL) 325 MG tablet Take 1-2 tablets (325-650 mg total) by mouth every 4 (four) hours as needed for mild pain. (Patient not taking: Reported on 03/25/2023)     Cholecalciferol (VITAMIN D3) 50 MCG (2000 UT) capsule Take 2,000 Units by mouth daily. (Patient not taking: Reported on 05/07/2023)     Coenzyme Q10 (CO Q 10) 100 MG CAPS Take 100 mg by mouth daily. (Patient not taking: Reported on 05/07/2023)     vitamin B-12 (CYANOCOBALAMIN) 500 MCG tablet Take 500 mcg by mouth daily. (Patient not taking: Reported on 05/07/2023)     No current facility-administered medications for this visit.    No Known Allergies    Social History  Socioeconomic History   Marital status: Married    Spouse name: Not on file   Number of children: Not on file   Years of education: Not on file   Highest education level: Not on file  Occupational History   Not on file  Tobacco Use   Smoking status: Never   Smokeless tobacco: Never  Vaping Use   Vaping status: Never Used  Substance and Sexual Activity   Alcohol use: Yes    Alcohol/week: 21.0 standard drinks of alcohol    Types: 21 Cans of beer per week    Comment: 3 beers a day   Drug use: Never   Sexual activity: Yes  Other Topics Concern   Not on file  Social History Narrative   Not on file   Social Determinants of Health   Financial Resource Strain: Not on file  Food Insecurity: Not on  file  Transportation Needs: Not on file  Physical Activity: Not on file  Stress: Not on file  Social Connections: Not on file  Intimate Partner Violence: Not on file      Family History  Problem Relation Age of Onset   Stroke Mother    Cancer - Lung Father     Vitals:   05/07/23 1121  BP: (!) 140/90  Pulse: 94  SpO2: (!) 63%  Weight: 159 lb 3.2 oz (72.2 kg)    PHYSICAL EXAM: General:  Well appearing. No resp difficulty HEENT: normal Neck: supple. no JVD. Carotids 2+ bilat; no bruits. No lymphadenopathy or thryomegaly appreciated. Cor: PMI nondisplaced. Regular rate & rhythm. No rubs, gallops or murmurs. Lungs: clear Abdomen: soft, nontender, nondistended. No hepatosplenomegaly. No bruits or masses. Good bowel sounds. Extremities: no cyanosis, clubbing, rash, edema Neuro: alert & orientedx3, cranial nerves grossly intact. moves all 4 extremities w/o difficulty. Affect pleasant  ASSESSMENT & PLAN:   1. Chronic HFrEF  - Echo 2/23 EF 55% -> 20-25% in setting of AF with RVR - Coronary CT 3/23 non-obstructive CAD - Much improved with CRT-P NYHA I-II - Echo 04/30/23 EF 50-55% mild MR  - Unable to tolerate Entresto. Increase losartan 25 -> 50 daily - Toprol stopped 12/23 with bradycardia and frequent RV pacing  - Continue Farxiga 10 - Continue spiro 12.5 labs - I saw him in 12/23. Felt EF initially down (1/23) due to AF with RVR. 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 Clinically much improved and EF has normalized. Doubt fatigue is related to HF - F/u with Dr. Darrold Junker.   2. PAF  - Remains in NSR on amio 100  - He will follow Dr. Darrold Junker -Con tinue Eliquis 5 bid  3. SSS - s/p Micra leadless pacer with subsequent upgrade to CRT-P - Now s/p CRT-P. Followed by Dr.  Lalla Brothers. - PPM interrogation AP (with intact AV conduction) 98.9% No AF - done personally in clinic  4. ETOH abuse  - has cut back - asked him to discuss further with PCP regarding counseling and possible SSRI/SSNI (watching QT carefully)  5. Fatigue - check TFTs and sleep study  Arvilla Meres, MD  11:53 AM

## 2023-05-08 LAB — T3, FREE: T3, Free: 2.6 pg/mL (ref 2.0–4.4)

## 2023-05-29 ENCOUNTER — Encounter: Payer: Self-pay | Admitting: Urology

## 2023-05-29 ENCOUNTER — Ambulatory Visit (INDEPENDENT_AMBULATORY_CARE_PROVIDER_SITE_OTHER): Payer: Medicare Other | Admitting: Urology

## 2023-05-29 ENCOUNTER — Ambulatory Visit: Payer: Medicare Other | Admitting: Cardiology

## 2023-05-29 VITALS — BP 135/81 | HR 83 | Ht 70.0 in | Wt 153.0 lb

## 2023-05-29 DIAGNOSIS — R310 Gross hematuria: Secondary | ICD-10-CM | POA: Diagnosis not present

## 2023-05-29 LAB — URINALYSIS, COMPLETE
Bilirubin, UA: NEGATIVE
Ketones, UA: NEGATIVE
Leukocytes,UA: NEGATIVE
Nitrite, UA: NEGATIVE
Protein,UA: NEGATIVE
Specific Gravity, UA: <1.005 — ABNORMAL LOW (ref 1.005–1.030)
Urobilinogen, Ur: 0.2 mg/dL (ref 0.2–1.0)
pH, UA: 5.5 (ref 5.0–7.5)

## 2023-05-29 LAB — MICROSCOPIC EXAMINATION: RBC, Urine: >30 /hpf — AB (ref 0–2)

## 2023-05-29 NOTE — Progress Notes (Signed)
I, Collin Knight, acting as a scribe for Collin Altes, MD., have documented all relevant documentation on the behalf of Collin Altes, MD, as directed by Collin Altes, MD while in the presence of Collin Altes, MD.  05/29/2023 3:10 PM   [name] Roark Holik. 1946-08-01 [id number]  Referring provider: Dolores Patty, MD [address] [place],  [place] [address]  Chief Complaint  Patient presents with   Hematuria    HPI: [facility]. is a 77 y.o. male referred for evaluation of gross hematuria.  States he had been on Coumadin for 2-3 years and noted intermittent gross hematuria.  He was subsequently switched to Eliquis approximately 6 weeks ago and began to have trace amounts of blood on this medication.  He has had no gross hematuria over the last 5 weeks.  No bothersome lower urinary tract symptoms with the exception of frequency which he attributes to furosemide  No flank, abdominal, or pelvic pain  Denies previous history of urologic problems   PMH: Past Medical History:  Diagnosis Date   Anxiety    Chronic atrial fibrillation (HCC)    Chronic systolic CHF (congestive heart failure) (HCC)    Pacemaker    SSS (sick sinus syndrome) (HCC)     Surgical History: Past Surgical History:  Procedure Laterality Date   BIV PACEMAKER INSERTION CRT-P N/A 02/19/2023   Procedure: BIV PACEMAKER INSERTION CRT-P;  Surgeon: Lanier Prude, MD;  Location: [facility] INVASIVE CV LAB;  Service: Cardiovascular;  Laterality: N/A;   COLONOSCOPY WITH PROPOFOL N/A 09/25/2021   Procedure: COLONOSCOPY WITH PROPOFOL;  Surgeon: Jaynie Collins, DO;  Location: [facility] ENDOSCOPY;  Service: Gastroenterology;  Laterality: N/A;   EYE SURGERY     cataract both eyes   PACEMAKER LEADLESS INSERTION N/A 09/30/2020   Procedure: PACEMAKER LEADLESS INSERTION;  Surgeon: Marcina Millard, MD;  Location: [facility] INVASIVE CV LAB;  Service: Cardiovascular;  Laterality:  N/A;    Home Medications:  Allergies as of 05/29/2023   No Known Allergies      Medication List        Accurate as of May 29, 2023  3:10 PM. If you have any questions, ask your nurse or doctor.          acetaminophen 325 MG tablet Commonly known as: TYLENOL Take 1-2 tablets (325-650 mg total) by mouth every 4 (four) hours as needed for mild pain.   amiodarone 100 MG tablet Commonly known as: PACERONE TAKE 1 TABLET BY MOUTH EVERY DAY   apixaban 5 MG Tabs tablet Commonly known as: ELIQUIS Take 5 mg by mouth 2 (two) times daily.   Co Q 10 100 MG Caps Take 100 mg by mouth daily.   dapagliflozin propanediol 10 MG Tabs tablet Commonly known as: Farxiga Take 1 tablet (10 mg total) by mouth daily before breakfast.   diazepam 5 MG tablet Commonly known as: VALIUM Take 1 tablet by mouth every 6 (six) hours as needed for anxiety.   furosemide 40 MG tablet Commonly known as: Lasix Take 1 tablet (40 mg total) by mouth daily. What changed:  when to take this reasons to take this   losartan 25 MG tablet Commonly known as: COZAAR Take 2 tablets (50 mg total) by mouth daily.   spironolactone 25 MG tablet Commonly known as: ALDACTONE Take 0.5 tablets (12.5 mg total) by mouth daily.   vitamin B-12 500 MCG tablet Commonly known as: CYANOCOBALAMIN Take 500  mcg by mouth daily.   Vitamin D3 50 MCG (2000 UT) capsule Take 2,000 Units by mouth daily.        Allergies: No Known Allergies  Family History: Family History  Problem Relation Age of Onset   Stroke Mother    Cancer - Lung Father     Social History:  reports that he has never smoked. He has never used smokeless tobacco. He reports current alcohol use of about 21.0 standard drinks of alcohol per week. He reports that he does not use drugs.   Physical Exam: BP 135/81   Pulse 83   Ht 5\' 10"  (1.778 m)   Wt 153 lb (69.4 kg)   BMI 21.95 kg/m   Constitutional:  Alert and oriented, No acute  distress. HEENT: [place] AT Respiratory: Normal respiratory effort, no increased work of breathing. Psychiatric: Normal mood and affect.   Urinalysis Dipstick 3+ glucose/3+ blood, microscopy >30 RBC   Assessment & Plan:    1. Gross hematuria Persistent microhematuria at >30 RBCs.  AUA risk stratification: High We discussed the recommended evaluation of high risk hematuria which consist of CT urogram and cystoscopy.  The procedures were discussed in detail and he/she has elected to proceed with further evaluation All questions were answered CTU order placed and cystoscopy was scheduled   I have reviewed the above documentation for accuracy and completeness, and I agree with the above.   Collin Altes, MD  [facility] [address], [address] [place], [place] [address] [phone]

## 2023-05-30 ENCOUNTER — Encounter: Payer: Self-pay | Admitting: Urology

## 2023-05-30 ENCOUNTER — Encounter: Payer: Self-pay | Admitting: Cardiology

## 2023-05-30 ENCOUNTER — Ambulatory Visit: Payer: Medicare Other | Attending: Cardiology | Admitting: Cardiology

## 2023-05-30 VITALS — BP 120/62 | HR 66 | Ht 70.0 in | Wt 158.0 lb

## 2023-05-30 DIAGNOSIS — I4819 Other persistent atrial fibrillation: Secondary | ICD-10-CM | POA: Insufficient documentation

## 2023-05-30 DIAGNOSIS — Z950 Presence of cardiac pacemaker: Secondary | ICD-10-CM | POA: Insufficient documentation

## 2023-05-30 DIAGNOSIS — I5032 Chronic diastolic (congestive) heart failure: Secondary | ICD-10-CM | POA: Diagnosis present

## 2023-05-30 DIAGNOSIS — I495 Sick sinus syndrome: Secondary | ICD-10-CM | POA: Diagnosis present

## 2023-05-30 DIAGNOSIS — D6869 Other thrombophilia: Secondary | ICD-10-CM | POA: Insufficient documentation

## 2023-05-30 LAB — CUP PACEART INCLINIC DEVICE CHECK
Date Time Interrogation Session: 20240808163642
Implantable Lead Connection Status: 753985
Implantable Lead Connection Status: 753985
Implantable Lead Implant Date: 20240430
Implantable Lead Implant Date: 20240430
Implantable Lead Location: 753859
Implantable Lead Location: 753860
Implantable Lead Model: 3830
Implantable Lead Model: 5076
Implantable Pulse Generator Implant Date: 20240430

## 2023-05-30 MED ORDER — FUROSEMIDE 40 MG PO TABS: 40.0000 mg | ORAL_TABLET | ORAL | Status: AC | PRN

## 2023-05-30 NOTE — Progress Notes (Signed)
Cardiology Office Note Date:  05/30/2023  Patient ID:  [facility] DOB Mar 14, 1946, MRN [id number] PCP:  Jerl Mina, MD  Cardiologist:  Marcina Millard, MD HF cardiologist: Arvilla Meres, MD Electrophysiologist: Lanier Prude, MD    Chief Complaint: [phone] routine post-device implant follow-up  History of Present Illness: [facility]. is a 77 y.o. male with PMH notable for persis AFib, SSS s/p PPM > CRT-D, CHF, ETOH abuse ; seen today for Lanier Prude, MD for routine post 91-day device implant follow-up.  He is s/p CRT-P on 02/19/2023 by Dr. Lalla Brothers. He has a leadless PPM left in place that was deactivated. Since implant, he has followed up with Dr. Darrold Junker and Dr. Gala Romney. Updated echo 04/2023 with improved LVEF to 50-55%.   On follow-up, he states that he feels great. He is able to walk without SOB, chest pain, pressure.  Has some palpitations intermittently,  but not sustained. Diligently takes eliquis BID, no bleeding concerns. He has not needed PRN lasix in many weeks since he started farxiga.     he denies chest pain, palpitations, dyspnea, PND, orthopnea, nausea, vomiting, dizziness, syncope, edema, weight gain, or early satiety.    Device Information: Medtronic dual chamber PPM, imp 01/2023; dx SND and HFrEF RV lead is left bundle area lead  AAD History: Amiodarone  Past Medical History:  Diagnosis Date   Anxiety    Chronic atrial fibrillation (HCC)    Chronic systolic CHF (congestive heart failure) (HCC)    Pacemaker    SSS (sick sinus syndrome) (HCC)     Past Surgical History:  Procedure Laterality Date   BIV PACEMAKER INSERTION CRT-P N/A 02/19/2023   Procedure: BIV PACEMAKER INSERTION CRT-P;  Surgeon: Lanier Prude, MD;  Location: [facility] INVASIVE CV LAB;  Service: Cardiovascular;  Laterality: N/A;   COLONOSCOPY WITH PROPOFOL N/A 09/25/2021   Procedure: COLONOSCOPY WITH PROPOFOL;  Surgeon: Jaynie Collins, DO;   Location: [facility] ENDOSCOPY;  Service: Gastroenterology;  Laterality: N/A;   EYE SURGERY     cataract both eyes   PACEMAKER LEADLESS INSERTION N/A 09/30/2020   Procedure: PACEMAKER LEADLESS INSERTION;  Surgeon: Marcina Millard, MD;  Location: [facility] INVASIVE CV LAB;  Service: Cardiovascular;  Laterality: N/A;    Current Outpatient Medications  Medication Instructions   acetaminophen (TYLENOL) 325-650 mg, Oral, Every 4 hours PRN   amiodarone (PACERONE) 100 mg, Oral, Daily   apixaban (ELIQUIS) 5 mg, Oral, 2 times daily   Co Q 10 100 mg, Oral, Daily   dapagliflozin propanediol (FARXIGA) 10 mg, Oral, Daily before breakfast   diazepam (VALIUM) 5 MG tablet 1 tablet, Oral, Every 6 hours PRN   furosemide (LASIX) 40 mg, Oral, Daily   losartan (COZAAR) 50 mg, Oral, Daily   spironolactone (ALDACTONE) 12.5 mg, Oral, Daily   vitamin B-12 (CYANOCOBALAMIN) 500 mcg, Oral, Daily   Vitamin D3 2,000 Units, Oral, Daily    Social History:  The patient  reports that he has never smoked. He has never used smokeless tobacco. He reports current alcohol use of about 21.0 standard drinks of alcohol per week. He reports that he does not use drugs.   Family History:  The patient's family history includes Cancer - Lung in his father; Stroke in his mother.  ROS:  Please see the history of present illness. All other systems are reviewed and otherwise negative.   PHYSICAL EXAM:  VS:  BP 120/62 (BP Location: Left Arm, Patient Position: Sitting, Cuff Size: Normal)  Pulse 66   Ht 5\' 10"  (1.778 m)   Wt 158 lb (71.7 kg)   SpO2 96%   BMI 22.67 kg/m  BMI: There is no height or weight on file to calculate BMI.  GEN- The patient is well appearing, alert and oriented x 3 today.   Lungs- Clear to ausculation bilaterally, normal work of breathing.  Heart- Regular rate and rhythm, no murmurs, rubs or gallops Extremities- No peripheral edema, warm, dry Skin-   device pocket well-healed, no tethering   Device  interrogation done today and reviewed by myself:  Battery 13.3 years Lead thresholds, impedence, sensing stable  Thresholds at auto-capture Rare v-pacing AFib/flutter episodes noted, most appear flutter Turned on atrial therapies   EKG is not ordered. Personal review of EKG from  03/25/2023  shows:  A-paced at 67bpm; LAD        Recent Labs: 03/25/2023: B Natriuretic Peptide 85.4 05/07/2023: ALT 14; BUN 14; Creatinine, Ser 0.85; Hemoglobin 14.4; Platelets 245; Potassium 4.1; Sodium 134; TSH 0.734  No results found for requested labs within last 365 days.   CrCl cannot be calculated (Patient's most recent lab result is older than the maximum 21 days allowed.).   Wt Readings from Last 3 Encounters:  05/29/23 153 lb (69.4 kg)  05/07/23 159 lb 3.2 oz (72.2 kg)  03/25/23 158 lb (71.7 kg)     Additional studies reviewed include: Previous EP, cardiology notes.   TTE, 04/30/2023  1. Left ventricular ejection fraction, by estimation, is 50 to 55%. The left ventricle has low normal function. The left ventricle has no regional wall motion abnormalities. There is mild left ventricular hypertrophy. Left ventricular diastolic parameters are consistent with Grade I diastolic dysfunction (impaired relaxation). The average left ventricular global longitudinal strain is -17.4 %. The global longitudinal strain is normal.   2. Right ventricular systolic function is normal. The right ventricular size is normal. Tricuspid regurgitation signal is inadequate for assessing PA pressure.   3. The mitral valve is abnormal. Mild mitral valve regurgitation. No evidence of mitral stenosis. 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   4. The aortic valve is tricuspid. Aortic valve regurgitation is not visualized. No aortic stenosis is present.   5. 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   6. The inferior vena cava is normal in size with greater than 50% respiratory  variability, suggesting right atrial pressure of 3 mmHg.   Comparison(s): A prior study was performed on 12/14/2022. The left ventricular function has improved.   PPM insertion, 02/19/2023 1. Symptomatic bradycardia with a previously placed Micra leadless pacemaker 2. 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 Successful implant of a dual chamber permanent pacemaker with left bundle area lead 4. No early apparent complications.   TTE, 12/14/2022  1. Left ventricular ejection fraction, by estimation, is 25 to 30%. The left ventricle has severely decreased function. The left ventricle demonstrates global hypokinesis. The left ventricular internal cavity size was mildly dilated. There is mild left ventricular hypertrophy. Left ventricular diastolic parameters are indeterminate. The average left ventricular global longitudinal strain is -9.9 %. The global longitudinal strain is abnormal.   2. Right ventricular systolic function is normal. The right ventricular size is normal. There is normal pulmonary artery systolic pressure.   3. The mitral valve is normal in structure. Moderate mitral valve regurgitation. No evidence of mitral stenosis.   4. Tricuspid valve regurgitation is mild to moderate.  5. The aortic valve is normal in structure. Aortic valve regurgitation is mild. No aortic stenosis is present. Aortic valve mean gradient measures 2.0 mmHg.   6. There is borderline dilatation of the aortic root, measuring 39 mm.   7. The inferior vena cava is normal in size with greater than 50% respiratory variability, suggesting right atrial pressure of 3 mmHg.   ASSESSMENT AND PLAN:  #) SND #) pacemaker-induced cardiomyopathy with leadless PPM #) s/p dual chamber PPM  Recent dual chamber PPM implant with left bundle area lead after unable to implant CRT-P Device functioning well, see paceart for details  #) persis Afib Low overall afib burden On amidoarone  long-term LFT and thyroid labs stable on 04/2023 labs Continue 100mg  amiodarone daily  #) Hypercoag d/t persis afib CHA2DS2-VASc Score = 3 [CHF History: 1, HTN History: 0, Diabetes History: 0, Stroke History: 0, Vascular Disease History: 0, Age Score: 2, Gender Score: 0].  Therefore, the patient's annual risk of stroke is 3.2 %.    Stroke ppx - 5mg  eliquis, appropriately dosed No bleeding concerns  #)HFimpEF NYHA I-II symptoms Warm and dry on exam, euvolemic GDMT: farxiga, losartan 25, spiro 12.5 Diuretic: 40mg  daily, pt takes PRN (rarely)        Current medicines are reviewed at length with the patient today.   The patient does not have concerns regarding his medicines.  The following changes were made today:  none  Labs/ tests ordered today include:  No orders of the defined types were placed in this encounter.    Disposition: Follow up with Dr. Lalla Brothers or EP APP in 6 months   Signed, Sherie Don, NP  05/30/23  12:37 PM  Electrophysiology [facility]

## 2023-05-30 NOTE — Patient Instructions (Signed)
Medication Instructions:   Your physician recommends that you continue on your current medications as directed. Please refer to the Current Medication list given to you today.  *If you need a refill on your cardiac medications before your next appointment, please call your pharmacy*   Lab Work:  No lab work ordered today.  If you have labs (blood work) drawn today and your tests are completely normal, you will receive your results only by: MyChart Message (if you have MyChart) OR A paper copy in the mail If you have any lab test that is abnormal or we need to change your treatment, we will call you to review the results.   Testing/Procedures:  No testing ordered today.   Follow-Up: At [facility], you and your health needs are our priority.  As part of our continuing mission to provide you with exceptional heart care, we have created designated Provider Care Teams.  These Care Teams include your primary Cardiologist (physician) and Advanced Practice Providers (APPs -  Physician Assistants and Nurse Practitioners) who all work together to provide you with the care you need, when you need it.  We recommend signing up for the patient portal called "MyChart".  Sign up information is provided on this After Visit Summary.  MyChart is used to connect with patients for Virtual Visits (Telemedicine).  Patients are able to view lab/test results, encounter notes, upcoming appointments, etc.  Non-urgent messages can be sent to your provider as well.   To learn more about what you can do with MyChart, go to [email or web address].    Your next appointment:   6 month(s)  Provider:   Sherie Don, NP; Schedule on a Wednesday Dr. Lalla Brothers is in clinic.

## 2023-06-05 ENCOUNTER — Ambulatory Visit (INDEPENDENT_AMBULATORY_CARE_PROVIDER_SITE_OTHER): Payer: Medicare Other

## 2023-06-05 DIAGNOSIS — I495 Sick sinus syndrome: Secondary | ICD-10-CM | POA: Diagnosis not present

## 2023-06-05 LAB — CUP PACEART REMOTE DEVICE CHECK
Battery Remaining Longevity: 162 mo
Battery Voltage: 3.19 V
Brady Statistic AP VP Percent: 0.03 %
Brady Statistic AP VS Percent: 99.64 %
Brady Statistic AS VP Percent: 0 %
Brady Statistic AS VS Percent: 0.33 %
Brady Statistic RA Percent Paced: 99.79 %
Brady Statistic RV Percent Paced: 0.03 %
Date Time Interrogation Session: 20240814001059
Implantable Lead Connection Status: 753985
Implantable Lead Connection Status: 753985
Implantable Lead Implant Date: 20240430
Implantable Lead Implant Date: 20240430
Implantable Lead Location: 753859
Implantable Lead Location: 753860
Implantable Lead Model: 3830
Implantable Lead Model: 5076
Implantable Pulse Generator Implant Date: 20240430
Lead Channel Impedance Value: 342 Ohm
Lead Channel Impedance Value: 380 Ohm
Lead Channel Impedance Value: 532 Ohm
Lead Channel Impedance Value: 532 Ohm
Lead Channel Pacing Threshold Amplitude: 0.75 V
Lead Channel Pacing Threshold Amplitude: 1 V
Lead Channel Pacing Threshold Pulse Width: 0.4 ms
Lead Channel Pacing Threshold Pulse Width: 0.4 ms
Lead Channel Sensing Intrinsic Amplitude: 28 mV
Lead Channel Sensing Intrinsic Amplitude: 28 mV
Lead Channel Sensing Intrinsic Amplitude: 3.125 mV
Lead Channel Sensing Intrinsic Amplitude: 3.125 mV
Lead Channel Setting Pacing Amplitude: 1.5 V
Lead Channel Setting Pacing Amplitude: 2 V
Lead Channel Setting Pacing Pulse Width: 0.4 ms
Lead Channel Setting Sensing Sensitivity: 0.9 mV
Zone Setting Status: 755011

## 2023-06-06 ENCOUNTER — Ambulatory Visit
Admission: RE | Admit: 2023-06-06 | Discharge: 2023-06-06 | Disposition: A | Discharge status: Home / Self Care | Payer: Medicare Other | Source: Ambulatory Visit | Attending: Urology | Admitting: Urology

## 2023-06-06 DIAGNOSIS — R310 Gross hematuria: Secondary | ICD-10-CM | POA: Diagnosis present

## 2023-06-06 MED ORDER — IOHEXOL 300 MG/ML  SOLN
100.0000 mL | Freq: Once | INTRAMUSCULAR | Status: AC | PRN
  Administered 2023-06-06: 100 mL via INTRAVENOUS

## 2023-06-08 ENCOUNTER — Encounter (INDEPENDENT_AMBULATORY_CARE_PROVIDER_SITE_OTHER): Payer: Medicare Other | Admitting: Cardiology

## 2023-06-08 DIAGNOSIS — G4733 Obstructive sleep apnea (adult) (pediatric): Secondary | ICD-10-CM | POA: Diagnosis not present

## 2023-06-09 ENCOUNTER — Ambulatory Visit: Payer: Medicare Other | Attending: Internal Medicine

## 2023-06-09 DIAGNOSIS — I5023 Acute on chronic systolic (congestive) heart failure: Secondary | ICD-10-CM

## 2023-06-09 NOTE — Procedures (Signed)
   Patient Information Study Date: 06/08/2023 Patient Name: Collin Knight Patient ID: [id number] Birth Date: 09-Dec-1945 Age: 77 Gender: Male BMI: 22.7 (W=159 lb, H=5' 10'') Stopbang: 5 Referring Physician: Arvilla Meres, MD  TEST DESCRIPTION: Home sleep apnea testing was completed using the WatchPat, a Type 1 device, utilizing peripheral arterial tonometry (PAT), chest movement, actigraphy, pulse oximetry, pulse rate, body position and snore. AHI was calculated with apnea and hypopnea using valid sleep time as the denominator. RDI includes apneas, hypopneas, and RERAs. The data acquired and the scoring of sleep and all associated events were performed in accordance with the recommended standards and specifications as outlined in the AASM Manual for the Scoring of Sleep and Associated Events 2.2.0 (2015).   FINDINGS:   1. Mild Obstructive Sleep Apnea with AHI 5.8/hr.   2. No Central Sleep Apnea with pAHIc 0.7/hr.   3. Oxygen desaturations as low as 85%.   4. Mild snoring was present. O2 sats were < 88% for 1.8 min.   5. Total sleep time was 8 hrs and 44 min.   6. 6% of total sleep time was spent in REM sleep.   7. Normal sleep onset latency at 17 min.   8. Prolonged REM sleep onset latency at 133 min.   9. Total awakenings were 11.  10. Arrhythmia detection:  None  DIAGNOSIS: Mild Obstructive Sleep Apnea (G47.33)  RECOMMENDATIONS:   1. Normal study with no significant sleep disordered breathing.  2.  Healthy sleep recommendations include:  adequate nightly sleep (normal 7-9 hrs/night), avoidance of caffeine after noon and alcohol near bedtime, and maintaining a sleep environment that is cool, dark and quiet.  3.  Weight loss for overweight patients is recommended.    4.  Snoring recommendations include:  weight loss where appropriate, side sleeping, and avoidance of alcohol before bed.  5.  Operation of motor vehicle or dangerous equipment must be avoided when feeling drowsy,  excessively sleepy, or mentally fatigued.    6.  An ENT consultation which may be useful for specific causes of and possible treatment of bothersome snoring.   7. Weight loss may be of benefit in reducing the severity of snoring.   Signature: Armanda Magic, MD; [facility]; Diplomat, American Board of Sleep Medicine Electronically Signed: 06/09/2023 8:16:52 PM

## 2023-06-11 ENCOUNTER — Telehealth: Payer: Self-pay

## 2023-06-11 NOTE — Telephone Encounter (Signed)
-----   Message from Armanda Magic sent at 06/09/2023  8:18 PM EDT ----- Patient has very mild OSA - set up OV to discuss treatment options.

## 2023-06-11 NOTE — Telephone Encounter (Signed)
Notified patient of sleep study results and recommendations.All questions (if any) were answered. Patient verbalized understanding. Patient will call back and schedule appointment with Dr Mayford Knife to discuss treatment options at a later date.

## 2023-06-19 NOTE — Progress Notes (Signed)
Remote pacemaker transmission.   

## 2023-06-21 ENCOUNTER — Other Ambulatory Visit: Payer: Self-pay

## 2023-06-27 ENCOUNTER — Encounter: Payer: Self-pay | Admitting: Urology

## 2023-06-27 ENCOUNTER — Ambulatory Visit (INDEPENDENT_AMBULATORY_CARE_PROVIDER_SITE_OTHER): Payer: Medicare Other | Admitting: Urology

## 2023-06-27 VITALS — BP 130/80 | HR 74 | Ht 72.0 in | Wt 158.0 lb

## 2023-06-27 DIAGNOSIS — N3289 Other specified disorders of bladder: Secondary | ICD-10-CM | POA: Diagnosis not present

## 2023-06-27 DIAGNOSIS — D494 Neoplasm of unspecified behavior of bladder: Secondary | ICD-10-CM

## 2023-06-27 MED ORDER — TRIAMCINOLONE ACETONIDE 0.1 % EX OINT: TOPICAL_OINTMENT | CUTANEOUS | 0 refills | Status: DC

## 2023-06-27 NOTE — H&P (View-Only) (Signed)
I,Dina M Abdulla,acting as a scribe for Riki Altes, MD.,have documented all relevant documentation on the behalf of Riki Altes, MD,as directed by  Riki Altes, MD while in the presence of Riki Altes, MD.  06/27/2023 5:22 PM   [name]. Nov 24, 1945 [id number]  Referring provider: Jerl Mina, MD [address] [facility] [place],  [place] [address]  Chief Complaint  Patient presents with   Results   Urologic History:  Gross Hematuria Coumadin for 2-3 years but noted intermittent gross hematuria.  He was subsequently switched to Eliquis and began to have trace amounts of blood on this medication.    HPI: Collin Knight. is a 77 y.o. male presents to discuss recent CT imaging.  States he had been on Initially seen 05/29/2023 with gross hematuria. Refer to that note for details. 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 He was contacted regarding his CT results and recommended that his office cystoscopy be canceled and he come in to discuss TURBT. No recent gross hematuria.   PMH: Past Medical History:  Diagnosis Date   Anxiety    Chronic atrial fibrillation (HCC)    Chronic systolic CHF (congestive heart failure) (HCC)    Pacemaker    SSS (sick sinus syndrome) (HCC)     Surgical History: Past Surgical History:  Procedure Laterality Date   BIV PACEMAKER INSERTION CRT-P N/A 02/19/2023   Procedure: BIV PACEMAKER INSERTION CRT-P;  Surgeon: Lanier Prude, MD;  Location: [facility] INVASIVE CV LAB;  Service: Cardiovascular;  Laterality: N/A;   COLONOSCOPY WITH PROPOFOL N/A 09/25/2021   Procedure: COLONOSCOPY WITH PROPOFOL;  Surgeon: Jaynie Collins, DO;  Location: [facility] ENDOSCOPY;  Service: Gastroenterology;  Laterality: N/A;   EYE SURGERY     cataract both eyes   PACEMAKER LEADLESS  INSERTION N/A 09/30/2020   Procedure: PACEMAKER LEADLESS INSERTION;  Surgeon: Marcina Millard, MD;  Location: [facility] INVASIVE CV LAB;  Service: Cardiovascular;  Laterality: N/A;    Home Medications:  Allergies as of 06/27/2023   No Known Allergies      Medication List        Accurate as of June 27, 2023  5:22 PM. If you have any questions, ask your nurse or doctor.          acetaminophen 325 MG tablet Commonly known as: TYLENOL Take 1-2 tablets (325-650 mg total) by mouth every 4 (four) hours as needed for mild pain.   amiodarone 100 MG tablet Commonly known as: PACERONE TAKE 1 TABLET BY MOUTH EVERY DAY   apixaban 5 MG Tabs tablet Commonly known as: ELIQUIS Take 5 mg by mouth 2 (two) times daily.   Co Q 10 100 MG Caps Take 100 mg by mouth daily.   dapagliflozin propanediol 10 MG Tabs tablet Commonly known as: Farxiga Take 1 tablet (10 mg total) by mouth daily before breakfast.   diazepam 5 MG tablet Commonly known as: VALIUM Take 1 tablet by mouth every 6 (six) hours as needed for anxiety.   furosemide 40 MG tablet Commonly known as: LASIX Take 1 tablet (40 mg total) by mouth as needed.   losartan 25 MG tablet Commonly known as: COZAAR Take 2 tablets (50 mg total) by mouth daily.   spironolactone 25 MG tablet Commonly known as: ALDACTONE Take 0.5 tablets (12.5 mg total) by mouth daily.  triamcinolone ointment 0.1 % Commonly known as: KENALOG Apply to affected area once daily x 4 weeks Started by: Riki Altes   vitamin B-12 500 MCG tablet Commonly known as: CYANOCOBALAMIN Take 500 mcg by mouth daily.   Vitamin D3 50 MCG (2000 UT) capsule Take 2,000 Units by mouth daily.        Allergies: No Known Allergies  Family History: Family History  Problem Relation Age of Onset   Stroke Mother    Cancer - Lung Father     Social History:  reports that he has never smoked. He has never used smokeless tobacco. He reports current alcohol use  of about 21.0 standard drinks of alcohol per week. He reports that he does not use drugs.   Physical Exam: BP 130/80   Pulse 74   Ht 6' (1.829 m)   Wt 158 lb (71.7 kg)   BMI 21.43 kg/m   Constitutional:  Alert and oriented, No acute distress. HEENT: [place] AT Respiratory: Normal respiratory effort, no increased work of breathing. Psychiatric: Normal mood and affect.    Assessment & Plan:    1. 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 Recommend cystoscopy under anesthesia with TURBT. The procedure was discussed in detail, including potential risks of bleeding, infection, and rarely bladder injury. Although it appear the mass is away from the trigone. We discussed the possible need for stent placement if involving the UOs  Will not plan post-resection Gemcitabine as he will need intravesical chemotherapy based on tumor size if tumor noninvasive. We discussed possible need for additional treatment if found to have muscle invasive disease. All questions are answered and he desires to schedule.  I have reviewed the above documentation for accuracy and completeness, and I agree with the above.   Riki Altes, MD  [facility] [address] [place], [place] [address] [phone]

## 2023-06-27 NOTE — Progress Notes (Signed)
I,Dina M Abdulla,acting as a scribe for Riki Altes, MD.,have documented all relevant documentation on the behalf of Riki Altes, MD,as directed by  Riki Altes, MD while in the presence of Riki Altes, MD.  06/27/2023 5:22 PM   [name] September 25, 1946 [id number]  Referring provider: Jerl Mina, MD [address] [facility] [place],  [place] [address]  Chief Complaint  Patient presents with   Results   Urologic History:  Gross Hematuria Coumadin for 2-3 years but noted intermittent gross hematuria.  He was subsequently switched to Eliquis and began to have trace amounts of blood on this medication.    HPI: Collin Knight. is a 77 y.o. male presents to discuss recent CT imaging.  States he had been on Initially seen 05/29/2023 with gross hematuria. Refer to that note for details. 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 He was contacted regarding his CT results and recommended that his office cystoscopy be canceled and he come in to discuss TURBT. No recent gross hematuria.   PMH: Past Medical History:  Diagnosis Date   Anxiety    Chronic atrial fibrillation (HCC)    Chronic systolic CHF (congestive heart failure) (HCC)    Pacemaker    SSS (sick sinus syndrome) (HCC)     Surgical History: Past Surgical History:  Procedure Laterality Date   BIV PACEMAKER INSERTION CRT-P N/A 02/19/2023   Procedure: BIV PACEMAKER INSERTION CRT-P;  Surgeon: Lanier Prude, MD;  Location: [facility] INVASIVE CV LAB;  Service: Cardiovascular;  Laterality: N/A;   COLONOSCOPY WITH PROPOFOL N/A 09/25/2021   Procedure: COLONOSCOPY WITH PROPOFOL;  Surgeon: Jaynie Collins, DO;  Location: [facility] ENDOSCOPY;  Service: Gastroenterology;  Laterality: N/A;   EYE SURGERY     cataract both eyes   PACEMAKER LEADLESS  INSERTION N/A 09/30/2020   Procedure: PACEMAKER LEADLESS INSERTION;  Surgeon: Marcina Millard, MD;  Location: [facility] INVASIVE CV LAB;  Service: Cardiovascular;  Laterality: N/A;    Home Medications:  Allergies as of 06/27/2023   No Known Allergies      Medication List        Accurate as of June 27, 2023  5:22 PM. If you have any questions, ask your nurse or doctor.          acetaminophen 325 MG tablet Commonly known as: TYLENOL Take 1-2 tablets (325-650 mg total) by mouth every 4 (four) hours as needed for mild pain.   amiodarone 100 MG tablet Commonly known as: PACERONE TAKE 1 TABLET BY MOUTH EVERY DAY   apixaban 5 MG Tabs tablet Commonly known as: ELIQUIS Take 5 mg by mouth 2 (two) times daily.   Co Q 10 100 MG Caps Take 100 mg by mouth daily.   dapagliflozin propanediol 10 MG Tabs tablet Commonly known as: Farxiga Take 1 tablet (10 mg total) by mouth daily before breakfast.   diazepam 5 MG tablet Commonly known as: VALIUM Take 1 tablet by mouth every 6 (six) hours as needed for anxiety.   furosemide 40 MG tablet Commonly known as: LASIX Take 1 tablet (40 mg total) by mouth as needed.   losartan 25 MG tablet Commonly known as: COZAAR Take 2 tablets (50 mg total) by mouth daily.   spironolactone 25 MG tablet Commonly known as: ALDACTONE Take 0.5 tablets (12.5 mg total) by mouth daily.  triamcinolone ointment 0.1 % Commonly known as: KENALOG Apply to affected area once daily x 4 weeks Started by: Riki Altes   vitamin B-12 500 MCG tablet Commonly known as: CYANOCOBALAMIN Take 500 mcg by mouth daily.   Vitamin D3 50 MCG (2000 UT) capsule Take 2,000 Units by mouth daily.        Allergies: No Known Allergies  Family History: Family History  Problem Relation Age of Onset   Stroke Mother    Cancer - Lung Father     Social History:  reports that he has never smoked. He has never used smokeless tobacco. He reports current alcohol use  of about 21.0 standard drinks of alcohol per week. He reports that he does not use drugs.   Physical Exam: BP 130/80   Pulse 74   Ht 6' (1.829 m)   Wt 158 lb (71.7 kg)   BMI 21.43 kg/m   Constitutional:  Alert and oriented, No acute distress. HEENT: [place] AT Respiratory: Normal respiratory effort, no increased work of breathing. Psychiatric: Normal mood and affect.    Assessment & Plan:    1. 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 Recommend cystoscopy under anesthesia with TURBT. The procedure was discussed in detail, including potential risks of bleeding, infection, and rarely bladder injury. Although it appear the mass is away from the trigone. We discussed the possible need for stent placement if involving the UOs  Will not plan post-resection Gemcitabine as he will need intravesical chemotherapy based on tumor size if tumor noninvasive. We discussed possible need for additional treatment if found to have muscle invasive disease. All questions are answered and he desires to schedule.  I have reviewed the above documentation for accuracy and completeness, and I agree with the above.   Riki Altes, MD  [facility] [address] [place], [place] [address] [phone]

## 2023-06-28 ENCOUNTER — Telehealth: Payer: Self-pay

## 2023-06-28 ENCOUNTER — Other Ambulatory Visit: Payer: Self-pay

## 2023-06-28 DIAGNOSIS — D494 Neoplasm of unspecified behavior of bladder: Secondary | ICD-10-CM

## 2023-06-28 NOTE — Telephone Encounter (Signed)
Tried calling patient to schedule surgical procedure. No answer. Left a detailed voicemail on answering machine. Will try again.

## 2023-06-28 NOTE — Telephone Encounter (Signed)
Per Dr. 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   Patient was instructed that Dr. Lonna Cobb will require them to provide a pre-op UA & CX prior to surgery. This was ordered and scheduled drop off appointment was made for 07/01/2023.    Patient was directed to call [phone] between 1-3pm the day before surgery to find out surgical arrival time.  Instructions were given not to eat or drink from midnight on the night before surgery and have a driver for the day of surgery. On the surgery day patient was instructed to enter through the Medical Mall entrance of [facility] report the Same Day Surgery desk.   Pre-Admit Testing will be in contact via phone to set up an interview with the anesthesia team to review your history and medications prior to surgery.   Reminder of this information was sent via MyChart to the patient.

## 2023-06-28 NOTE — Progress Notes (Signed)
Surgical Physician Order Form [facility]  Dr. Irineo Axon, MD  * Scheduling expectation : Next Available  *Length of Case: 90 minutes  *Clearance needed: yes  *Anticoagulation Instructions: Hold all anticoagulants  *Aspirin Instructions: N/A  *Post-op visit Date/Instructions: PA visit 2 days postop catheter removal; 2-week postop with me for path review  *Diagnosis: Bladder Tumor  *Procedure:  TURBT >5cm (81191)   Additional orders: N/A  -Admit type: OUTpatient  -Anesthesia: Choice  -VTE Prophylaxis Standing Order SCD's       Other:   -Standing Lab Orders Per Anesthesia    Lab other: UA&Urine Culture  -Standing Test orders EKG/Chest x-ray per Anesthesia       Test other:   - Medications:  Ancef 2gm IV  -Other orders:  N/A

## 2023-06-28 NOTE — Telephone Encounter (Signed)
   Pre-operative Risk Assessment    Patient Name: Collin Knight.  DOB: 04/18/1946 MRN: [id number]     Request for Surgical Clearance     Procedure:  Transurethral Resection of Bladder Tumor   Date of Surgery:  Clearance 07/09/23                                 Surgeon: Dr. Irineo Axon      Surgeon's Group or Practice Name:  [facility]  Phone number:  [phone] Fax number:  [phone]   Type of Clearance Requested:   - Pharmacy:  Hold Apixaban (Eliquis)     Type of Anesthesia:  Choice    Additional requests/questions:    Collin Knight   06/28/2023, 4:57 PM

## 2023-06-28 NOTE — Progress Notes (Signed)
    [facility] Posting Form  Surgery Date: Date: 07/09/2023  Surgeon: Dr. Irineo Axon, MD  Inpt ( No  )   Outpt (Yes)   Obs ( No  )   Diagnosis: D49.4 Bladder Tumor  -CPT: 52240  Surgery: Transurethral Resection of Bladder Tumor  Stop Anticoagulations: Yes, will need to hold Eliquis  Cardiac/Medical/Pulmonary Clearance needed: yes, for Eliquis and PaceMaker   Clearance needed from Dr: Lalla Brothers with [facility]  Clearance request sent on: Date: 06/28/23  *Orders entered into EPIC  Date: 06/28/23   *Case booked in EPIC  Date: 06/28/23  *Notified pt of Surgery: Date: 06/28/23  PRE-OP UA & CX: yes, will obtain in clinic on 07/01/2023  *Placed into Prior Authorization Work Angela Nevin Date: 06/28/23  Assistant/laser/rep:No

## 2023-06-28 NOTE — Progress Notes (Signed)
  Phone Number: [phone] for Surgical Coordinator Fax Number: [phone]  REQUEST FOR SURGICAL CLEARANCE     Date: Date: 06/28/2023  Faxed to: Dr. Lalla Brothers  Surgeon: Dr. Irineo Axon, MD     Date of Surgery: Tuesday September 17th, 2024  Operation: Transurethral Resection of Bladder Tumor   Anesthesia Type: Choice   Diagnosis: Bladder Tumor  Patient Requires:   Cardiac / Vascular Clearance : Yes  Reason: Patient will need to hold Eliquis. Patient also with Pacemaker   Risk Assessment:    Low   []       Moderate   []     High   []           This patient is optimized for surgery  YES []       NO   []    I recommend further assessment/workup prior to surgery. YES []      NO  []   Appointment scheduled for: _______________________   Further recommendations: ____________________________________     Physician Signature:__________________________________   Printed Name: ________________________________________   Date: _________________

## 2023-07-01 ENCOUNTER — Other Ambulatory Visit: Payer: Medicare Other

## 2023-07-01 DIAGNOSIS — D494 Neoplasm of unspecified behavior of bladder: Secondary | ICD-10-CM

## 2023-07-01 LAB — URINALYSIS, COMPLETE
Bilirubin, UA: NEGATIVE
Ketones, UA: NEGATIVE
Leukocytes,UA: NEGATIVE
Nitrite, UA: NEGATIVE
Protein,UA: NEGATIVE
Specific Gravity, UA: <1.005 — ABNORMAL LOW (ref 1.005–1.030)
Urobilinogen, Ur: 0.2 mg/dL (ref 0.2–1.0)
pH, UA: 5.5 (ref 5.0–7.5)

## 2023-07-01 LAB — MICROSCOPIC EXAMINATION: RBC, Urine: >30 /HPF — AB (ref 0–2)

## 2023-07-01 NOTE — Telephone Encounter (Signed)
Pharmacy please advise on holding Eliquis prior to Transurethral Resection of Bladder Tumor  scheduled for 07/09/2023. Thank you.

## 2023-07-01 NOTE — Telephone Encounter (Signed)
Patient with diagnosis of afib on Eliquis for anticoagulation.    Procedure: Transurethral Resection of Bladder Tumor  Date of procedure: 07/09/23   CHA2DS2-VASc Score = 3   This indicates a 3.2% annual risk of stroke. The patient's score is based upon: CHF History: 1 HTN History: 0 Diabetes History: 0 Stroke History: 0 Vascular Disease History: 0 Age Score: 2 Gender Score: 0      CrCl 71 ml/min  Per office protocol, patient can hold Eliquis for 2-3 days prior to procedure.    **This guidance is not considered finalized until pre-operative APP has relayed final recommendations.**

## 2023-07-01 NOTE — Telephone Encounter (Signed)
   Patient Name: [facility].  DOB: Feb 07, 1946 MRN: [id number]  Primary Cardiologist: Marcina Millard, MD  Chart reviewed as part of pre-operative protocol coverage. Given past medical history and time since last visit, based on ACC/AHA guidelines, [facility]. is at acceptable risk for the planned procedure without further cardiovascular testing.   CHA2DS2-VASc Score = 3   This indicates a 3.2% annual risk of stroke. The patient's score is based upon: CHF History: 1 HTN History: 0 Diabetes History: 0 Stroke History: 0 Vascular Disease History: 0 Age Score: 2 Gender Score: 0       CrCl 71 ml/min   Per office protocol, patient can hold Eliquis for 2-3 days prior to procedure.   Please call with questions.  Joni Reining, NP 07/01/2023, 11:14 AM

## 2023-07-03 ENCOUNTER — Encounter
Admission: RE | Admit: 2023-07-03 | Discharge: 2023-07-03 | Disposition: A | Discharge status: Home / Self Care | Payer: Medicare Other | Source: Ambulatory Visit | Attending: Urology | Admitting: Urology

## 2023-07-03 DIAGNOSIS — Z79899 Other long term (current) drug therapy: Secondary | ICD-10-CM

## 2023-07-03 DIAGNOSIS — Z01810 Encounter for preprocedural cardiovascular examination: Secondary | ICD-10-CM

## 2023-07-03 DIAGNOSIS — Z950 Presence of cardiac pacemaker: Secondary | ICD-10-CM

## 2023-07-03 DIAGNOSIS — Z01812 Encounter for preprocedural laboratory examination: Secondary | ICD-10-CM

## 2023-07-03 DIAGNOSIS — I10 Essential (primary) hypertension: Secondary | ICD-10-CM

## 2023-07-03 DIAGNOSIS — I4891 Unspecified atrial fibrillation: Secondary | ICD-10-CM

## 2023-07-03 HISTORY — DX: Hematuria, unspecified: R31.9

## 2023-07-03 HISTORY — DX: Alcohol abuse, uncomplicated: F10.10

## 2023-07-03 HISTORY — DX: Psoriasis, unspecified: L40.9

## 2023-07-03 HISTORY — DX: Syncope and collapse: R55

## 2023-07-03 HISTORY — DX: Dyspnea, unspecified: R06.00

## 2023-07-03 HISTORY — DX: Lymphocytic colitis: K52.832

## 2023-07-03 HISTORY — DX: Unspecified hearing loss, unspecified ear: H91.90

## 2023-07-03 HISTORY — DX: Ventricular premature depolarization: I49.3

## 2023-07-03 HISTORY — DX: Other specified disorders of bladder: N32.89

## 2023-07-03 HISTORY — DX: Unspecified hemorrhoids: K64.9

## 2023-07-03 HISTORY — DX: Depression, unspecified: F32.A

## 2023-07-03 NOTE — Patient Instructions (Addendum)
Your procedure is scheduled on:07-09-23 Tuesday Report to the Registration Desk on the 1st floor of the Medical Mall.Then proceed to the 2nd floor Surgery Desk To find out your arrival time, please call [phone] between 1PM - 3PM on:07-08-23 Monday If your arrival time is 6:00 am, do not arrive before that time as the Medical Mall entrance doors do not open until 6:00 am.  REMEMBER: Instructions that are not followed completely may result in serious medical risk, up to and including death; or upon the discretion of your surgeon and anesthesiologist your surgery may need to be rescheduled.  Do not eat food OR drink any liquids after midnight the night before surgery.  No gum chewing or hard candies.  One week prior to surgery: Stop Anti-inflammatories (NSAIDS) such as Advil, Aleve, Ibuprofen, Motrin, Naproxen, Naprosyn and Aspirin based products such as Excedrin, Goody's Powder, BC Powder.You may however, take Tylenol if needed for pain up until the day of surgery. Stop ANY OVER THE COUNTER supplements/vitamins NOW (07-03-23) until after surgery (Vitamin D3 and B12)   Continue taking all prescribed medications with the exception of the following: -apixaban (ELIQUIS)-Stop 2 days prior to surgery-Last dose will be on 07-06-23 Saturday  TAKE ONLY THESE MEDICATIONS THE MORNING OF SURGERY WITH A SIP OF WATER: -amiodarone (PACERONE)   No Alcohol for 24 hours before or after surgery.  No Smoking including e-cigarettes for 24 hours before surgery.  No chewable tobacco products for at least 6 hours before surgery.  No nicotine patches on the day of surgery.  Do not use any "recreational" drugs for at least a week (preferably 2 weeks) before your surgery.  Please be advised that the combination of cocaine and anesthesia may have negative outcomes, up to and including death. If you test positive for cocaine, your surgery will be cancelled.  On the morning of surgery brush your teeth with  toothpaste and water, you may rinse your mouth with mouthwash if you wish. Do not swallow any toothpaste or mouthwash.  Do not wear jewelry, make-up, hairpins, clips or nail polish.  For welded (permanent) jewelry: bracelets, anklets, waist bands, etc.  Please have this removed prior to surgery.  If it is not removed, there is a chance that hospital personnel will need to cut it off on the day of surgery.  Do not wear lotions, powders, or perfumes.   Do not shave body hair from the neck down 48 hours before surgery.  Contact lenses, hearing aids and dentures may not be worn into surgery.  Do not bring valuables to the hospital. [facility] is not responsible for any missing/lost belongings or valuables.   Notify your doctor if there is any change in your medical condition (cold, fever, infection).  Wear comfortable clothing (specific to your surgery type) to the hospital.  After surgery, you can help prevent lung complications by doing breathing exercises.  Take deep breaths and cough every 1-2 hours. Your doctor may order a device called an Incentive Spirometer to help you take deep breaths. When coughing or sneezing, hold a pillow firmly against your incision with both hands. This is called "splinting." Doing this helps protect your incision. It also decreases belly discomfort.  If you are being admitted to the hospital overnight, leave your suitcase in the car. After surgery it may be brought to your room.  In case of increased patient census, it may be necessary for you, the patient, to continue your postoperative care in the Same Day Surgery department.  If you are being discharged the day of surgery, you will not be allowed to drive home. You will need a responsible individual to drive you home and stay with you for 24 hours after surgery.   If you are taking public transportation, you will need to have a responsible individual with you.  Please call the Pre-admissions Testing  Dept. at [phone] if you have any questions about these instructions.  Surgery Visitation Policy:  Patients having surgery or a procedure may have two visitors.  Children under the age of 101 must have an adult with them who is not the patient.

## 2023-07-03 NOTE — Pre-Procedure Instructions (Addendum)
Pt taking Farxiga for heart failure NOT diabetes. I asked Quentin Mulling NP if pt needs to stop this 3 days prior to surgery like we do for Diabetics. Quentin Mulling NP said pt can continue but not to take the day of surgery. Pt instructed regarding Marcelline Deist and pt verbalized understanding

## 2023-07-04 ENCOUNTER — Encounter: Payer: Self-pay | Admitting: Urgent Care

## 2023-07-04 ENCOUNTER — Encounter
Admission: RE | Admit: 2023-07-04 | Discharge: 2023-07-04 | Disposition: A | Discharge status: Home / Self Care | Payer: Medicare Other | Source: Ambulatory Visit | Attending: Urology | Admitting: Urology

## 2023-07-04 DIAGNOSIS — Z950 Presence of cardiac pacemaker: Secondary | ICD-10-CM | POA: Diagnosis not present

## 2023-07-04 DIAGNOSIS — R9431 Abnormal electrocardiogram [ECG] [EKG]: Secondary | ICD-10-CM | POA: Diagnosis not present

## 2023-07-04 DIAGNOSIS — Z79899 Other long term (current) drug therapy: Secondary | ICD-10-CM | POA: Diagnosis not present

## 2023-07-04 DIAGNOSIS — Z01812 Encounter for preprocedural laboratory examination: Secondary | ICD-10-CM | POA: Insufficient documentation

## 2023-07-04 DIAGNOSIS — Z01810 Encounter for preprocedural cardiovascular examination: Secondary | ICD-10-CM | POA: Diagnosis not present

## 2023-07-04 DIAGNOSIS — I4891 Unspecified atrial fibrillation: Secondary | ICD-10-CM | POA: Insufficient documentation

## 2023-07-04 DIAGNOSIS — I10 Essential (primary) hypertension: Secondary | ICD-10-CM | POA: Insufficient documentation

## 2023-07-04 DIAGNOSIS — Z01818 Encounter for other preprocedural examination: Secondary | ICD-10-CM | POA: Diagnosis present

## 2023-07-04 LAB — BASIC METABOLIC PANEL
Anion gap: 9 (ref 5–15)
BUN: 19 mg/dL (ref 8–23)
CO2: 23 mmol/L (ref 22–32)
Calcium: 8.6 mg/dL — ABNORMAL LOW (ref 8.9–10.3)
Chloride: 105 mmol/L (ref 98–111)
Creatinine, Ser: 0.92 mg/dL (ref 0.61–1.24)
GFR, Estimated: >60 mL/min (ref >60)
Glucose, Bld: 91 mg/dL (ref 70–99)
Potassium: 4.1 mmol/L (ref 3.5–5.1)
Sodium: 137 mmol/L (ref 135–145)

## 2023-07-05 ENCOUNTER — Encounter: Payer: Self-pay | Admitting: Cardiology

## 2023-07-05 LAB — CULTURE, URINE COMPREHENSIVE

## 2023-07-05 NOTE — Progress Notes (Signed)
PERIOPERATIVE PRESCRIPTION FOR IMPLANTED CARDIAC DEVICE PROGRAMMING  Patient Information: Name:  [facility].  DOB:  19-Apr-1946  MRN:  [id number]    Planned Procedure: TRANSURETHRAL RESECTION OF BLADDER TUMOR (TURBT)   Surgeon:  Dr. Irineo Axon, MD  Requesting device clearance: Quentin Mulling, FNP-C  Date of Procedure:  07/09/2023  Cautery will be used.   Please route documentation back me via [facility], or may fax report to [facility] PAT APP at [phone].  Device Information:  Clinic EP Physician:  Dr. Steffanie Dunn   Device Type:  Pacemaker Manufacturer and Phone #:  [employer]: [id number] Pacemaker Dependent?:  Yes.   Date of Last Device Check:  06/05/2023 Normal Device Function?:  Yes.    Electrophysiologist's Recommendations:  Have magnet available. Provide continuous ECG monitoring when magnet is used or reprogramming is to be performed.  Procedure should not interfere with device function.  No device programming or magnet placement needed.  Per Device Clinic Standing Orders, Lenor Coffin, RN  4:10 PM 07/05/2023

## 2023-07-08 ENCOUNTER — Ambulatory Visit: Payer: Medicare Other | Admitting: Urology

## 2023-07-08 ENCOUNTER — Encounter: Payer: Self-pay | Admitting: Urology

## 2023-07-08 NOTE — Progress Notes (Signed)
Perioperative / Anesthesia Services  Pre-Admission Testing Clinical Review / Pre-Operative Anesthesia Consult  Date: 07/08/23  Patient Demographics:  Name: Collin Knight. DOB:   May 08, 1946 MRN:   [id number]  Planned Surgical Procedure(s):    Case: [id number] Date/Time: 07/09/23 1139   Procedure: TRANSURETHRAL RESECTION OF BLADDER TUMOR (TURBT)   Anesthesia type: Choice   Pre-op diagnosis: Bladder Tumor   Location: [facility] OR ROOM 10 / [facility] ORS FOR ANESTHESIA GROUP   Surgeons: Riki Altes, MD     NOTE: Available PAT nursing documentation and vital signs have been reviewed. Clinical nursing staff has updated patient's PMH/PSHx, current medication list, and drug allergies/intolerances to ensure comprehensive history available to assist in medical decision making as it pertains to the aforementioned surgical procedure and anticipated anesthetic course. Extensive review of available clinical information personally performed. Collin Knight PMH and PSHx updated with any diagnoses/procedures that  may have been inadvertently omitted during his intake with the pre-admission testing department's nursing staff.  Clinical Discussion:  Collin Knight. is a 77 y.o. male who is submitted for pre-surgical anesthesia review and clearance prior to him undergoing the above procedure. Patient is a Former Games developer. Pertinent PMH includes: CAD, atrial fibrillation, SA node dysfunction/SSS (s/p PPM placement), pacing induced cardiomyopathy, CHF, PSVT, MVP, frequent PVCs, recurrent syncope, aortic atherosclerosis, HTN, HLD, COPD, OSAH (no nocturnal PAP therapy), bladder mass, anxiety (on BZO).  Patient is followed by cardiology Collin Junker, MD). He was last seen in the cardiology clinic on 04/15/2023; notes reviewed. At the time of his clinic visit, patient doing well overall from a cardiovascular perspective.  Patient complaining of chronic exertional dyspnea and generalized fatigue.  Symptoms are  reported to be stable and at baseline.  Patient denied any chest pain, PND, orthopnea, palpitations, significant peripheral edema, weakness, vertiginous symptoms, or presyncope/syncope. Patient with a past medical history significant for cardiovascular diagnoses. Documented physical exam was grossly benign, providing no evidence of acute exacerbation and/or decompensation of the patient's known cardiovascular conditions.  Patient with sinus pauses and episodes of recurrent syncope.  Given SA node dysfunction, patient ultimately underwent placement of a Micra AV leadless pacemaker on 09/30/2020.  Following leadless pacemaker placement, patient developed RV pacing induced cardiomyopathy.  Patient experienced a significant reduction in his overall cardiac function as evidenced by a LVEF of 20-25%.  Ultimately, patient required explantation of the leadless pacemaker and insertion of a dual-chamber device (Medtronic Azure XT DR MRI SureScan) on 02/19/2023.  Pacemaker is regularly interrogated by patient's primary electrophysiology team.  Device last interrogated on 06/05/2023, at which time it was noted to be functioning properly.  Coronary CTA was performed on 12/25/2021 that demonstrated an Agatston coronary artery calcium score of 1121. This placed patient in the 62 percentile for age, sex, and race matched controls. Calcium depositions noted in the RCA and proximal to mid LAD (moderate), as well as in the ostial OM1 branch (mild) distributions.  FFR analysis was performed revealing no significant stenosis (ranges: < 0.75 high likelihood of hemodynamically significant stenosis, 0.76-0.80 borderline, > 0.80 normal):  Left Main:  No significant stenosis. LAD: No significant stenosis.  FFRct 0.84 LCX: No significant stenosis.  FFRct 0.95 RCA: No significant stenosis.  FFRct 0.85  Long-term cardiac event monitor study performed on 10/31/2022 was difficult to ascertain.  Rhythm suspected to be fibrillation  with ventricular pacing versus sinus bradycardia with prolonged AV block.  There were 17 runs of PSVT noted with the fastest lasting 29.7 seconds at a maximum  rate of 148 bpm, and the longest lasting 1 minute and 35 seconds at an average rate of 97 bpm.  There were no sustained pauses or significant arrhythmias noted.  Most recent TTE was performed on 04/30/2023 revealing a low normal left ventricular systolic function with an EF of 50-55%.  There were no regional wall motion abnormalities.  There was mild LVH. Left ventricular diastolic Doppler parameters consistent with abnormal relaxation (G1DD).  GLS -17.4%.  Right ventricular size and function normal.  There was mild mitral valve regurgitation noted in the setting of mild late systolic prolapse of the posterior leaflet of the mitral valve.  All transvalvular gradients were noted to be normal providing no evidence of valvular stenosis.  There was mild dilatation of the abdominal aorta measuring up to 32 mm.  Patient with an atrial fibrillation diagnosis; CHA2DS2-VASc Score = 5 (age x 2, CHF, HTN, vascular disease history).currently, his cardiac rate and rhythm are currently being maintained on oral amiodarone. He was being chronically anticoagulated using warfarin; reported to be compliant with therapy with no evidence or reports of GI/GU bleeding.  Blood pressure well controlled at 130/80 mmHg on currently prescribed diuretic (furosemide + spironolactone) and ARB (losartan therapies.  Patient not currently taking any type of lipid-lowering therapies for his HLD diagnosis and ASCVD prevention.  Patient is not diabetic.  Given his cardiomyopathy, patient is on an SGLT2i (dapagliflozin) for added cardiovascular and renovascular protection.  Functional capacity somewhat limited by patient's age and multiple medical comorbidities.  That said, patient is able to complete all of his ADLs/IADLs without significant cardiovascular limitation.  Patient is felt to be  able to achieve at least 4 METS of physical activity without experiencing any significant degree of angina/anginal equivalent symptoms.  Warfarin was discontinued and patient was started on DOAC therapy (standard dose apixaban).  No other changes were made to his medication regimen.  Patient to follow-up with outpatient cardiology in 3 months or sooner if needed.  [facility] is scheduled for an TRANSURETHRAL RESECTION OF BLADDER TUMOR (TURBT) on 07/09/2023 with Dr. Irineo Axon, MD.  Given patient's past medical history significant for cardiovascular diagnoses, presurgical cardiac clearance was sought by the PAT team. Per cardiology, "based ACC/AHA guidelines, the patient's past medical history, and the amount of time since his last clinic visit, this patient would be at an overall ACCEPTABLE risk for the planned procedure without further cardiovascular testing or intervention at this time".    Again, this patient is on daily oral anticoagulation therapy using a DOAC medication.  He has been instructed on recommendations from his cardiologist for holding his apixaban for 2 days prior to his procedure with plans to restart since postoperative bleeding risk felt to be minimized by his primary attending surgeon.  Patient is aware that his last dose of apixaban should be on 07/06/2023.  Patient denies previous perioperative complications with anesthesia in the past. In review of the available records, it is noted that patient underwent a general anesthetic course here at [facility] in 09/2021 without documented complications.      06/27/2023    2:44 PM 05/30/2023    2:29 PM 05/29/2023    2:20 PM  Vitals with BMI  Height 6\' 0"  5\' 10"  5\' 10"   Weight 158 lbs 158 lbs 153 lbs  BMI 21.42 22.67 21.95  Systolic 130 120 956  Diastolic 80 62 81  Pulse 74 66 83    Providers/Specialists:  NOTE: Primary physician provider listed below. Patient may have  been seen by APP or partner within same practice.   PROVIDER ROLE / SPECIALTY LAST Winfield Cunas, MD Urology (Surgeon) 06/27/2023  Jerl Mina, MD Primary Care Provider 12/14/2021  Marcina Millard, MD Cardiology 04/15/2023  Arvilla Meres, MD Advanced Heart Failure 05/07/2023; update preop APP call on 07/01/2023   Allergies:  Patient has no known allergies.  Current Home Medications:   No current facility-administered medications for this encounter.    amiodarone (PACERONE) 100 MG tablet   apixaban (ELIQUIS) 5 MG TABS tablet   Cholecalciferol (VITAMIN D3) 50 MCG (2000 UT) capsule   dapagliflozin propanediol (FARXIGA) 10 MG TABS tablet   diazepam (VALIUM) 5 MG tablet   furosemide (LASIX) 40 MG tablet   losartan (COZAAR) 25 MG tablet   spironolactone (ALDACTONE) 25 MG tablet   triamcinolone ointment (KENALOG) 0.1 %   vitamin B-12 (CYANOCOBALAMIN) 500 MCG tablet   History:   Past Medical History:  Diagnosis Date   Alcohol use disorder, mild, abuse    10-12 oz wine daily   Anxiety    a.) on BZO (diazepam) PRN   Aortic atherosclerosis (HCC)    Bladder mass    CAD (coronary artery disease) 12/25/2021   a.) cCTA 12/25/2021: Ca2+ 1121 (76th %'ile for age/sex/race match control); mild oOM1, mod RCA and p-mLAD disease   Chronic atrial fibrillation (HCC)    a.) CHA2DS2-VASc = 5 (age x2, CHF, HTN, vascular disease history); b.) cardiac rate/rhythm maintained on oral amiodardone; chronically anticoagulated using apixaban   Chronic systolic CHF (congestive heart failure) (HCC)    a.) TTE 09/29/2020: EF 50-55%, no RWMAs, mild MR; b.) TTE 11/22/2021: EF 20-25%, mild RVE with reduced RVSF, mild LAE, mod RAE; c.) TTE 12/14/2022: EF 25-30%, glob HK, mild LVH, mod MR, mild-mod TR, mild AR, Ao root 39mm; d.) TTE 04/30/2023: EF 50-55%, mild MR, mild late systolic prolapse PLMV   COPD (chronic obstructive pulmonary disease) (HCC)    Depression    Dyspnea    Frequent PVCs     Hematuria    Hemorrhoids    History of bilateral cataract extraction 2018   HLD (hyperlipidemia)    HOH (hard of hearing)    bilateral hearing aids   HTN (hypertension)    Long term current use of amiodarone    Lymphocytic colitis    MVP (mitral valve prolapse) 04/30/2023   a.) TTE 04/30/2023: mild late systolic prolapse PLMV   On apixaban therapy    OSA (obstructive sleep apnea)    a.) no nocturnal PAP therapy; mild disease with AHI 5.8/hr   Pacing-induced cardiomyopathy (HCC)    a.) symptomatic bradycardia with RV pacing inducing cardiomypathy s/p Micra AV leadless PPM place --> device removed and dual chamber MDT XT DR MRI Surescan device placed 02/19/2023   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   a.) SA node dysfunction --> s/p Micra AV leadless PPM placement 09/30/2020; b.) developed symptomatic bradycardia with pacing induced cardiomyopathy --> Micra device explanted and new MDT Azure XT DR MRI SureScan (SN: [id number]) pdevice placed   Psoriasis    PSVT (paroxysmal supraventricular tachycardia) 10/31/2022   a.) Zio 10/31/2022: 17 runs PSVT with longest last 1 min 35 secs and fastestest lasting 29.7 secs at a rate of 148 bpm   Recurrent syncope    Sinus pause    SSS (sick sinus syndrome) (HCC)    Past Surgical History:  Procedure Laterality Date   BIV PACEMAKER  INSERTION CRT-P N/A 02/19/2023   Procedure: BIV PACEMAKER INSERTION CRT-P;  Surgeon: Lanier Prude, MD;  Location: [facility] INVASIVE CV LAB;  Service: Cardiovascular;  Laterality: N/A;   CATARACT EXTRACTION Bilateral 2018   COLONOSCOPY WITH PROPOFOL N/A 09/25/2021   Procedure: COLONOSCOPY WITH PROPOFOL;  Surgeon: Jaynie Collins, DO;  Location: [facility] ENDOSCOPY;  Service: Gastroenterology;  Laterality: N/A;   PACEMAKER LEADLESS INSERTION N/A 09/30/2020   Procedure: PACEMAKER LEADLESS INSERTION;  Surgeon: Marcina Millard, MD;  Location: [facility] INVASIVE CV LAB;  Service: Cardiovascular;  Laterality: N/A;    Family History  Problem Relation Age of Onset   Stroke Mother    Cancer - Lung Father    Social History   Tobacco Use   Smoking status: Former    Types: Cigarettes   Smokeless tobacco: Never  Vaping Use   Vaping status: Never Used  Substance Use Topics   Alcohol use: Yes    Alcohol/week: 21.0 standard drinks of alcohol    Types: 21 Cans of beer per week    Comment: 10-12 oz wine per day   Drug use: Never    Pertinent Clinical Results:  LABS:   No visits with results within 3 Day(s) from this visit.  Latest known visit with results is:  Hospital Outpatient Visit on 07/04/2023  Component Date Value Ref Range Status   Sodium 07/04/2023 137  135 - 145 mmol/L Final   Potassium 07/04/2023 4.1  3.5 - 5.1 mmol/L Final   Chloride 07/04/2023 105  98 - 111 mmol/L Final   CO2 07/04/2023 23  22 - 32 mmol/L Final   Glucose, Bld 07/04/2023 91  70 - 99 mg/dL Final   Glucose reference range applies only to samples taken after fasting for at least 8 hours.   BUN 07/04/2023 19  8 - 23 mg/dL Final   Creatinine, Ser 07/04/2023 0.92  0.61 - 1.24 mg/dL Final   Calcium 40/98/1191 8.6 (L)  8.9 - 10.3 mg/dL Final   GFR, Estimated 07/04/2023 >60  >60 mL/min Final   Comment: (NOTE) Calculated using the CKD-EPI Creatinine Equation (2021)    Anion gap 07/04/2023 9  5 - 15 Final   Performed at [facility], [address] [place], [place] [address]    ECG: Date: 07/04/2023 Time ECG obtained: 1426 PM Rate: 61 bpm Rhythm:  Atrial paced rhythm with prolonged AV conduction Axis (leads I and aVF): Left axis deviation Intervals: PR 256 ms. QRS 86 ms. QTc 438 ms. ST segment and T wave changes: No evidence of acute ST segment elevation or depression.  Evidence of a possible age undetermined inferior and anterior infarcts present. 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 No evidence of metastatic disease in the abdomen/pelvis. 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  TRANSTHORACIC ECHOCARDIOGRAM performed on 04/30/2023 Left ventricular ejection fraction, by estimation, is 50 to 55%. The left ventricle has low normal function. The left ventricle has no regional wall motion abnormalities. There is mild left ventricular hypertrophy. Left ventricular diastolic parameters are consistent with Grade I diastolic dysfunction (impaired relaxation). The average left ventricular global longitudinal strain is -17.4 %. The global longitudinal strain is normal.  Right ventricular systolic function is normal. The right ventricular size is normal. Tricuspid regurgitation signal is inadequate for assessing PA pressure.  The mitral valve is abnormal. Mild mitral valve  regurgitation. No evidence of mitral stenosis. 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  The aortic valve is tricuspid. Aortic valve regurgitation is not visualized. No aortic stenosis is present.  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  The inferior vena cava is normal in size with greater than 50% respiratory variability, suggesting right atrial pressure of 3 mmHg.   LONG TERM CARDIAC EVENT MONITOR STUDY performed on  10/31/2022 Patch Wear Time:  13 days and 19 hours (2023-12-18T16:09:56-0500 to 2024-01-01T11:27:41-0500) Underlying rhythm difficult to ascertain suspect AF with ventricular pacing but may be sinus brady with prolonged AV block- avg HR of 50 bpm.  17 runs of Supraventricular Tachycardia occurred, the run with the fastest interval lasting 29.7 secs with a max rate of 148 bpm, the longest lasting 1 min 35 secs with an avg rate of 97 bpm.  Rare PACs and PVCs.  CT CORONARY MORPH W/CTA COR W/SCORE W/CA W/CM &/OR WO/CM W/ FRACTIONAL FLOW RESERVE DATA PREP performed on  12/25/2021 Coronary calcium score of 1121. This was 76th percentile for age and sex matched control. Normal coronary origin with right dominance. Moderate stenosis in the RCA and proximal-mid LAD. Mild stenosis in the ostial OM1 branch. CAD-RADS 3. Moderate stenosis. Consider symptom-guided anti-ischemic pharmacotherapy as well as risk factor modification per guideline directed care. FFR analysis Small BILATERAL pleural effusions with compressive atelectasis of the adjacent lower lobes. Underlying emphysematous and bronchitic changes consistent with COPD. Upper normal caliber ascending thoracic aorta 3.9 cm diameter. Aortic atherosclerosis  Impression and Plan:  [facility] has been referred for pre-anesthesia review and clearance prior to him undergoing the planned anesthetic and procedural courses. Available labs, pertinent testing, and imaging results were personally reviewed by me in preparation for upcoming operative/procedural course. [facility] medical record has been updated following extensive record review and patient interview with PAT staff.   This patient has been appropriately cleared by cardiology with an overall ACCEPTABLE risk of experiencing significant perioperative cardiovascular complications. Completed perioperative prescription for cardiac device management documentation completed by primary cardiology team and placed on patient's chart for review by the surgical/anesthetic team on the day of his procedure. Electrophysiology indicating that procedure should not interfere with planned surgical procedure. Beyond normal perioperative cardiovascular monitoring, there are no recommendations from electrophysiology team that prompt further discussion/recommendations from industry representative.   Based on clinical review performed today (07/08/23), barring any significant acute changes in the patient's overall condition, it is anticipated that he will be able to  proceed with the planned surgical intervention. Any acute changes in clinical condition may necessitate his procedure being postponed and/or cancelled. Patient will meet with anesthesia team (MD and/or CRNA) on the day of his procedure for preoperative evaluation/assessment. Questions regarding anesthetic course will be fielded at that time.   Pre-surgical instructions were reviewed with the patient during his PAT appointment, and questions were fielded to satisfaction by PAT clinical staff. He has been instructed on which medications that he will need to hold prior to surgery, as well as the ones that have been deemed safe/appropriate to take on the day of his procedure. As part of the general education provided by PAT, patient made aware both verbally and in writing, that he would need to abstain from the use of any illegal substances during his perioperative course.  He was advised that failure to follow the provided instructions could necessitate case cancellation or result in serious perioperative complications up to  and including death. Patient encouraged to contact PAT and/or his surgeon's office to discuss any questions or concerns that may arise prior to surgery; verbalized understanding.   Quentin Mulling, MSN, APRN, FNP-C, CEN [facility]  Perioperative Services Nurse Practitioner Phone: [phone] Fax: [phone] 07/08/23 5:52 PM  NOTE: This note has been prepared using Dragon dictation software. Despite my best ability to proofread, there is always the potential that unintentional transcriptional errors may still occur from this process.

## 2023-07-09 ENCOUNTER — Ambulatory Visit: Payer: Medicare Other | Admitting: Urgent Care

## 2023-07-09 ENCOUNTER — Encounter
Admission: RE | Disposition: A | Discharge status: Home / Self Care | Payer: Self-pay | Source: Home / Self Care | Attending: Urology

## 2023-07-09 ENCOUNTER — Ambulatory Visit
Admission: RE | Admit: 2023-07-09 | Discharge: 2023-07-09 | Disposition: A | Discharge status: Home / Self Care | Payer: Medicare Other | Attending: Urology | Admitting: Urology

## 2023-07-09 ENCOUNTER — Encounter: Payer: Self-pay | Admitting: Urology

## 2023-07-09 ENCOUNTER — Other Ambulatory Visit: Payer: Self-pay

## 2023-07-09 DIAGNOSIS — I482 Chronic atrial fibrillation, unspecified: Secondary | ICD-10-CM | POA: Insufficient documentation

## 2023-07-09 DIAGNOSIS — Z950 Presence of cardiac pacemaker: Secondary | ICD-10-CM | POA: Diagnosis not present

## 2023-07-09 DIAGNOSIS — I2510 Atherosclerotic heart disease of native coronary artery without angina pectoris: Secondary | ICD-10-CM | POA: Insufficient documentation

## 2023-07-09 DIAGNOSIS — G4733 Obstructive sleep apnea (adult) (pediatric): Secondary | ICD-10-CM | POA: Diagnosis not present

## 2023-07-09 DIAGNOSIS — I341 Nonrheumatic mitral (valve) prolapse: Secondary | ICD-10-CM | POA: Diagnosis not present

## 2023-07-09 DIAGNOSIS — I700 Atherosclerosis of aorta: Secondary | ICD-10-CM | POA: Diagnosis not present

## 2023-07-09 DIAGNOSIS — C678 Malignant neoplasm of overlapping sites of bladder: Secondary | ICD-10-CM | POA: Diagnosis not present

## 2023-07-09 DIAGNOSIS — I428 Other cardiomyopathies: Secondary | ICD-10-CM | POA: Insufficient documentation

## 2023-07-09 DIAGNOSIS — Z7901 Long term (current) use of anticoagulants: Secondary | ICD-10-CM | POA: Insufficient documentation

## 2023-07-09 DIAGNOSIS — I5022 Chronic systolic (congestive) heart failure: Secondary | ICD-10-CM | POA: Diagnosis not present

## 2023-07-09 DIAGNOSIS — D494 Neoplasm of unspecified behavior of bladder: Secondary | ICD-10-CM | POA: Diagnosis present

## 2023-07-09 DIAGNOSIS — I110 Hypertensive heart disease with heart failure: Secondary | ICD-10-CM | POA: Insufficient documentation

## 2023-07-09 DIAGNOSIS — C672 Malignant neoplasm of lateral wall of bladder: Secondary | ICD-10-CM | POA: Diagnosis not present

## 2023-07-09 DIAGNOSIS — C674 Malignant neoplasm of posterior wall of bladder: Secondary | ICD-10-CM | POA: Diagnosis not present

## 2023-07-09 DIAGNOSIS — I495 Sick sinus syndrome: Secondary | ICD-10-CM | POA: Diagnosis not present

## 2023-07-09 DIAGNOSIS — J449 Chronic obstructive pulmonary disease, unspecified: Secondary | ICD-10-CM | POA: Insufficient documentation

## 2023-07-09 HISTORY — DX: Chronic obstructive pulmonary disease, unspecified: J44.9

## 2023-07-09 HISTORY — DX: Hyperlipidemia, unspecified: E78.5

## 2023-07-09 HISTORY — DX: Other long term (current) drug therapy: Z79.899

## 2023-07-09 HISTORY — DX: Long term (current) use of anticoagulants: Z79.01

## 2023-07-09 HISTORY — PX: TRANSURETHRAL RESECTION OF BLADDER TUMOR: SHX2575

## 2023-07-09 HISTORY — DX: Other specified complication of cardiac prosthetic devices, implants and grafts, initial encounter: T82.897A

## 2023-07-09 HISTORY — DX: Cardiomyopathy, unspecified: I42.9

## 2023-07-09 HISTORY — DX: Obstructive sleep apnea (adult) (pediatric): G47.33

## 2023-07-09 HISTORY — DX: Essential (primary) hypertension: I10

## 2023-07-09 HISTORY — DX: Other specified heart block: I45.5

## 2023-07-09 HISTORY — DX: Atherosclerosis of aorta: I70.0

## 2023-07-09 SURGERY — TURBT (TRANSURETHRAL RESECTION OF BLADDER TUMOR)
Anesthesia: General

## 2023-07-09 MED ORDER — DEXMEDETOMIDINE HCL IN NACL 80 MCG/20ML IV SOLN
INTRAVENOUS | Status: DC | PRN
Start: 2023-07-09 — End: 2023-07-09
  Administered 2023-07-09: 8 ug via INTRAVENOUS

## 2023-07-09 MED ORDER — FENTANYL CITRATE (PF) 100 MCG/2ML IJ SOLN
INTRAMUSCULAR | Status: AC
  Filled 2023-07-09: qty 2

## 2023-07-09 MED ORDER — SUGAMMADEX SODIUM 200 MG/2ML IV SOLN
INTRAVENOUS | Status: DC | PRN
  Administered 2023-07-09: 50 mg via INTRAVENOUS
  Administered 2023-07-09: 150 mg via INTRAVENOUS

## 2023-07-09 MED ORDER — PROPOFOL 10 MG/ML IV BOLUS
INTRAVENOUS | Status: DC | PRN
  Administered 2023-07-09: 200 mg via INTRAVENOUS

## 2023-07-09 MED ORDER — DEXAMETHASONE SODIUM PHOSPHATE 10 MG/ML IJ SOLN
INTRAMUSCULAR | Status: DC | PRN
  Administered 2023-07-09: 10 mg via INTRAVENOUS

## 2023-07-09 MED ORDER — LACTATED RINGERS IV SOLN: INTRAVENOUS | Status: DC | PRN

## 2023-07-09 MED ORDER — ONDANSETRON HCL 4 MG/2ML IJ SOLN
INTRAMUSCULAR | Status: AC
  Filled 2023-07-09: qty 2

## 2023-07-09 MED ORDER — CHLORHEXIDINE GLUCONATE 0.12 % MT SOLN
OROMUCOSAL | Status: AC
  Filled 2023-07-09: qty 15

## 2023-07-09 MED ORDER — PROMETHAZINE HCL 25 MG/ML IJ SOLN: 6.2500 mg | INTRAMUSCULAR | Status: DC | PRN

## 2023-07-09 MED ORDER — ROCURONIUM BROMIDE 100 MG/10ML IV SOLN
INTRAVENOUS | Status: DC | PRN
  Administered 2023-07-09: 10 mg via INTRAVENOUS
  Administered 2023-07-09: 50 mg via INTRAVENOUS
  Administered 2023-07-09: 10 mg via INTRAVENOUS
  Administered 2023-07-09: 5 mg via INTRAVENOUS

## 2023-07-09 MED ORDER — OXYCODONE HCL 5 MG/5ML PO SOLN: 5.0000 mg | Freq: Once | ORAL | Status: DC | PRN

## 2023-07-09 MED ORDER — FAMOTIDINE 20 MG PO TABS
20.0000 mg | ORAL_TABLET | Freq: Once | ORAL | Status: AC
  Administered 2023-07-09: 20 mg via ORAL

## 2023-07-09 MED ORDER — LIDOCAINE HCL (CARDIAC) PF 100 MG/5ML IV SOSY
PREFILLED_SYRINGE | INTRAVENOUS | Status: DC | PRN
  Administered 2023-07-09: 100 mg via INTRAVENOUS

## 2023-07-09 MED ORDER — SOLIFENACIN SUCCINATE 10 MG PO TABS: 10.0000 mg | ORAL_TABLET | Freq: Every day | ORAL | 0 refills | Status: DC

## 2023-07-09 MED ORDER — LIDOCAINE HCL (PF) 2 % IJ SOLN
INTRAMUSCULAR | Status: AC
  Filled 2023-07-09: qty 5

## 2023-07-09 MED ORDER — FENTANYL CITRATE (PF) 100 MCG/2ML IJ SOLN
25.0000 ug | INTRAMUSCULAR | Status: DC | PRN
  Administered 2023-07-09 (×4): 25 ug via INTRAVENOUS

## 2023-07-09 MED ORDER — OXYBUTYNIN CHLORIDE 5 MG PO TABS
5.0000 mg | ORAL_TABLET | Freq: Once | ORAL | Status: AC
  Administered 2023-07-09: 5 mg via ORAL

## 2023-07-09 MED ORDER — DROPERIDOL 2.5 MG/ML IJ SOLN: 0.6250 mg | Freq: Once | INTRAMUSCULAR | Status: DC | PRN

## 2023-07-09 MED ORDER — ROCURONIUM BROMIDE 10 MG/ML (PF) SYRINGE
PREFILLED_SYRINGE | INTRAVENOUS | Status: AC
  Filled 2023-07-09: qty 10

## 2023-07-09 MED ORDER — SODIUM CHLORIDE 0.9 % IR SOLN
Status: DC | PRN
  Administered 2023-07-09 (×11): 3000 mL

## 2023-07-09 MED ORDER — LACTATED RINGERS IV SOLN: INTRAVENOUS | Status: DC

## 2023-07-09 MED ORDER — FLUORESCEIN SODIUM 10 % IV SOLN
INTRAVENOUS | Status: DC | PRN
  Administered 2023-07-09: 100 mg via INTRAVENOUS

## 2023-07-09 MED ORDER — FLUORESCEIN SODIUM 10 % IV SOLN
INTRAVENOUS | Status: AC
  Filled 2023-07-09: qty 5

## 2023-07-09 MED ORDER — DEXAMETHASONE SODIUM PHOSPHATE 10 MG/ML IJ SOLN
INTRAMUSCULAR | Status: AC
  Filled 2023-07-09: qty 1

## 2023-07-09 MED ORDER — FENTANYL CITRATE (PF) 100 MCG/2ML IJ SOLN
INTRAMUSCULAR | Status: DC | PRN
  Administered 2023-07-09 (×2): 50 ug via INTRAVENOUS

## 2023-07-09 MED ORDER — CHLORHEXIDINE GLUCONATE 0.12 % MT SOLN
15.0000 mL | Freq: Once | OROMUCOSAL | Status: AC
  Administered 2023-07-09: 15 mL via OROMUCOSAL

## 2023-07-09 MED ORDER — CEFAZOLIN SODIUM-DEXTROSE 2-4 GM/100ML-% IV SOLN
INTRAVENOUS | Status: AC
  Filled 2023-07-09: qty 100

## 2023-07-09 MED ORDER — FAMOTIDINE 20 MG PO TABS
ORAL_TABLET | ORAL | Status: AC
  Filled 2023-07-09: qty 1

## 2023-07-09 MED ORDER — OXYCODONE HCL 5 MG PO TABS: 5.0000 mg | ORAL_TABLET | Freq: Once | ORAL | Status: DC | PRN

## 2023-07-09 MED ORDER — ACETAMINOPHEN 10 MG/ML IV SOLN: 1000.0000 mg | Freq: Once | INTRAVENOUS | Status: DC | PRN

## 2023-07-09 MED ORDER — ORAL CARE MOUTH RINSE: 15.0000 mL | Freq: Once | OROMUCOSAL | Status: AC

## 2023-07-09 MED ORDER — ONDANSETRON HCL 4 MG/2ML IJ SOLN
INTRAMUSCULAR | Status: DC | PRN
  Administered 2023-07-09: 4 mg via INTRAVENOUS

## 2023-07-09 MED ORDER — CEFAZOLIN SODIUM-DEXTROSE 2-4 GM/100ML-% IV SOLN
2.0000 g | INTRAVENOUS | Status: AC
  Administered 2023-07-09: 2 g via INTRAVENOUS

## 2023-07-09 MED ORDER — OXYBUTYNIN CHLORIDE 5 MG PO TABS
ORAL_TABLET | ORAL | Status: AC
  Filled 2023-07-09: qty 1

## 2023-07-09 SURGICAL SUPPLY — 25 items
BAG DRAIN SIEMENS DORNER NS (MISCELLANEOUS) ×1 IMPLANT
BAG DRN LRG CPC RND TRDRP CNTR (MISCELLANEOUS) ×1
BAG DRN NS LF (MISCELLANEOUS) ×1
BAG URO DRAIN 4000ML (MISCELLANEOUS) IMPLANT
CATH FOL 2WAY LX 20X30 (CATHETERS) IMPLANT
DRSG TELFA 3X4 N-ADH STERILE (GAUZE/BANDAGES/DRESSINGS) ×1 IMPLANT
ELECT BIVAP BIPO 22/24 DONUT (ELECTROSURGICAL) ×1
ELECT LOOP 22F BIPOLAR SML (ELECTROSURGICAL)
ELECT REM PT RETURN 9FT ADLT (ELECTROSURGICAL)
ELECTRD BIVAP BIPO 22/24 DONUT (ELECTROSURGICAL) IMPLANT
ELECTRODE LOOP 22F BIPOLAR SML (ELECTROSURGICAL) IMPLANT
ELECTRODE REM PT RTRN 9FT ADLT (ELECTROSURGICAL) IMPLANT
GLOVE BIOGEL PI IND STRL 7.5 (GLOVE) ×1 IMPLANT
GOWN STRL REUS W/ TWL LRG LVL3 (GOWN DISPOSABLE) ×1 IMPLANT
GOWN STRL REUS W/TWL LRG LVL3 (GOWN DISPOSABLE) ×1
GOWN STRL REUS W/TWL XL LVL4 (GOWN DISPOSABLE) ×1 IMPLANT
IV NS IRRIG 3000ML ARTHROMATIC (IV SOLUTION) ×2 IMPLANT
KIT TURNOVER CYSTO (KITS) ×1 IMPLANT
LOOP CUT BIPOLAR 24F LRG (ELECTROSURGICAL) IMPLANT
PACK CYSTO AR (MISCELLANEOUS) ×1 IMPLANT
SET IRRIG Y TYPE TUR BLADDER L (SET/KITS/TRAYS/PACK) ×1 IMPLANT
SURGILUBE 2OZ TUBE FLIPTOP (MISCELLANEOUS) ×1 IMPLANT
SYR TOOMEY IRRIG 70ML (MISCELLANEOUS) ×1
SYRINGE TOOMEY IRRIG 70ML (MISCELLANEOUS) ×1 IMPLANT
WATER STERILE IRR 500ML POUR (IV SOLUTION) ×1 IMPLANT

## 2023-07-09 NOTE — Progress Notes (Signed)
Patient checked prior to discharge.  Foley catheter draining blood-tinged urine which is translucent.  He was comfortable with discharge.

## 2023-07-09 NOTE — Transfer of Care (Signed)
Immediate Anesthesia Transfer of Care Note  Patient: Collin Knight.  Procedure(s) Performed: TRANSURETHRAL RESECTION OF BLADDER TUMOR (TURBT)  Patient Location: PACU  Anesthesia Type:General  Level of Consciousness: awake and patient cooperative  Airway & Oxygen Therapy: Patient Spontanous Breathing and Patient connected to nasal cannula oxygen  Post-op Assessment: Report given to RN and Patient moving all extremities  Post vital signs: Reviewed and stable  Last Vitals:  Vitals Value Taken Time  BP 157/87 07/09/23 1412  Temp 37 C 07/09/23 1412  Pulse 62 07/09/23 1412  Resp 15 07/09/23 1412  SpO2 96 % 07/09/23 1412  Vitals shown include unfiled device data.  Last Pain:  Vitals:   07/09/23 1412  TempSrc: Axillary  PainSc:          Complications: No notable events documented.

## 2023-07-09 NOTE — Anesthesia Preprocedure Evaluation (Signed)
Anesthesia Evaluation  Patient identified by MRN, date of birth, ID band Patient awake    Reviewed: Allergy & Precautions, H&P , NPO status , Patient's Chart, lab work & pertinent test results  Airway Mallampati: III  TM Distance: >3 FB Neck ROM: full    Dental no notable dental hx.    Pulmonary sleep apnea , COPD, former smoker   Pulmonary exam normal        Cardiovascular hypertension, + CAD and +CHF (HFimpEF)  + dysrhythmias Atrial Fibrillation + pacemaker    TTE 04/30/2023: mild late systolic prolapse PLMV   Neuro/Psych  PSYCHIATRIC DISORDERS Anxiety     negative neurological ROS     GI/Hepatic negative GI ROS, Neg liver ROS,,,  Endo/Other  negative endocrine ROS    Renal/GU      Musculoskeletal   Abdominal Normal abdominal exam  (+)   Peds  Hematology negative hematology ROS (+)   Anesthesia Other Findings CAD, atrial fibrillation, SA node dysfunction/SSS (s/p PPM placement), pacing induced cardiomyopathy, CHF, PSVT, MVP, frequent PVCs, recurrent syncope, aortic atherosclerosis, HTN, HLD, COPD, OSAH (no nocturnal PAP therapy), bladder mass, anxiety (on BZO)    Past Medical History: No date: Alcohol use disorder, mild, abuse     Comment:  10-12 oz wine daily No date: Anxiety     Comment:  a.) on BZO (diazepam) PRN No date: Aortic atherosclerosis (HCC) No date: Bladder mass 12/25/2021: CAD (coronary artery disease)     Comment:  a.) cCTA 12/25/2021: Ca2+ 1121 (76th %'ile for               age/sex/race match control); mild oOM1, mod RCA and               p-mLAD disease No date: Chronic atrial fibrillation (HCC)     Comment:  a.) CHA2DS2-VASc = 5 (age x2, CHF, HTN, vascular disease              history); b.) cardiac rate/rhythm maintained on oral               amiodardone; chronically anticoagulated using apixaban No date: Chronic systolic CHF (congestive heart failure) (HCC)     Comment:  a.) TTE  09/29/2020: EF 50-55%, no RWMAs, mild MR; b.)               TTE 11/22/2021: EF 20-25%, mild RVE with reduced RVSF,               mild LAE, mod RAE; c.) TTE 12/14/2022: EF 25-30%, glob               HK, mild LVH, mod MR, mild-mod TR, mild AR, Ao root 39mm;              d.) TTE 04/30/2023: EF 50-55%, mild MR, mild late               systolic prolapse PLMV No date: COPD (chronic obstructive pulmonary disease) (HCC) No date: Depression No date: Dyspnea No date: Frequent PVCs No date: Hematuria No date: Hemorrhoids 2018: History of bilateral cataract extraction No date: HLD (hyperlipidemia) No date: HOH (hard of hearing)     Comment:  bilateral hearing aids No date: HTN (hypertension) No date: Long term current use of amiodarone No date: Lymphocytic colitis 04/30/2023: MVP (mitral valve prolapse)     Comment:  a.) TTE 04/30/2023: mild late systolic prolapse PLMV No date: On apixaban therapy No date: OSA (obstructive sleep apnea)     Comment:  a.) no nocturnal PAP therapy; mild disease with AHI               5.8/hr No date: Pacing-induced cardiomyopathy (HCC)     Comment:  a.) symptomatic bradycardia with RV pacing inducing               cardiomypathy s/p Micra AV leadless PPM place --> device               removed and dual chamber MDT XT DR MRI Surescan device               placed 02/19/2023 09/30/2020: Presence of permanent cardiac pacemaker     Comment:  a.) SA node dysfunction --> s/p Micra AV leadless PPM               placement 09/30/2020; b.) developed symptomatic               bradycardia with pacing induced cardiomyopathy --> Micra               device explanted and new MDT Azure XT DR MRI SureScan               (SN: [id number]) pdevice placed No date: Psoriasis 10/31/2022: PSVT (paroxysmal supraventricular tachycardia)     Comment:  a.) Zio 10/31/2022: 17 runs PSVT with longest last 1 min              35 secs and fastestest lasting 29.7 secs at a rate of 148               bpm No date: Recurrent syncope No date: Sinus pause No date: SSS (sick sinus syndrome) (HCC)  Past Surgical History: 02/19/2023: BIV PACEMAKER INSERTION CRT-P; N/A     Comment:  Procedure: BIV PACEMAKER INSERTION CRT-P;  Surgeon:               Lanier Prude, MD;  Location: [facility] INVASIVE CV [facility];                Service: Cardiovascular;  Laterality: N/A; 2018: CATARACT EXTRACTION; Bilateral 09/25/2021: COLONOSCOPY WITH PROPOFOL; N/A     Comment:  Procedure: COLONOSCOPY WITH PROPOFOL;  Surgeon: Jaynie Collins, DO;  Location: [facility] ENDOSCOPY;  Service:               Gastroenterology;  Laterality: N/A; 09/30/2020: PACEMAKER LEADLESS INSERTION; N/A     Comment:  Procedure: PACEMAKER LEADLESS INSERTION;  Surgeon:               Marcina Millard, MD;  Location: [facility] INVASIVE CV               LAB;  Service: Cardiovascular;  Laterality: N/A;  BMI    Body Mass Index: 21.39 kg/m      Reproductive/Obstetrics negative OB ROS                              Anesthesia Physical Anesthesia Plan  ASA: 3  Anesthesia Plan: General ETT   Post-op Pain Management: Ofirmev IV (intra-op)* and Toradol IV (intra-op)*   Induction: Intravenous  PONV Risk Score and Plan: 2 and Ondansetron and Dexamethasone  Airway Management Planned: Oral ETT  Additional Equipment:   Intra-op Plan:   Post-operative Plan: Extubation in OR  Informed Consent: I have reviewed the patients History and Physical, chart, labs and  discussed the procedure including the risks, benefits and alternatives for the proposed anesthesia with the patient or authorized representative who has indicated his/her understanding and acceptance.     Dental Advisory Given  Plan Discussed with: CRNA and Surgeon  Anesthesia Plan Comments:          Anesthesia Quick Evaluation

## 2023-07-09 NOTE — Interval H&P Note (Signed)
History and Physical Interval Note:  07/09/2023 11:20 AM  Collin Knight.  has presented today for surgery, with the diagnosis of Bladder Tumor.  The various methods of treatment have been discussed with the patient and family. After consideration of risks, benefits and other options for treatment, the patient has consented to  Procedure(s): TRANSURETHRAL RESECTION OF BLADDER TUMOR (TURBT) (N/A) as a surgical intervention.  The patient's history has been reviewed, patient examined, no change in status, stable for surgery.  I have reviewed the patient's chart and labs.  Questions were answered to the patient's satisfaction.    CV:RRR Lungs:clear  Riki Altes

## 2023-07-09 NOTE — Op Note (Signed)
Preoperative diagnosis: Bladder tumor (6 cm)  Postoperative diagnosis:  Bladder tumor (6 cm)  Procedure:  Cystoscopy Transurethral resection of bladder tumor (>5 cm)  Surgeon: Lorin Picket C. Livier Hendel, M.D.  Anesthesia: General  Complications: None  Intraoperative findings:  Cystoscopy-urethra normal in caliber without stricture; prominent lateral lobe enlargement prostatic urethra; papillary tumor extending from the junction of the bladder base/posterior wall to just anterior to the right hemitrigone.  Estimated size of continuous tumor 6 cm A-P; additional papillary tumor right lateral wall and posterior wall estimated 15 mm and 10 mm respectively.  Left UO normal-appearing with clear efflux, right UO was obscured by tumor however identified and not involved by tumor  EBL: ~ 50 mL  Specimens: Bladder tumor Base of bladder tumor      Indication: Collin Knight. is a 77 y.o. male with history of intermittent gross hematuria.  He was scheduled and cystoscopy however CT urogram showed normal upper tracts and a large enhancing right-sided bladder mass.  Cystoscopy under anesthesia with TURBT was recommended. After reviewing the management options for treatment, he elected to proceed with the above surgical procedure(s). We have discussed the potential benefits and risks of the procedure, side effects of the proposed treatment, the likelihood of the patient achieving the goals of the procedure, and any potential problems that might occur during the procedure or recuperation. Informed consent has been obtained.  Description of procedure:  The patient was taken to the operating room and general anesthesia was induced.  The patient was placed in the dorsal lithotomy position, prepped and draped in the usual sterile fashion, and preoperative antibiotics were administered. A preoperative time-out was performed.   A 26 French continuous-flow resectoscope sheath with obturator was  lubricated and placed per urethra.  The obturator was exchanged for an [facility] resectoscope with loop.  Panendoscopy was performed with findings as described above.  Measurements were obtained using the resectoscope.  Using bipolar current resection was started at the right posterior aspect of the tumor working medially and anteriorly towards the right hemitrigone. Hemostasis was obtained using cautery.  Due to tumor size this portion of the procedure was lengthy.  Once the tumor was resected to the region of the right hemitrigone the patient was given 100 mg of intravesical fluorescein.  The lateral aspect of the most anterior portion of the tumor was resected.  Once forcing appeared in the bladder and the right UO was identified and there was some early papillary changes encroaching the UO though no involvement of the ureteral orifice.  These areas were carefully fulgurated taking care not to involve the orifice.  Once the main tumor was completely resected hemostasis was obtained with cautery.  The right lateral wall tumor was resected and although the patient had been paralyzed obturator reflex was encountered.  Close inspection of this area showed no perforation but the bladder wall was thin in this area.  The posterior wall satellite tumor was then resected.  All specimen was removed via irrigation and sent as bladder tumor.  Areas of the base of the tumor were then resected and some areas cold biopsy and sent as base of tumor.  At the completion procedure all specimen had been removed from the bladder.  Some oozing vessels at the bladder neck were controlled with cautery.  UOs were effluxing fluorescein dyed urine bilaterally.  The resectoscope was removed and a 20 French Foley catheter was placed and was irrigated with return of blood-tinged effluent.  The balloon was  inflated with 15 mL of sterile water and the catheter was placed to gravity drainage.  After anesthetic reversal he was transported  to the PACU in stable condition.  Plan: Due to the thin area on the right lateral wall the catheter will be left indwelling for 7 days. Should he develop any postoperative problems and require an ED visit CBI should not be performed and urologist on-call contacted   Collin Axon, MD

## 2023-07-09 NOTE — Anesthesia Procedure Notes (Signed)
Procedure Name: Intubation Date/Time: 07/09/2023 11:44 AM  Performed by: Lily Lovings, CRNAPre-anesthesia Checklist: Patient identified, Patient being monitored, Timeout performed, Emergency Drugs available and Suction available Patient Re-evaluated:Patient Re-evaluated prior to induction Oxygen Delivery Method: Circle system utilized Preoxygenation: Pre-oxygenation with 100% oxygen Induction Type: IV induction Ventilation: Mask ventilation without difficulty Laryngoscope Size: 3 and McGraph Grade View: Grade I Tube type: Oral Tube size: 7.5 mm Number of attempts: 1 Airway Equipment and Method: Stylet Placement Confirmation: ETT inserted through vocal cords under direct vision, positive ETCO2 and breath sounds checked- equal and bilateral Secured at: 22 cm Tube secured with: Tape Dental Injury: Teeth and Oropharynx as per pre-operative assessment

## 2023-07-09 NOTE — Discharge Instructions (Addendum)
Transurethral Resection of Bladder Tumor (TURBT) or Bladder Biopsy   Definition:  Transurethral Resection of the Bladder Tumor is a surgical procedure used to diagnose and remove tumors within the bladder.   General instructions:     Your recent bladder surgery requires very little post hospital care but some definite precautions.  Despite the fact that no skin incisions were used, the area around the bladder incisions are raw and covered with scabs to promote healing and prevent bleeding. Certain precautions are needed to insure that the scabs are not disturbed over the next 2-4 weeks while the healing proceeds.  Because the raw surface inside your bladder and the irritating effects of urine you may expect frequency of urination and/or urgency (a stronger desire to urinate) and perhaps even getting up at night more often. This will usually resolve or improve slowly over the healing period. You may see some blood in your urine over the first 6 weeks. Do not be alarmed, even if the urine was clear for a while. Get off your feet and drink lots of fluids until clearing occurs. If you start to pass clots or don't improve call us.  Diet:  You may return to your normal diet immediately. Because of the raw surface of your bladder, alcohol, spicy foods, foods high in acid and drinks with caffeine may cause irritation or frequency and should be used in moderation. To keep your urine flowing freely and avoid constipation, drink plenty of fluids during the day (8-10 glasses). Tip: Avoid cranberry juice because it is very acidic.  Activity:  Your physical activity doesn't need to be restricted. However, if you are very active, you may see some blood in the urine. We suggest that you reduce your activity under the circumstances until the bleeding has stopped.  Bowels:  It is important to keep your bowels regular during the postoperative period. Straining with bowel movements can cause bleeding. A bowel  movement every other day is reasonable. Use a mild laxative if needed, such as milk of magnesia 2-3 tablespoons, or 2 Dulcolax tablets. Call if you continue to have problems. If you had been taking narcotics for pain, before, during or after your surgery, you may be constipated. Take a laxative if necessary.    Medication:  You should resume your pre-surgery medications unless told not to. In addition you may be given an antibiotic to prevent or treat infection. Antibiotics are not always necessary. All medication should be taken as prescribed until the bottles are finished unless you are having an unusual reaction to one of the drugs.   Foley Catheter Care and Patient Education  Perform catheter care every day.  You can do this while in the shower, but NOT while taking a tub bath.  You will need the following supplies: -mild soap, such as Dove -water -a clean washcloth (not one already used for bathing) or a 4x4 piece of gauze -1 Cath-Secure -night drainage bag -2 alcohol swabs  Was you hands thoroughly with soap and water Using mild soap and water, clan your genital area Men should retract the foreskin, if needed, and clean the area, including the penis Women should separate the labia, and clean the area from front to back  3. Clean your urinary opening, which is where the catheter enters your body. 4. Clean the catheter from where it enters your body and then down, away from your body.  Hold the catheter at the point it enters your body so that you don't put  tension on it. 5. Rinse the area well and dry it gently.  Changing the drainage bag You will change your drainage bag twice a day -in the morning after you shower, change he night bag to the leg bag -at night before you go to bed, change the leg bag to the night bag  Wash your hands thoroughly with soap and water Empty the urine from the drainage bag into the toilet before you change it Pinch off the catheter with your  fingers and disconnect the used bag Wipe the end of the catheter using an alcohol pad Wipe the connector on the bag using the second alcohol pad Connect the clean bag to the catheter and release your finger pinch Check all connections.  Straighten any kinks or twits in the tubing  Caring for the Leg bag -always wear the leg bag below your knee.  This will help it drain. -keep the leg bag secure with the velcro straps.  If the straps leave a mark on your leg they are to tight and should be loosened.  Leaving the straps too tight can decrease you circulation and lead to blood clots. -empty the leg bag through the spout at the bottom every 2-4 hours as needed.  Do not let the bag become completely full. -do not lie down for longer than 2 hours while you are wearing the leg bag.  Caring for the Night Bag -always keep the night bag below the level of your bladder . -to hang your night bag while you sleep, place a clean plastic bag inside of a wastebasket.  Hang the night bag on the inside of the wastebasket.  Cleaning the Drainage bag Wash you hands thoroughly with soap and water. Rinse the equipment with cool water.  Do not use hot water it can damage the plastic equipment. Was the equipment with a mild liquid detergent (ivory) and rinse with cool water. To decrease odor, fill the bag halfway with a mixture of 1 part vinegar and 3 parts water. Shake the bag and let it sit for 15 minutes. Rinse the bag with cool water and hang it up to dry.  Preventing infection -keep the drainage bag below the level of your bladder and off the floor at all times. -keep the catheter secured to your thigh to prevent it from moving. -do not lie on or block the flow of urine in the tubing. -shower daily to keep the catheter clean. -clean your hands before and after touching the catheter or bag. -the spout of the drainage bag should never touch the side of the toilet or any emptying container.  Special  Points -You may see some blood or urine around where the catheter enters your body, especially when walking or having a bowel movement.  This is normal, as long as there is urine draining into the drainage bag.  If you experience significant leakage around  catheter tube where it enters your urethra possibly associated with lower abdominal cramping you could be having a bladder spasm.  Please notify your doctor and we can prescribe you a medication to improve your symptoms. -drink 1-2 glasses of liquids every 2 hours while you're awake.  Call your doctor immediately if -your catheter comes out, do not try to replace it yourself -you have temperature of 101F (38.8C) or higher -you have bright red blood or large blood clots in your urine -you have abdominal pain and no urine in your catheter bag   [facility] [address] [place], [place] [address] [phone]     AMBULATORY SURGERY  DISCHARGE INSTRUCTIONS  The drugs that you were given will stay in your system until tomorrow so for the next 24 hours you should not:  Drive an automobile Make any legal decisions Drink any alcoholic beverage  You may resume regular meals tomorrow.  Today it is better to start with liquids and gradually work up to solid foods.  You may eat anything you prefer, but it is better to start with liquids, then soup and crackers, and gradually work up to solid foods.  Please notify your doctor immediately if you have any unusual bleeding, trouble breathing, redness and pain at the surgery site, drainage, fever, or pain not relieved by medication.  Additional Instructions:  Please contact your physician with any problems or Same Day Surgery at [phone], Monday through Friday 6 am to 4 pm, or [place] at [facility] number at [phone].

## 2023-07-10 ENCOUNTER — Encounter: Payer: Self-pay | Admitting: Urology

## 2023-07-11 ENCOUNTER — Other Ambulatory Visit: Payer: Medicare Other | Admitting: Urology

## 2023-07-11 ENCOUNTER — Ambulatory Visit (INDEPENDENT_AMBULATORY_CARE_PROVIDER_SITE_OTHER): Payer: Medicare Other | Admitting: Physician Assistant

## 2023-07-11 VITALS — BP 118/72 | HR 143

## 2023-07-11 DIAGNOSIS — Z87448 Personal history of other diseases of urinary system: Secondary | ICD-10-CM | POA: Diagnosis not present

## 2023-07-11 DIAGNOSIS — Z09 Encounter for follow-up examination after completed treatment for conditions other than malignant neoplasm: Secondary | ICD-10-CM

## 2023-07-11 DIAGNOSIS — D494 Neoplasm of unspecified behavior of bladder: Secondary | ICD-10-CM

## 2023-07-11 LAB — SURGICAL PATHOLOGY

## 2023-07-11 NOTE — Progress Notes (Signed)
Patient presented to clinic today for urine check.  He is POD 2 from TURBT of a large bladder tumor with Dr. Lonna Cobb.  Urine is light pink without clots on arrival.  He states it was yellow yesterday.  He reports some intermittent tugging and irritation from the catheter, so I affixed it to his right thigh with a StatLock, which she stated immediately helped with his discomfort.  We discussed that Dr. Heywood Footman recommendation is to keep his Foley catheter in place for 1 week postop to allow for healing due to the depth of tumor resection, which increases his risk for bladder rupture.  He will require a cystogram prior to Foley removal and I am placing orders for this today.  He expressed understanding.  Dr. Lonna Cobb to arrange Foley removal.  Carman Ching, PA-C 07/11/23 4:48 PM

## 2023-07-11 NOTE — Anesthesia Postprocedure Evaluation (Signed)
Anesthesia Post Note  Patient: Collin Knight.  Procedure(s) Performed: TRANSURETHRAL RESECTION OF BLADDER TUMOR (TURBT)  Patient location during evaluation: PACU Anesthesia Type: General Level of consciousness: awake and alert Pain management: pain level controlled Vital Signs Assessment: post-procedure vital signs reviewed and stable Respiratory status: spontaneous breathing, nonlabored ventilation and respiratory function stable Cardiovascular status: blood pressure returned to baseline and stable Postop Assessment: no apparent nausea or vomiting Anesthetic complications: no   No notable events documented.   Last Vitals:  Vitals:   07/09/23 1504 07/09/23 1601  BP: (!) 162/83 129/79  Pulse: 64 72  Resp: 18   Temp: (!) 36.3 C   SpO2: 97% 95%    Last Pain:  Vitals:   07/10/23 0831  TempSrc:   PainSc: 1                  Foye Deer

## 2023-07-15 ENCOUNTER — Encounter: Payer: Self-pay | Admitting: Urology

## 2023-07-17 ENCOUNTER — Ambulatory Visit
Admission: RE | Admit: 2023-07-17 | Discharge: 2023-07-17 | Disposition: A | Discharge status: Home / Self Care | Payer: Medicare Other | Source: Ambulatory Visit | Attending: Physician Assistant

## 2023-07-17 ENCOUNTER — Ambulatory Visit
Admission: RE | Admit: 2023-07-17 | Discharge: 2023-07-17 | Disposition: A | Discharge status: Home / Self Care | Payer: Medicare Other | Source: Ambulatory Visit | Attending: Physician Assistant | Admitting: Physician Assistant

## 2023-07-17 DIAGNOSIS — D494 Neoplasm of unspecified behavior of bladder: Secondary | ICD-10-CM | POA: Diagnosis present

## 2023-07-17 MED ORDER — IOTHALAMATE MEGLUMINE 17.2 % UR SOLN
500.0000 mL | Freq: Once | URETHRAL | Status: AC | PRN
  Administered 2023-07-17: 300 mL via INTRAVESICAL

## 2023-07-24 ENCOUNTER — Ambulatory Visit: Payer: Medicare Other | Admitting: Urology

## 2023-07-24 ENCOUNTER — Encounter: Payer: Self-pay | Admitting: Urology

## 2023-07-24 VITALS — BP 130/70 | HR 74 | Wt 151.0 lb

## 2023-07-24 DIAGNOSIS — C679 Malignant neoplasm of bladder, unspecified: Secondary | ICD-10-CM

## 2023-07-24 NOTE — H&P (View-Only) (Signed)
I, Collin Knight, acting as a scribe for Collin Altes, MD., have documented all relevant documentation on the behalf of Collin Altes, MD, as directed by Collin Altes, MD while in the presence of Collin Altes, MD.  07/24/2023 1:21 PM   Collin Knight [place]. 07-07-1946 [id number]  Referring provider: Jerl Mina, MD [address] [facility] [place],  [place] [address]  Chief Complaint  Patient presents with   Results   Urologic History:   Gross Hematuria Coumadin for 2-3 years but noted intermittent gross hematuria.  He was subsequently switched to Eliquis and began to have trace amounts of blood on this medication.     HPI: Collin Knight. is a 77 y.o. male presents for a post-op follow-up.  TURBT 07/09/23 for a large papillary tumor. Bladder wall was thin after obturator reflex, though no evidence of perforation. His catheter was left in and he had a cystogram 1 week post-op which showed no extravasation.  He was informed of his pathology results via MyChart. 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 Muscularis was present and not involved.  Other than catheter irritation, he has no complaints.   PMH: Past Medical History:  Diagnosis Date   Alcohol use disorder, mild, abuse    10-12 oz wine daily   Anxiety    a.) on BZO (diazepam) PRN   Aortic atherosclerosis (HCC)    Bladder mass    CAD (coronary artery disease) 12/25/2021   a.) cCTA 12/25/2021: Ca2+ 1121 (76th %'ile for age/sex/race match control); mild oOM1, mod RCA and p-mLAD disease   Chronic atrial fibrillation (HCC)    a.) CHA2DS2-VASc = 5 (age x2, CHF, HTN, vascular disease history); b.) cardiac rate/rhythm maintained on oral amiodardone; chronically anticoagulated using apixaban   Chronic systolic CHF (congestive heart failure) (HCC)    a.) TTE 09/29/2020: EF 50-55%, no RWMAs, mild MR; b.) TTE 11/22/2021: EF 20-25%, mild RVE with  reduced RVSF, mild LAE, mod RAE; c.) TTE 12/14/2022: EF 25-30%, glob HK, mild LVH, mod MR, mild-mod TR, mild AR, Ao root 39mm; d.) TTE 04/30/2023: EF 50-55%, mild MR, mild late systolic prolapse PLMV   COPD (chronic obstructive pulmonary disease) (HCC)    Depression    Dyspnea    Frequent PVCs    Hematuria    Hemorrhoids    History of bilateral cataract extraction 2018   HLD (hyperlipidemia)    HOH (hard of hearing)    bilateral hearing aids   HTN (hypertension)    Long term current use of amiodarone    Lymphocytic colitis    MVP (mitral valve prolapse) 04/30/2023   a.) TTE 04/30/2023: mild late systolic prolapse PLMV   On apixaban therapy    OSA (obstructive sleep apnea)    a.) no nocturnal PAP therapy; mild disease with AHI 5.8/hr   Pacing-induced cardiomyopathy (HCC)    a.) symptomatic bradycardia with RV pacing inducing cardiomypathy s/p Micra AV leadless PPM place --> device removed and dual chamber MDT XT DR MRI Surescan device placed 02/19/2023   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   a.) SA node dysfunction --> s/p Micra AV leadless PPM placement 09/30/2020; b.) developed symptomatic bradycardia with pacing induced cardiomyopathy --> Micra device explanted and new MDT Azure XT DR MRI SureScan (SN: [id number]) pdevice placed   Psoriasis    PSVT (paroxysmal supraventricular tachycardia) (HCC) 10/31/2022   a.) Zio 10/31/2022: 17 runs PSVT with longest last 1  min 35 secs and fastestest lasting 29.7 secs at a rate of 148 bpm   Recurrent syncope    Sinus pause    SSS (sick sinus syndrome) (HCC)     Surgical History: Past Surgical History:  Procedure Laterality Date   BIV PACEMAKER INSERTION CRT-P N/A 02/19/2023   Procedure: BIV PACEMAKER INSERTION CRT-P;  Surgeon: Lanier Prude, MD;  Location: [facility] INVASIVE CV LAB;  Service: Cardiovascular;  Laterality: N/A;   CATARACT EXTRACTION Bilateral 2018   COLONOSCOPY WITH PROPOFOL N/A 09/25/2021   Procedure:  COLONOSCOPY WITH PROPOFOL;  Surgeon: Jaynie Collins, DO;  Location: [facility] ENDOSCOPY;  Service: Gastroenterology;  Laterality: N/A;   PACEMAKER LEADLESS INSERTION N/A 09/30/2020   Procedure: PACEMAKER LEADLESS INSERTION;  Surgeon: Marcina Millard, MD;  Location: [facility] INVASIVE CV LAB;  Service: Cardiovascular;  Laterality: N/A;   TRANSURETHRAL RESECTION OF BLADDER TUMOR N/A 07/09/2023   Procedure: TRANSURETHRAL RESECTION OF BLADDER TUMOR (TURBT);  Surgeon: Collin Altes, MD;  Location: [facility] ORS;  Service: Urology;  Laterality: N/A;    Home Medications:  Allergies as of 07/24/2023   No Known Allergies      Medication List        Accurate as of July 24, 2023  1:21 PM. If you have any questions, ask your nurse or doctor.          amiodarone 100 MG tablet Commonly known as: PACERONE TAKE 1 TABLET BY MOUTH EVERY DAY What changed: when to take this   apixaban 5 MG Tabs tablet Commonly known as: ELIQUIS Take 5 mg by mouth 2 (two) times daily.   dapagliflozin propanediol 10 MG Tabs tablet Commonly known as: Farxiga Take 1 tablet (10 mg total) by mouth daily before breakfast.   diazepam 5 MG tablet Commonly known as: VALIUM Take 1 tablet by mouth every 6 (six) hours as needed for anxiety.   furosemide 40 MG tablet Commonly known as: LASIX Take 1 tablet (40 mg total) by mouth as needed.   losartan 25 MG tablet Commonly known as: COZAAR Take 2 tablets (50 mg total) by mouth daily. What changed: when to take this   solifenacin 10 MG tablet Commonly known as: VESICARE Take 1 tablet (10 mg total) by mouth daily.   spironolactone 25 MG tablet Commonly known as: ALDACTONE Take 0.5 tablets (12.5 mg total) by mouth daily. What changed: when to take this   triamcinolone ointment 0.1 % Commonly known as: KENALOG Apply to affected area once daily x 4 weeks What changed:  how much to take when to take this   vitamin B-12 500 MCG tablet Commonly known as:  CYANOCOBALAMIN Take 500 mcg by mouth daily.   Vitamin D3 50 MCG (2000 UT) capsule Take 2,000 Units by mouth daily.        Allergies: No Known Allergies  Family History: Family History  Problem Relation Age of Onset   Stroke Mother    Cancer - Lung Father     Social History:  reports that he has quit smoking. His smoking use included cigarettes. He has never used smokeless tobacco. He reports current alcohol use of about 21.0 standard drinks of alcohol per week. He reports that he does not use drugs.   Physical Exam: BP 130/70   Pulse 74   Wt 151 lb (68.5 kg)   BMI 21.36 kg/m   Constitutional:  Alert and oriented, No acute distress. HEENT: [place] AT Respiratory: Normal respiratory effort, no increased work of breathing. Psychiatric: Normal mood and  affect.   Assessment & Plan:    1. 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 Due to the extent of his tumor, I have recommended restaging biopsies and he was in agreement with this plan.  The procedure was discussed in detail including potential risks of bleeding, infection, bladder perforation.  If no evidence of muscle invasion on restaging, I recommended a 6 week course of induction BCG. We discussed BCG installation and the most common side effects of irritative voiding symptoms, occasional flu-like symptoms and rarely BCG sepsis.  The need for monitoring with surveillance cystoscopy as well as possible recommendation of maintenance BCG was discussed. All questions were answered and he desires to proceed.  Catheter was removed today.   I have reviewed the above documentation for accuracy and completeness, and I agree with the above.   Collin Altes, MD  [facility] [address] [place], [place] [address] [phone]

## 2023-07-24 NOTE — Progress Notes (Signed)
Catheter Removal  Patient is present today for a catheter removal.  12ml of water was drained from the balloon. A 20FR foley cath was removed from the bladder, no complications were noted. Patient tolerated well.  Performed by: Ples Specter CMA

## 2023-07-24 NOTE — Progress Notes (Signed)
I, Collin Knight, acting as a scribe for Collin Knight., have documented all relevant documentation on the behalf of Collin Knight, as directed by Collin Knight while in the presence of Collin Knight.  07/24/2023 1:21 PM   [name]. 07-07-1946 [id number]  Referring provider: Jerl Mina, Knight [address] [facility] [place],  [place] [address]  Chief Complaint  Patient presents with   Results   Urologic History:   Gross Hematuria Coumadin for 2-3 years but noted intermittent gross hematuria.  He was subsequently switched to Eliquis and began to have trace amounts of blood on this medication.     HPI: Collin [facility] Montez Hageman. is a 77 y.o. male presents for a post-op follow-up.  TURBT 07/09/23 for a large papillary tumor. Bladder wall was thin after obturator reflex, though no evidence of perforation. His catheter was left in and he had a cystogram 1 week post-op which showed no extravasation.  He was informed of his pathology results via MyChart. 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 Muscularis was present and not involved.  Other than catheter irritation, he has no complaints.   PMH: Past Medical History:  Diagnosis Date   Alcohol use disorder, mild, abuse    10-12 oz wine daily   Anxiety    a.) on BZO (diazepam) PRN   Aortic atherosclerosis (HCC)    Bladder mass    CAD (coronary artery disease) 12/25/2021   a.) cCTA 12/25/2021: Ca2+ 1121 (76th %'ile for age/sex/race match control); mild oOM1, mod RCA and p-mLAD disease   Chronic atrial fibrillation (HCC)    a.) CHA2DS2-VASc = 5 (age x2, CHF, HTN, vascular disease history); b.) cardiac rate/rhythm maintained on oral amiodardone; chronically anticoagulated using apixaban   Chronic systolic CHF (congestive heart failure) (HCC)    a.) TTE 09/29/2020: EF 50-55%, no RWMAs, mild MR; b.) TTE 11/22/2021: EF 20-25%, mild RVE with  reduced RVSF, mild LAE, mod RAE; c.) TTE 12/14/2022: EF 25-30%, glob HK, mild LVH, mod MR, mild-mod TR, mild AR, Ao root 39mm; d.) TTE 04/30/2023: EF 50-55%, mild MR, mild late systolic prolapse PLMV   COPD (chronic obstructive pulmonary disease) (HCC)    Depression    Dyspnea    Frequent PVCs    Hematuria    Hemorrhoids    History of bilateral cataract extraction 2018   HLD (hyperlipidemia)    HOH (hard of hearing)    bilateral hearing aids   HTN (hypertension)    Long term current use of amiodarone    Lymphocytic colitis    MVP (mitral valve prolapse) 04/30/2023   a.) TTE 04/30/2023: mild late systolic prolapse PLMV   On apixaban therapy    OSA (obstructive sleep apnea)    a.) no nocturnal PAP therapy; mild disease with AHI 5.8/hr   Pacing-induced cardiomyopathy (HCC)    a.) symptomatic bradycardia with RV pacing inducing cardiomypathy s/p Micra AV leadless PPM place --> device removed and dual chamber MDT XT DR MRI Surescan device placed 02/19/2023   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   a.) SA node dysfunction --> s/p Micra AV leadless PPM placement 09/30/2020; b.) developed symptomatic bradycardia with pacing induced cardiomyopathy --> Micra device explanted and new MDT Azure XT DR MRI SureScan (SN: [id number]) pdevice placed   Psoriasis    PSVT (paroxysmal supraventricular tachycardia) (HCC) 10/31/2022   a.) Zio 10/31/2022: 17 runs PSVT with longest last 1  min 35 secs and fastestest lasting 29.7 secs at a rate of 148 bpm   Recurrent syncope    Sinus pause    SSS (sick sinus syndrome) (HCC)     Surgical History: Past Surgical History:  Procedure Laterality Date   BIV PACEMAKER INSERTION CRT-P N/A 02/19/2023   Procedure: BIV PACEMAKER INSERTION CRT-P;  Surgeon: Lanier Prude, Knight;  Location: [facility] INVASIVE CV LAB;  Service: Cardiovascular;  Laterality: N/A;   CATARACT EXTRACTION Bilateral 2018   COLONOSCOPY WITH PROPOFOL N/A 09/25/2021   Procedure:  COLONOSCOPY WITH PROPOFOL;  Surgeon: Jaynie Collins, DO;  Location: [facility] ENDOSCOPY;  Service: Gastroenterology;  Laterality: N/A;   PACEMAKER LEADLESS INSERTION N/A 09/30/2020   Procedure: PACEMAKER LEADLESS INSERTION;  Surgeon: Marcina Millard, Knight;  Location: [facility] INVASIVE CV LAB;  Service: Cardiovascular;  Laterality: N/A;   TRANSURETHRAL RESECTION OF BLADDER TUMOR N/A 07/09/2023   Procedure: TRANSURETHRAL RESECTION OF BLADDER TUMOR (TURBT);  Surgeon: Collin Knight;  Location: [facility] ORS;  Service: Urology;  Laterality: N/A;    Home Medications:  Allergies as of 07/24/2023   No Known Allergies      Medication List        Accurate as of July 24, 2023  1:21 PM. If you have any questions, ask your nurse or doctor.          amiodarone 100 MG tablet Commonly known as: PACERONE TAKE 1 TABLET BY MOUTH EVERY DAY What changed: when to take this   apixaban 5 MG Tabs tablet Commonly known as: ELIQUIS Take 5 mg by mouth 2 (two) times daily.   dapagliflozin propanediol 10 MG Tabs tablet Commonly known as: Farxiga Take 1 tablet (10 mg total) by mouth daily before breakfast.   diazepam 5 MG tablet Commonly known as: VALIUM Take 1 tablet by mouth every 6 (six) hours as needed for anxiety.   furosemide 40 MG tablet Commonly known as: LASIX Take 1 tablet (40 mg total) by mouth as needed.   losartan 25 MG tablet Commonly known as: COZAAR Take 2 tablets (50 mg total) by mouth daily. What changed: when to take this   solifenacin 10 MG tablet Commonly known as: VESICARE Take 1 tablet (10 mg total) by mouth daily.   spironolactone 25 MG tablet Commonly known as: ALDACTONE Take 0.5 tablets (12.5 mg total) by mouth daily. What changed: when to take this   triamcinolone ointment 0.1 % Commonly known as: KENALOG Apply to affected area once daily x 4 weeks What changed:  how much to take when to take this   vitamin B-12 500 MCG tablet Commonly known as:  CYANOCOBALAMIN Take 500 mcg by mouth daily.   Vitamin D3 50 MCG (2000 UT) capsule Take 2,000 Units by mouth daily.        Allergies: No Known Allergies  Family History: Family History  Problem Relation Age of Onset   Stroke Mother    Cancer - Lung Father     Social History:  reports that he has quit smoking. His smoking use included cigarettes. He has never used smokeless tobacco. He reports current alcohol use of about 21.0 standard drinks of alcohol per week. He reports that he does not use drugs.   Physical Exam: BP 130/70   Pulse 74   Wt 151 lb (68.5 kg)   BMI 21.36 kg/m   Constitutional:  Alert and oriented, No acute distress. HEENT: [place] AT Respiratory: Normal respiratory effort, no increased work of breathing. Psychiatric: Normal mood and  affect.   Assessment & Plan:    1. 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 Due to the extent of his tumor, I have recommended restaging biopsies and he was in agreement with this plan.  The procedure was discussed in detail including potential risks of bleeding, infection, bladder perforation.  If no evidence of muscle invasion on restaging, I recommended a 6 week course of induction BCG. We discussed BCG installation and the most common side effects of irritative voiding symptoms, occasional flu-like symptoms and rarely BCG sepsis.  The need for monitoring with surveillance cystoscopy as well as possible recommendation of maintenance BCG was discussed. All questions were answered and he desires to proceed.  Catheter was removed today.   I have reviewed the above documentation for accuracy and completeness, and I agree with the above.   Collin Knight  [facility] [address] [place], [place] [address] [phone]

## 2023-07-28 ENCOUNTER — Other Ambulatory Visit: Payer: Self-pay | Admitting: Urology

## 2023-07-28 DIAGNOSIS — C679 Malignant neoplasm of bladder, unspecified: Secondary | ICD-10-CM

## 2023-07-29 ENCOUNTER — Other Ambulatory Visit: Payer: Self-pay

## 2023-07-29 DIAGNOSIS — C679 Malignant neoplasm of bladder, unspecified: Secondary | ICD-10-CM

## 2023-07-29 NOTE — Progress Notes (Unsigned)
Surgical Physician Order Form [facility] [place]  Dr. Irineo Axon, MD  * Scheduling expectation : 08/13/23  *Length of Case: 30 min  *Clearance needed: yes  *Anticoagulation Instructions: Hold all anticoagulants  *Aspirin Instructions: N/A  *Post-op visit Date/Instructions:  TBD  *Diagnosis: bladder cancer  *Procedure:  Cysto Bladder Biopsy ([address]); possible TURBT   Additional orders: N/A  -Admit type: OUTpatient  -Anesthesia: Choice  -VTE Prophylaxis Standing Order SCD's       Other:   -Standing Lab Orders Per Anesthesia    Lab other: UA&Urine Culture  -Standing Test orders EKG/Chest x-ray per Anesthesia       Test other:   - Medications:  Ancef 2gm IV  -Other orders:  N/A

## 2023-07-30 ENCOUNTER — Telehealth: Payer: Self-pay

## 2023-07-30 NOTE — Progress Notes (Signed)
   [facility] Surgical Posting Form  Surgery Date: Date: 08/13/2023  Surgeon: Dr. Irineo Axon, MD  Inpt ( No  )   Outpt (Yes)   Obs ( No  )   Diagnosis: C67.9 Bladder Cancer  -CPT: [address], [address]  Surgery: Cystoscopy with Bladder Biopsy, Possible Transurethral Resection of Bladder Tumor  Stop Anticoagulations: Yes, will need to hold Eliquis for 2 days prior to surgery. Clearance on file from last month, no need to get a new one per Pre-op Provider Quentin Mulling, NP  Cardiac/Medical/Pulmonary Clearance needed: No  *Orders entered into EPIC  Date: 07/30/23   *Case booked in EPIC  Date: 07/30/23  *Notified pt of Surgery: Date: 07/30/23  PRE-OP UA & CX: yes, will obtain in clinic on 07/31/2023  *Placed into Prior Authorization Work Angela Nevin Date: 07/30/23  Assistant/laser/rep:No

## 2023-07-30 NOTE — Telephone Encounter (Signed)
Per Dr. Lonna Cobb, Patient is to be scheduled for Cystoscopy with Bladder Biopsy, Possible Transurethral Resection of Bladder Tumor   Collin Knight was contacted and possible surgical dates were discussed, Tuesday October 22nd, 2024 was agreed upon for surgery.   Patient was instructed that Dr. Lonna Cobb will require them to provide a pre-op UA & CX prior to surgery. This was ordered and scheduled drop off appointment was made for 07/31/2023.    Patient was directed to call [phone] between 1-3pm the day before surgery to find out surgical arrival time.  Instructions were given not to eat or drink from midnight on the night before surgery and have a driver for the day of surgery. On the surgery day patient was instructed to enter through the Medical Mall entrance of [facility] report the Same Day Surgery desk.   Pre-Admit Testing will be in contact via phone to set up an interview with the anesthesia team to review your history and medications prior to surgery.   Reminder of this information was sent via MyChart to the patient.   Patient is to hold anticoag's per Dr. Darrold Junker.  Currently taking Eliquis by Dr. Darrold Junker Clearance requested to stop 2days prior to surgery.

## 2023-07-31 ENCOUNTER — Other Ambulatory Visit: Payer: Medicare Other

## 2023-07-31 DIAGNOSIS — C679 Malignant neoplasm of bladder, unspecified: Secondary | ICD-10-CM

## 2023-07-31 LAB — URINALYSIS, COMPLETE
Bilirubin, UA: NEGATIVE
Nitrite, UA: POSITIVE — AB
Specific Gravity, UA: 1.025 (ref 1.005–1.030)
Urobilinogen, Ur: 0.2 mg/dL (ref 0.2–1.0)
pH, UA: 5.5 (ref 5.0–7.5)

## 2023-07-31 LAB — MICROSCOPIC EXAMINATION
RBC, Urine: >30 /[HPF] — AB (ref 0–2)
WBC, UA: >30 /[HPF] — AB (ref 0–5)

## 2023-08-01 ENCOUNTER — Telehealth: Payer: Self-pay | Admitting: Urology

## 2023-08-01 NOTE — Telephone Encounter (Signed)
We are waiting for the culture to come back .

## 2023-08-01 NOTE — Telephone Encounter (Signed)
Patient dropped in office today regarding his urine results from 07/31/23. He saw results on mychart, and is concerned because of his upcoming surgery. He would like to know if he needs antibiotic.

## 2023-08-05 ENCOUNTER — Encounter
Admission: RE | Admit: 2023-08-05 | Discharge: 2023-08-05 | Disposition: A | Discharge status: Home / Self Care | Payer: Medicare Other | Source: Ambulatory Visit | Attending: Urology | Admitting: Urology

## 2023-08-05 VITALS — Ht 70.5 in | Wt 151.0 lb

## 2023-08-05 DIAGNOSIS — I10 Essential (primary) hypertension: Secondary | ICD-10-CM

## 2023-08-05 DIAGNOSIS — Z79899 Other long term (current) drug therapy: Secondary | ICD-10-CM

## 2023-08-05 DIAGNOSIS — I4891 Unspecified atrial fibrillation: Secondary | ICD-10-CM

## 2023-08-05 DIAGNOSIS — Z01812 Encounter for preprocedural laboratory examination: Secondary | ICD-10-CM

## 2023-08-05 HISTORY — DX: Malignant neoplasm of bladder, unspecified: C67.9

## 2023-08-05 LAB — CULTURE, URINE COMPREHENSIVE

## 2023-08-05 NOTE — Patient Instructions (Signed)
Your procedure is scheduled on:08-13-23 Tuesday Report to the Registration Desk on the 1st floor of the Medical Mall.Then proceed to the 2nd floor Surgery Desk To find out your arrival time, please call [phone] between 1PM - 3PM on:08-12-23 Monday If your arrival time is 6:00 am, do not arrive before that time as the Medical Mall entrance doors do not open until 6:00 am.  REMEMBER: Instructions that are not followed completely may result in serious medical risk, up to and including death; or upon the discretion of your surgeon and anesthesiologist your surgery may need to be rescheduled.  Do not eat food OR drink any liquids after midnight the night before surgery.  No gum chewing or hard candies.  One week prior to surgery: Stop Anti-inflammatories (NSAIDS) such as Advil, Aleve, Ibuprofen, Motrin, Naproxen, Naprosyn and Aspirin based products such as Excedrin, Goody's Powder, BC Powder. Stop ANY OVER THE COUNTER supplements/vitamins NOW (08-05-23) until after surgery (Vitamin B12 and D3)  You may however, continue to take Tylenol if needed for pain up until the day of surgery.  Stop your apixaban (ELIQUIS) 2 days prior to surgery-Last dose will be on 08-10-23 Saturday  Continue taking all of your other prescription medications up until the day of surgery.  ON THE DAY OF SURGERY ONLY TAKE THESE MEDICATIONS WITH SIPS OF WATER: -amiodarone (PACERONE)  -solifenacin (VESICARE)  -You may take diazepam (VALIUM) if needed for anxiety  No Alcohol for 24 hours before or after surgery.  No Smoking including e-cigarettes for 24 hours before surgery.  No chewable tobacco products for at least 6 hours before surgery.  No nicotine patches on the day of surgery.  Do not use any "recreational" drugs for at least a week (preferably 2 weeks) before your surgery.  Please be advised that the combination of cocaine and anesthesia may have negative outcomes, up to and including death. If you test  positive for cocaine, your surgery will be cancelled.  On the morning of surgery brush your teeth with toothpaste and water, you may rinse your mouth with mouthwash if you wish. Do not swallow any toothpaste or mouthwash.  Do not wear jewelry, make-up, hairpins, clips or nail polish.  For welded (permanent) jewelry: bracelets, anklets, waist bands, etc.  Please have this removed prior to surgery.  If it is not removed, there is a chance that hospital personnel will need to cut it off on the day of surgery.  Do not wear lotions, powders, or perfumes.   Do not shave body hair from the neck down 48 hours before surgery.  Contact lenses, hearing aids and dentures may not be worn into surgery.  Do not bring valuables to the hospital. [facility] is not responsible for any missing/lost belongings or valuables.   Notify your doctor if there is any change in your medical condition (cold, fever, infection).  Wear comfortable clothing (specific to your surgery type) to the hospital.  After surgery, you can help prevent lung complications by doing breathing exercises.  Take deep breaths and cough every 1-2 hours. Your doctor may order a device called an Incentive Spirometer to help you take deep breaths. When coughing or sneezing, hold a pillow firmly against your incision with both hands. This is called "splinting." Doing this helps protect your incision. It also decreases belly discomfort.  If you are being admitted to the hospital overnight, leave your suitcase in the car. After surgery it may be brought to your room.  In case of increased  patient census, it may be necessary for you, the patient, to continue your postoperative care in the Same Day Surgery department.  If you are being discharged the day of surgery, you will not be allowed to drive home. You will need a responsible individual to drive you home and stay with you for 24 hours after surgery.   If you are taking public  transportation, you will need to have a responsible individual with you.  Please call the Pre-admissions Testing Dept. at [phone] if you have any questions about these instructions.  Surgery Visitation Policy:  Patients having surgery or a procedure may have two visitors.  Children under the age of 18 must have an adult with them who is not the patient.

## 2023-08-05 NOTE — Pre-Procedure Instructions (Addendum)
Pt instructed to Continue Farxiga up until the day prior to surgery as pt is not diabetic and takes this for his heart (per Quentin Mulling NP)

## 2023-08-06 ENCOUNTER — Other Ambulatory Visit: Payer: Self-pay | Admitting: *Deleted

## 2023-08-06 MED ORDER — SULFAMETHOXAZOLE-TRIMETHOPRIM 800-160 MG PO TABS: 1.0000 | ORAL_TABLET | Freq: Two times a day (BID) | ORAL | 0 refills | Status: AC

## 2023-08-07 ENCOUNTER — Other Ambulatory Visit: Payer: Self-pay | Admitting: *Deleted

## 2023-08-07 ENCOUNTER — Encounter
Admission: RE | Admit: 2023-08-07 | Discharge: 2023-08-07 | Disposition: A | Discharge status: Home / Self Care | Payer: Medicare Other | Source: Ambulatory Visit | Attending: Urology | Admitting: Urology

## 2023-08-07 DIAGNOSIS — Z01812 Encounter for preprocedural laboratory examination: Secondary | ICD-10-CM

## 2023-08-07 DIAGNOSIS — Z01818 Encounter for other preprocedural examination: Secondary | ICD-10-CM | POA: Diagnosis present

## 2023-08-07 DIAGNOSIS — I4891 Unspecified atrial fibrillation: Secondary | ICD-10-CM | POA: Insufficient documentation

## 2023-08-07 DIAGNOSIS — Z79899 Other long term (current) drug therapy: Secondary | ICD-10-CM | POA: Insufficient documentation

## 2023-08-07 DIAGNOSIS — I10 Essential (primary) hypertension: Secondary | ICD-10-CM | POA: Diagnosis not present

## 2023-08-07 LAB — BASIC METABOLIC PANEL
Anion gap: 10 (ref 5–15)
BUN: 16 mg/dL (ref 8–23)
CO2: 21 mmol/L — ABNORMAL LOW (ref 22–32)
Calcium: 8.7 mg/dL — ABNORMAL LOW (ref 8.9–10.3)
Chloride: 103 mmol/L (ref 98–111)
Creatinine, Ser: 0.94 mg/dL (ref 0.61–1.24)
GFR, Estimated: >60 mL/min (ref >60)
Glucose, Bld: 84 mg/dL (ref 70–99)
Potassium: 4 mmol/L (ref 3.5–5.1)
Sodium: 134 mmol/L — ABNORMAL LOW (ref 135–145)

## 2023-08-07 MED ORDER — SOLIFENACIN SUCCINATE 10 MG PO TABS: 10.0000 mg | ORAL_TABLET | Freq: Every day | ORAL | 1 refills | Status: DC

## 2023-08-08 ENCOUNTER — Encounter: Payer: Self-pay | Admitting: Urology

## 2023-08-08 ENCOUNTER — Encounter: Payer: Self-pay | Admitting: Cardiology

## 2023-08-08 NOTE — Progress Notes (Signed)
PERIOPERATIVE PRESCRIPTION FOR IMPLANTED CARDIAC DEVICE PROGRAMMING  Patient Information: Name:  [facility].  DOB:  18-Jan-1946  MRN:  [id number]    Planned Procedure: CYSTOSCOPY WITH BLADDER BIOPSY; TRANSURETHRAL RESECTION OF BLADDER TUMOR (TURBT)    Surgeon:  Dr. Irineo Axon, MD  Requesting device clearance: Quentin Mulling, FNP-C  Date of Procedure:  08/13/2023  Cautery will be used.   Please route documentation back me via [facility], or may fax report to [facility] PAT APP at [phone].  Device Information:  Clinic EP Physician:  Dr. Steffanie Dunn   Device Type:  Pacemaker Manufacturer and Phone #:  Medtronic: [phone] Pacemaker Dependent?:  Yes.   Date of Last Device Check:  06/05/23 Normal Device Function?:  Yes.    Electrophysiologist's Recommendations:  Have magnet available. Provide continuous ECG monitoring when magnet is used or reprogramming is to be performed.  Procedure should not interfere with device function.  No device programming or magnet placement needed.  Per Device Clinic Standing Orders, Lenor Coffin, RN  3:04 PM 08/08/2023

## 2023-08-08 NOTE — Progress Notes (Signed)
Perioperative / Anesthesia Services  Pre-Admission Testing Clinical Review / Pre-Operative Anesthesia Consult  Date: 08/08/23  Patient Demographics:  Name: [facility]. DOB:   Oct 05, 1946 MRN:   [id number]  Planned Surgical Procedure(s):    Case: [id number] Date/Time: 08/13/23 1352   Procedures:      CYSTOSCOPY WITH BLADDER BIOPSY     TRANSURETHRAL RESECTION OF BLADDER TUMOR (TURBT)   Anesthesia type: Choice   Pre-op diagnosis: Bladder Cancer   Location: [facility] OR ROOM 10 / [facility] ORS FOR ANESTHESIA GROUP   Surgeons: Riki Altes, MD     NOTE: Available PAT nursing documentation and vital signs have been reviewed. Clinical nursing staff has updated patient's PMH/PSHx, current medication list, and drug allergies/intolerances to ensure comprehensive history available to assist in medical decision making as it pertains to the aforementioned surgical procedure and anticipated anesthetic course. Extensive review of available clinical information personally performed. [facility] PMH and PSHx updated with any diagnoses/procedures that  may have been inadvertently omitted during his intake with the pre-admission testing department's nursing staff.  Clinical Discussion:  Collin Wolinski Montez Hageman. is a 77 y.o. male who is submitted for pre-surgical anesthesia review and clearance prior to him undergoing the above procedure. Patient is a Former Games developer. Pertinent PMH includes: CAD, atrial fibrillation, SA node dysfunction/SSS (s/p PPM placement), pacing induced cardiomyopathy, CHF, PSVT, MVP, frequent PVCs, recurrent syncope, aortic atherosclerosis, HTN, HLD, COPD, OSAH (no nocturnal PAP therapy), bladder mass, anxiety (on BZO).  Patient is followed by cardiology Darrold Junker, MD). He was last seen in the cardiology clinic on 04/15/2023; notes reviewed. At the time of his clinic visit, patient doing well overall from a cardiovascular perspective.  Patient complaining of chronic exertional  dyspnea and generalized fatigue.  Symptoms are reported to be stable and at baseline.  Patient denied any chest pain, PND, orthopnea, palpitations, significant peripheral edema, weakness, vertiginous symptoms, or presyncope/syncope. Patient with a past medical history significant for cardiovascular diagnoses. Documented physical exam was grossly benign, providing no evidence of acute exacerbation and/or decompensation of the patient's known cardiovascular conditions.  Patient with sinus pauses and episodes of recurrent syncope.  Given SA node dysfunction, patient ultimately underwent placement of a Micra AV leadless pacemaker on 09/30/2020.  Following leadless pacemaker placement, patient developed RV pacing induced cardiomyopathy.  Patient experienced a significant reduction in his overall cardiac function as evidenced by a LVEF of 20-25%.  Ultimately, patient required explantation of the leadless pacemaker and insertion of a dual-chamber device (Medtronic Azure XT DR MRI SureScan) on 02/19/2023.  Pacemaker is regularly interrogated by patient's primary electrophysiology team.  Device last interrogated on 06/05/2023, at which time it was noted to be functioning properly.  Coronary CTA was performed on 12/25/2021 that demonstrated an Agatston coronary artery calcium score of 1121. This placed patient in the 29 percentile for age, sex, and race matched controls. Calcium depositions noted in the RCA and proximal to mid LAD (moderate), as well as in the ostial OM1 branch (mild) distributions.  FFR analysis was performed revealing no significant stenosis (ranges: < 0.75 high likelihood of hemodynamically significant stenosis, 0.76-0.80 borderline, > 0.80 normal):  Left Main:  No significant stenosis. LAD: No significant stenosis.  FFRct 0.84 LCX: No significant stenosis.  FFRct 0.95 RCA: No significant stenosis.  FFRct 0.85  Long-term cardiac event monitor study performed on 10/31/2022 was difficult to  ascertain.  Rhythm suspected to be fibrillation with ventricular pacing versus sinus bradycardia with prolonged AV block.  There were 17  runs of PSVT noted with the fastest lasting 29.7 seconds at a maximum rate of 148 bpm, and the longest lasting 1 minute and 35 seconds at an average rate of 97 bpm.  There were no sustained pauses or significant arrhythmias noted.  Most recent TTE was performed on 04/30/2023 revealing a low normal left ventricular systolic function with an EF of 50-55%.  There were no regional wall motion abnormalities.  There was mild LVH. Left ventricular diastolic Doppler parameters consistent with abnormal relaxation (G1DD).  GLS -17.4%.  Right ventricular size and function normal.  There was mild mitral valve regurgitation noted in the setting of mild late systolic prolapse of the posterior leaflet of the mitral valve.  All transvalvular gradients were noted to be normal providing no evidence of valvular stenosis.  There was mild dilatation of the abdominal aorta measuring up to 32 mm.  Patient with an atrial fibrillation diagnosis; CHA2DS2-VASc Score = 5 (age x 2, CHF, HTN, vascular disease history).currently, his cardiac rate and rhythm are currently being maintained on oral amiodarone. He was being chronically anticoagulated using warfarin; reported to be compliant with therapy with no evidence or reports of GI/GU bleeding.  Blood pressure well controlled at 130/80 mmHg on currently prescribed diuretic (furosemide + spironolactone) and ARB (losartan) therapies.  Patient not currently taking any type of lipid-lowering therapies for his HLD diagnosis and ASCVD prevention.  Patient is not diabetic. Given his cardiovascular diagnoses, patient is on an SGLT2i (dapagliflozin) for added cardiovascular and renovascular protection. Functional capacity somewhat limited by patient's age and multiple medical comorbidities.  That said, patient is able to complete all of his ADLs/IADLs without  significant cardiovascular limitation.  Patient is felt to be able to achieve at least 4 METS of physical activity without experiencing any significant degree of angina/anginal equivalent symptoms.  Warfarin was discontinued and patient was started on DOAC therapy (standard dose apixaban).  No other changes were made to his medication regimen.  Patient to follow-up with outpatient cardiology in 3 months or sooner if needed.  [facility] [place]. is scheduled for an CYSTOSCOPY WITH BLADDER BIOPSY; TRANSURETHRAL RESECTION OF BLADDER TUMOR (TURBT) on 07/09/2023 with Dr. Irineo Axon, MD.  Given patient's past medical history significant for cardiovascular diagnoses, presurgical cardiac clearance was sought by the PAT team. Per cardiology, "based ACC/AHA guidelines, the patient's past medical history, and the amount of time since his last clinic visit, this patient would be at an overall ACCEPTABLE risk for the planned procedure without further cardiovascular testing or intervention at this time".    Again, this patient is on daily oral anticoagulation therapy using a DOAC medication.  He has been instructed on recommendations from his cardiologist for holding his apixaban for 2 days prior to his procedure with plans to restart since postoperative bleeding risk felt to be minimized by his primary attending surgeon.  Patient is aware that his last dose of apixaban should be on 08/10/2023.  Patient denies previous perioperative complications with anesthesia in the past. In review of the available records, it is noted that patient underwent a general anesthetic course here at [facility] ([facility]) in 06/2023 without documented complications.      08/05/2023    2:00 PM 07/24/2023   10:33 AM 07/11/2023    9:44 AM  Vitals with BMI  Height 5' 10.5"    Weight 151 lbs 151 lbs   BMI 21.35 21.35   Systolic  130 118  Diastolic  70  72  Pulse  74 143    Providers/Specialists:    NOTE: Primary physician provider listed below. Patient may have been seen by APP or partner within same practice.   PROVIDER ROLE / SPECIALTY LAST Winfield Cunas, MD Urology (Surgeon) 07/24/2023  Jerl Mina, MD Primary Care Provider 12/14/2021  Marcina Millard, MD Cardiology 04/15/2023  Arvilla Meres, MD Advanced Heart Failure 05/30/2023; update preop APP call on 07/01/2023   Allergies:  Patient has no known allergies.  Current Home Medications:   No current facility-administered medications for this encounter.    amiodarone (PACERONE) 100 MG tablet   apixaban (ELIQUIS) 5 MG TABS tablet   Cholecalciferol (VITAMIN D3) 50 MCG (2000 UT) capsule   dapagliflozin propanediol (FARXIGA) 10 MG TABS tablet   diazepam (VALIUM) 5 MG tablet   furosemide (LASIX) 40 MG tablet   losartan (COZAAR) 25 MG tablet   solifenacin (VESICARE) 10 MG tablet   spironolactone (ALDACTONE) 25 MG tablet   sulfamethoxazole-trimethoprim (BACTRIM DS) 800-160 MG tablet   triamcinolone ointment (KENALOG) 0.1 %   vitamin B-12 (CYANOCOBALAMIN) 500 MCG tablet   History:   Past Medical History:  Diagnosis Date   Alcohol use disorder, mild, abuse    10-12 oz wine daily   Anxiety    a.) on BZO (diazepam) PRN   Aortic atherosclerosis (HCC)    Bladder mass    a.) CT hematuria 06/06/2023: 4.7 cm RIGHT posterior bladder mass --> s/p TURBT 07/09/2023 --> pathology (+) for high grade urothelial carcinoma with (+) invasion into the lamina propria   CAD (coronary artery disease) 12/25/2021   a.) cCTA 12/25/2021: Ca2+ 1121 (76th %'ile for age/sex/race match control); mild oOM1, mod RCA and p-mLAD disease   Chronic atrial fibrillation (HCC)    a.) CHA2DS2-VASc = 5 (age x2, CHF, HTN, vascular disease history) as of 08/08/2023; b.) cardiac rate/rhythm maintained on oral amiodardone; chronically anticoagulated using apixaban   Chronic systolic CHF (congestive heart failure) (HCC)    a.) TTE 09/29/2020: EF  50-55%, no RWMAs, mild MR; b.) TTE 11/22/2021: EF 20-25%, mild RVE with reduced RVSF, mild LAE, mod RAE; c.) TTE 12/14/2022: EF 25-30%, glob HK, mild LVH, mod MR, mild-mod TR, mild AR, Ao root 39mm; d.) TTE 04/30/2023: EF 50-55%, mild MR, mild late systolic prolapse PLMV   COPD (chronic obstructive pulmonary disease) (HCC)    Depression    Dyspnea    Frequent PVCs    Hematuria    Hemorrhoids    History of bilateral cataract extraction 2018   HLD (hyperlipidemia)    HOH (hard of hearing) - uses BILATERAL hearing aids    HTN (hypertension)    Long term current use of amiodarone    Lymphocytic colitis    MVP (mitral valve prolapse) 04/30/2023   a.) TTE 04/30/2023: mild late systolic prolapse PLMV   On apixaban therapy    OSA (obstructive sleep apnea)    a.) no nocturnal PAP therapy; mild disease with AHI 5.8/hr   Pacing-induced cardiomyopathy (HCC)    a.) symptomatic bradycardia with RV pacing inducing cardiomypathy s/p Micra AV leadless PPM place --> device removed and dual chamber MDT XT DR MRI Surescan device placed 02/19/2023   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   a.) SA node dysfunction --> s/p Micra AV leadless PPM placement 09/30/2020; b.) developed symptomatic bradycardia with pacing induced cardiomyopathy --> Micra device explanted and new MDT Azure XT DR MRI SureScan (SN: [id number]) pdevice placed   Psoriasis    PSVT (  paroxysmal supraventricular tachycardia) (HCC) 10/31/2022   a.) Zio 10/31/2022: 17 runs PSVT with longest last 1 min 35 secs and fastestest lasting 29.7 secs at a rate of 148 bpm   Recurrent syncope    Sinus pause    SSS (sick sinus syndrome) (HCC)    a.) s/p PPM placement   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[facility])    Past Surgical History:  Procedure Laterality Date   BIV PACEMAKER INSERTION CRT-P N/A 02/19/2023   Procedure: BIV PACEMAKER INSERTION CRT-P;  Surgeon: Lanier Prude, MD;  Location: [facility] INVASIVE CV LAB;  Service: Cardiovascular;   Laterality: N/A;   CATARACT EXTRACTION Bilateral 2018   COLONOSCOPY WITH PROPOFOL N/A 09/25/2021   Procedure: COLONOSCOPY WITH PROPOFOL;  Surgeon: Jaynie Collins, DO;  [facility];  Service: Gastroenterology;  Laterality: N/A;   PACEMAKER LEADLESS INSERTION N/A 09/30/2020   Procedure: PACEMAKER LEADLESS INSERTION;  Surgeon: Marcina Millard, MD;  Location: [facility] INVASIVE CV LAB;  Service: Cardiovascular;  Laterality: N/A;   TRANSURETHRAL RESECTION OF BLADDER TUMOR N/A 07/09/2023   Procedure: TRANSURETHRAL RESECTION OF BLADDER TUMOR (TURBT);  Surgeon: Riki Altes, MD;  Location: [facility] ORS;  Service: Urology;  Laterality: N/A;   Family History  Problem Relation Age of Onset   Stroke Mother    Cancer - Lung Father    Social History   Tobacco Use   Smoking status: Former    Types: Cigarettes   Smokeless tobacco: Never  Vaping Use   Vaping status: Never Used  Substance Use Topics   Alcohol use: Yes    Alcohol/week: 21.0 standard drinks of alcohol    Types: 21 Cans of beer per week    Comment: 10-12 oz wine per day   Drug use: Never    Pertinent Clinical Results:  LABS:   Lab Results  Component Value Date   WBC 5.7 05/07/2023   HGB 14.4 05/07/2023   HCT 43.0 05/07/2023   MCV 104.6 (H) 05/07/2023   PLT 245 05/07/2023   [facility] Visit on 08/07/2023  Component Date Value Ref Range Status   Sodium 08/07/2023 134 (L)  135 - 145 mmol/L Final   Potassium 08/07/2023 4.0  3.5 - 5.1 mmol/L Final   Chloride 08/07/2023 103  98 - 111 mmol/L Final   CO2 08/07/2023 21 (L)  22 - 32 mmol/L Final   Glucose, Bld 08/07/2023 84  70 - 99 mg/dL Final   Glucose reference range applies only to samples taken after fasting for at least 8 hours.   BUN 08/07/2023 16  8 - 23 mg/dL Final   Creatinine, Ser 08/07/2023 0.94  0.61 - 1.24 mg/dL Final   Calcium 86/57/8469 8.7 (L)  8.9 - 10.3 mg/dL Final   GFR, Estimated 08/07/2023 >60  >60 mL/min Final   Comment:  (NOTE) Calculated using the CKD-EPI Creatinine Equation (2021)    Anion gap 08/07/2023 10  5 - 15 Final   Performed at [facility], [address] [place], [place] [address]    ECG: Date: 07/04/2023 Time ECG obtained: 1426 PM Rate: 61 bpm Rhythm:  Atrial paced rhythm with prolonged AV conduction Axis (leads I and aVF): Left axis deviation Intervals: PR 256 ms. QRS 86 ms. QTc 438 ms. ST segment and T wave changes: No evidence of acute ST segment elevation or depression.  Evidence of a possible age undetermined inferior and anterior infarcts present. Comparison: Similar to previous tracing obtained on 03/25/2023   IMAGING / PROCEDURES: CT HEMATURIA WORKUP performed  on 06/06/2023 4.7 cm right posterior bladder mass, compatible with primary bladder carcinoma. No evidence of metastatic disease in the abdomen/pelvis. 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  TRANSTHORACIC ECHOCARDIOGRAM performed on 04/30/2023 Left ventricular ejection fraction, by estimation, is 50 to 55%. The left ventricle has low normal function. The left ventricle has no regional wall motion abnormalities. There is mild left ventricular hypertrophy. Left ventricular diastolic parameters are consistent with Grade I diastolic dysfunction (impaired relaxation). The average left ventricular global longitudinal strain is -17.4 %. The global longitudinal strain is normal.  Right ventricular systolic function is normal. The right ventricular size is normal. Tricuspid regurgitation signal is inadequate for assessing PA pressure.  The mitral valve is abnormal. Mild mitral valve regurgitation. No evidence of mitral stenosis. 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  The aortic valve is tricuspid. Aortic valve regurgitation is not visualized. No aortic stenosis is present.  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  The inferior vena cava is  normal in size with greater than 50% respiratory variability, suggesting right atrial pressure of 3 mmHg.   LONG TERM CARDIAC EVENT MONITOR STUDY performed on  10/31/2022 Patch Wear Time:  13 days and 19 hours (2023-12-18T16:09:56-0500 to 2024-01-01T11:27:41-0500) Underlying rhythm difficult to ascertain suspect AF with ventricular pacing but may be sinus brady with prolonged AV block- avg HR of 50 bpm.  17 runs of Supraventricular Tachycardia occurred, the run with the fastest interval lasting 29.7 secs with a max rate of 148 bpm, the longest lasting 1 min 35 secs with an avg rate of 97 bpm.  Rare PACs and PVCs.  CT CORONARY MORPH Collin/CTA COR Collin/SCORE Collin/CA Collin/CM &/OR WO/CM Collin/ FRACTIONAL FLOW RESERVE DATA PREP performed on 12/25/2021 Coronary calcium score of 1121. This was 76th percentile for age and sex matched control. Normal coronary origin with right dominance. Moderate stenosis in the RCA and proximal-mid LAD. Mild stenosis in the ostial OM1 branch. CAD-RADS 3. Moderate stenosis. Consider symptom-guided anti-ischemic pharmacotherapy as well as risk factor modification per guideline directed care. FFR analysis (ranges: < 0.75 high likelihood of hemodynamically significant stenosis, 0.76-0.80 borderline, > 0.80 normal): Left Main:  No significant stenosis. LAD: No significant stenosis.  FFRct 0.84 LCX: No significant stenosis.  FFRct 0.95 RCA: No significant stenosis.  FFRct 0.85 Small BILATERAL pleural effusions with compressive atelectasis of the adjacent lower lobes. Underlying emphysematous and bronchitic changes consistent with COPD. Upper normal caliber ascending thoracic aorta 3.9 cm diameter. Aortic atherosclerosis  Impression and Plan:  [facility] has been referred for pre-anesthesia review and clearance prior to him undergoing the planned anesthetic and procedural courses. Available labs, pertinent testing, and imaging results were personally reviewed by me in  preparation for upcoming operative/procedural course. [facility] medical record has been updated following extensive record review and patient interview with PAT staff.   This patient has been appropriately cleared by cardiology with an overall ACCEPTABLE risk of experiencing significant perioperative cardiovascular complications. Completed perioperative prescription for cardiac device management documentation completed by primary cardiology team and placed on patient's chart for review by the surgical/anesthetic team on the day of his procedure. Electrophysiology indicating that procedure should not interfere with planned surgical procedure. Beyond normal perioperative cardiovascular monitoring, there are no recommendations from electrophysiology team that prompt further discussion/recommendations from industry representative.   Based on clinical review performed today (08/08/23), barring any significant acute changes in the patient's overall condition, it is  anticipated that he will be able to proceed with the planned surgical intervention. Any acute changes in clinical condition may necessitate his procedure being postponed and/or cancelled. Patient will meet with anesthesia team (MD and/or CRNA) on the day of his procedure for preoperative evaluation/assessment. Questions regarding anesthetic course will be fielded at that time.   Pre-surgical instructions were reviewed with the patient during his PAT appointment, and questions were fielded to satisfaction by PAT clinical staff. He has been instructed on which medications that he will need to hold prior to surgery, as well as the ones that have been deemed safe/appropriate to take on the day of his procedure. As part of the general education provided by PAT, patient made aware both verbally and in writing, that he would need to abstain from the use of any illegal substances during his perioperative course.  He was advised that failure to follow the  provided instructions could necessitate case cancellation or result in serious perioperative complications up to and including death. Patient encouraged to contact PAT and/or his surgeon's office to discuss any questions or concerns that may arise prior to surgery; verbalized understanding.   Quentin Mulling, MSN, APRN, FNP-C, CEN [facility]  Perioperative Services Nurse Practitioner Phone: [phone] Fax: [phone] 08/08/23 3:07 PM  NOTE: This note has been prepared using Dragon dictation software. Despite my best ability to proofread, there is always the potential that unintentional transcriptional errors may still occur from this process.

## 2023-08-12 MED ORDER — CHLORHEXIDINE GLUCONATE 0.12 % MT SOLN
15.0000 mL | Freq: Once | OROMUCOSAL | Status: AC
  Administered 2023-08-13: 15 mL via OROMUCOSAL

## 2023-08-12 MED ORDER — LACTATED RINGERS IV SOLN: INTRAVENOUS | Status: DC

## 2023-08-12 MED ORDER — CEFAZOLIN SODIUM-DEXTROSE 2-4 GM/100ML-% IV SOLN
2.0000 g | INTRAVENOUS | Status: AC
  Administered 2023-08-13: 2 g via INTRAVENOUS

## 2023-08-12 MED ORDER — ORAL CARE MOUTH RINSE: 15.0000 mL | Freq: Once | OROMUCOSAL | Status: AC

## 2023-08-13 ENCOUNTER — Ambulatory Visit
Admission: RE | Admit: 2023-08-13 | Discharge: 2023-08-13 | Disposition: A | Discharge status: Home / Self Care | Payer: Medicare Other | Source: Ambulatory Visit | Attending: Urology | Admitting: Urology

## 2023-08-13 ENCOUNTER — Encounter
Admission: RE | Disposition: A | Discharge status: Home / Self Care | Payer: Self-pay | Source: Ambulatory Visit | Attending: Urology

## 2023-08-13 ENCOUNTER — Ambulatory Visit: Payer: Medicare Other | Admitting: Urgent Care

## 2023-08-13 ENCOUNTER — Ambulatory Visit: Payer: Medicare Other

## 2023-08-13 ENCOUNTER — Encounter: Payer: Self-pay | Admitting: Urology

## 2023-08-13 DIAGNOSIS — N3080 Other cystitis without hematuria: Secondary | ICD-10-CM | POA: Diagnosis not present

## 2023-08-13 DIAGNOSIS — C679 Malignant neoplasm of bladder, unspecified: Secondary | ICD-10-CM | POA: Diagnosis present

## 2023-08-13 DIAGNOSIS — C661 Malignant neoplasm of right ureter: Secondary | ICD-10-CM | POA: Diagnosis not present

## 2023-08-13 DIAGNOSIS — N2889 Other specified disorders of kidney and ureter: Secondary | ICD-10-CM | POA: Insufficient documentation

## 2023-08-13 DIAGNOSIS — I110 Hypertensive heart disease with heart failure: Secondary | ICD-10-CM | POA: Insufficient documentation

## 2023-08-13 DIAGNOSIS — N3081 Other cystitis with hematuria: Secondary | ICD-10-CM | POA: Insufficient documentation

## 2023-08-13 HISTORY — PX: CYSTOSCOPY WITH BIOPSY: SHX5122

## 2023-08-13 HISTORY — PX: TRANSURETHRAL RESECTION OF BLADDER TUMOR: SHX2575

## 2023-08-13 HISTORY — PX: CYSTOSCOPY W/ URETERAL STENT PLACEMENT: SHX1429

## 2023-08-13 SURGERY — CYSTOSCOPY, WITH BIOPSY
Anesthesia: General | Laterality: Right

## 2023-08-13 MED ORDER — GLYCOPYRROLATE 0.2 MG/ML IJ SOLN
INTRAMUSCULAR | Status: DC | PRN
  Administered 2023-08-13: .2 mg via INTRAVENOUS

## 2023-08-13 MED ORDER — IOHEXOL 180 MG/ML  SOLN
INTRAMUSCULAR | Status: DC | PRN
  Administered 2023-08-13: 20 mL

## 2023-08-13 MED ORDER — OXYCODONE HCL 5 MG PO TABS: 5.0000 mg | ORAL_TABLET | Freq: Once | ORAL | Status: DC | PRN

## 2023-08-13 MED ORDER — ACETAMINOPHEN 10 MG/ML IV SOLN: 1000.0000 mg | Freq: Once | INTRAVENOUS | Status: DC | PRN

## 2023-08-13 MED ORDER — DEXAMETHASONE SODIUM PHOSPHATE 10 MG/ML IJ SOLN
INTRAMUSCULAR | Status: DC | PRN
  Administered 2023-08-13: 10 mg via INTRAVENOUS

## 2023-08-13 MED ORDER — ONDANSETRON HCL 4 MG/2ML IJ SOLN: 4.0000 mg | Freq: Once | INTRAMUSCULAR | Status: DC | PRN

## 2023-08-13 MED ORDER — FENTANYL CITRATE (PF) 100 MCG/2ML IJ SOLN: 25.0000 ug | INTRAMUSCULAR | Status: DC | PRN

## 2023-08-13 MED ORDER — LIDOCAINE HCL (CARDIAC) PF 100 MG/5ML IV SOSY
PREFILLED_SYRINGE | INTRAVENOUS | Status: DC | PRN
  Administered 2023-08-13: 100 mg via INTRAVENOUS

## 2023-08-13 MED ORDER — ACETAMINOPHEN 10 MG/ML IV SOLN
INTRAVENOUS | Status: DC | PRN
  Administered 2023-08-13: 1000 mg via INTRAVENOUS

## 2023-08-13 MED ORDER — ROCURONIUM BROMIDE 100 MG/10ML IV SOLN
INTRAVENOUS | Status: DC | PRN
  Administered 2023-08-13: 5 mg via INTRAVENOUS
  Administered 2023-08-13: 50 mg via INTRAVENOUS

## 2023-08-13 MED ORDER — SUCCINYLCHOLINE CHLORIDE 200 MG/10ML IV SOSY
PREFILLED_SYRINGE | INTRAVENOUS | Status: DC | PRN
  Administered 2023-08-13: 100 mg via INTRAVENOUS

## 2023-08-13 MED ORDER — ONDANSETRON HCL 4 MG/2ML IJ SOLN
INTRAMUSCULAR | Status: DC | PRN
  Administered 2023-08-13 (×2): 4 mg via INTRAVENOUS

## 2023-08-13 MED ORDER — PROPOFOL 10 MG/ML IV BOLUS
INTRAVENOUS | Status: DC | PRN
  Administered 2023-08-13: 150 mg via INTRAVENOUS

## 2023-08-13 MED ORDER — CEFAZOLIN SODIUM-DEXTROSE 2-4 GM/100ML-% IV SOLN
INTRAVENOUS | Status: AC
Start: 2023-08-13 — End: ?
  Filled 2023-08-13: qty 100

## 2023-08-13 MED ORDER — SUGAMMADEX SODIUM 200 MG/2ML IV SOLN
INTRAVENOUS | Status: DC | PRN
  Administered 2023-08-13: 200 mg via INTRAVENOUS

## 2023-08-13 MED ORDER — OXYCODONE HCL 5 MG/5ML PO SOLN: 5.0000 mg | Freq: Once | ORAL | Status: DC | PRN

## 2023-08-13 MED ORDER — LACTATED RINGERS IV SOLN: INTRAVENOUS | Status: AC

## 2023-08-13 MED ORDER — CHLORHEXIDINE GLUCONATE 0.12 % MT SOLN
OROMUCOSAL | Status: AC
  Filled 2023-08-13: qty 15

## 2023-08-13 MED ORDER — SODIUM CHLORIDE 0.9 % IR SOLN
Status: DC | PRN
  Administered 2023-08-13: 6000 mL

## 2023-08-13 MED ORDER — FENTANYL CITRATE (PF) 100 MCG/2ML IJ SOLN
INTRAMUSCULAR | Status: AC
  Filled 2023-08-13: qty 2

## 2023-08-13 SURGICAL SUPPLY — 29 items
BAG DRAIN SIEMENS DORNER NS (MISCELLANEOUS) ×2 IMPLANT
BAG DRN NS LF (MISCELLANEOUS) ×2
BRUSH SCRUB EZ 4% CHG (MISCELLANEOUS) IMPLANT
CATH FOLEY 2WAY 18X30 (CATHETERS) IMPLANT
CATH FOLEY 2WAY SIL 18X30 (CATHETERS)
CATH ROBINSON RED A/P 18FR (CATHETERS) IMPLANT
CATH URETL OPEN 5X70 (CATHETERS) IMPLANT
DRAPE UTILITY 15X26 TOWEL STRL (DRAPES) ×2 IMPLANT
DRSG TELFA 3X4 N-ADH STERILE (GAUZE/BANDAGES/DRESSINGS) ×2 IMPLANT
ELECT REM PT RETURN 9FT ADLT (ELECTROSURGICAL) ×2
ELECTRODE REM PT RTRN 9FT ADLT (ELECTROSURGICAL) ×2 IMPLANT
GLOVE BIOGEL PI IND STRL 7.5 (GLOVE) ×2 IMPLANT
GOWN STRL REUS W/ TWL LRG LVL3 (GOWN DISPOSABLE) ×4 IMPLANT
GOWN STRL REUS W/ TWL XL LVL3 (GOWN DISPOSABLE) ×2 IMPLANT
GOWN STRL REUS W/TWL LRG LVL3 (GOWN DISPOSABLE) ×4
GOWN STRL REUS W/TWL XL LVL3 (GOWN DISPOSABLE) ×2
GOWN STRL REUS W/TWL XL LVL4 (GOWN DISPOSABLE) ×2 IMPLANT
GUIDEWIRE STR DUAL SENSOR (WIRE) IMPLANT
IV NS IRRIG 3000ML ARTHROMATIC (IV SOLUTION) ×4 IMPLANT
KIT TURNOVER CYSTO (KITS) ×2 IMPLANT
LOOP CUT BIPOLAR 24F LRG (ELECTROSURGICAL) IMPLANT
NDL SAFETY ECLIPSE 18X1.5 (NEEDLE) ×2 IMPLANT
PACK CYSTO AR (MISCELLANEOUS) ×2 IMPLANT
SET IRRIG Y TYPE TUR BLADDER L (SET/KITS/TRAYS/PACK) ×2 IMPLANT
STENT URET 6FRX24 CONTOUR (STENTS) IMPLANT
SURGILUBE 2OZ TUBE FLIPTOP (MISCELLANEOUS) ×2 IMPLANT
SYR TOOMEY IRRIG 70ML (MISCELLANEOUS) ×2
SYRINGE TOOMEY IRRIG 70ML (MISCELLANEOUS) ×2 IMPLANT
WATER STERILE IRR 500ML POUR (IV SOLUTION) ×2 IMPLANT

## 2023-08-13 NOTE — Op Note (Signed)
Preoperative diagnosis:  T1 urothelial carcinoma bladder  Postoperative diagnosis:  Urothelial carcinoma bladder Urothelial carcinoma distal ureter  Procedure: Transurethral section bladder tumor (< 2 cm) Right ureteroscopy-diagnostic Right ureteral stent placement Right retrograde pyelogram with interpretation  Surgeon: Riki Altes, MD  Anesthesia: General  Complications: None  Intraoperative findings:  Cystoscopy: Distal urethra tight to a 21 French cystoscope; prominent lateral lobe enlargement prostatic urethra.  Prior resection site with papillary tumor surrounding the right ureteral orifice.  No other tumor identified in resection bed reepithelializing. Right ureteroscopy: Papillary tumor extending into the right distal ureter length of ~ 5 mm Right retrograde pyelogram: No filling defects renal pelvis or calyces.  EBL: Minimal  Specimens: None  Indication: [facility] Montez Hageman. is a 77 y.o. status post TURBT 07/09/2023 for a 6 cm papillary bladder tumor which returned high-grade T1 urothelial carcinoma the bladder.  He presents today for second look cystoscopy with restaging bladder biopsies.  After reviewing the management options for treatment, he elected to proceed with the above surgical procedure(s). We have discussed the potential benefits and risks of the procedure, side effects of the proposed treatment, the likelihood of the patient achieving the goals of the procedure, and any potential problems that might occur during the procedure or recuperation. Informed consent has been obtained.  Description of procedure:  The patient was taken to the operating room and general anesthesia was induced.  The patient was placed in the dorsal lithotomy position, prepped and draped in the usual sterile fashion, and preoperative antibiotics were administered. A preoperative time-out was performed.   The urethra would not except a 21 French cystoscope however the cystoscope  sheath with obturator was inserted with minimal difficulty.  Once in the distal urethra the obturator was replaced with a 30 degree lens and advanced into the bladder under direct vision with findings as described above.  The cystoscope was removed and the distal urethra was dilated with [employer] sounds from 20-28 [place].  A 26 French continuous-flow resectoscope sheath with obturator was then advanced into the bladder without difficulty.  Rigid biopsy forceps were placed through the sheath and biopsies of the tumor bed were obtained.  The rigid forceps were replaced with an Wandra Scot resectoscope with loop and biopsy sites were then cauterized.  The resectoscope was replaced with a laser bridge and a 0.038 Sensor wire was placed into the right ureteral orifice and advanced proximally without difficulty.  The resectoscope was removed and a 4.5 French semirigid ureteroscope was passed per urethra.  The right ureter was easily engaged with the ureteroscope and there was papillary tumor in the most distal portion of the ureter.  The ureteroscope was removed.  The guidewire was then removed and the resectoscope sheath with obturator was replaced.  The right ureteral orifice was then resected.  The resection site was examined and there was a small amount of papillary tissue remaining which was further resected.  No abnormal tumor was then identified and the ureter.    The resectoscope sheath was then removed and replaced with a laser bridge.  The guidewire was replaced in the right ureter and advanced to the region of the renal pelvis under fluoroscopic guidance.  A 5 French open-ended ureteral catheter was then placed over the wire into the mid ureter and retrograde pyelogram was performed with findings as described above.  The guidewire was replaced and the ureteral catheter was removed.  A 101F/24 cm Contour ureteral stent was then placed into the right  ureter with good positioning noted in renal pelvis under  fluoroscopy and bladder under direct vision.  The bladder was then reassessed and hemostasis was noted to be excellent.  Close inspection of the mucosa globally demonstrated no visible tumor.  The resectoscope was removed.  The patient was transported to the PACU in stable condition.  Plan: Scheduled to begin BCG early November Stent will remain indwelling during his BCG course  Irineo Axon, MD  Riki Altes, M.D.

## 2023-08-13 NOTE — Anesthesia Preprocedure Evaluation (Addendum)
Anesthesia Evaluation  Patient identified by MRN, date of birth, ID band Patient awake    Reviewed: Allergy & Precautions, NPO status , Patient's Chart, lab work & pertinent test results  History of Anesthesia Complications Negative for: history of anesthetic complications  Airway Mallampati: III   Neck ROM: Full    Dental  (+) Missing, Chipped   Pulmonary sleep apnea , COPD, former smoker (quit greater than 30 years ago)   Pulmonary exam normal breath sounds clear to auscultation       Cardiovascular hypertension, + CAD and +CHF (EF 50-55%)  Normal cardiovascular exam+ dysrhythmias (a fib on Eliquis; PSVT) + pacemaker (SSS) + Valvular Problems/Murmurs MVP  Rhythm:Regular Rate:Normal  ECG 07/04/23: A-paced with prolonged AV conduction; LAD; inferior and anterior infarcts, age undetermined  Echo 04/30/23:   1. Left ventricular ejection fraction, by estimation, is 50 to 55%. The left ventricle has low normal function. The left ventricle has no regional wall motion abnormalities. There is mild left ventricular hypertrophy. Left ventricular diastolic parameters are consistent with Grade I diastolic dysfunction (impaired relaxation). The average left ventricular global longitudinal strain is -17.4 %. The global longitudinal strain is normal.   2. Right ventricular systolic function is normal. The right ventricular size is normal. Tricuspid regurgitation signal is inadequate for assessing PA pressure.   3. The mitral valve is abnormal. Mild mitral valve regurgitation. No evidence of mitral stenosis. 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   4. The aortic valve is tricuspid. Aortic valve regurgitation is not visualized. No aortic stenosis is present.   5. 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   6. The inferior vena cava is normal in size with greater than 50% respiratory variability,  suggesting right atrial pressure of 3 mmHg.  Comparison(s): A prior study was performed on 12/14/2022. The left ventricular function has improved.    Neuro/Psych  PSYCHIATRIC DISORDERS Anxiety Depression    HOH; alcohol use disorder, 10-12 oz wine daily    GI/Hepatic negative GI ROS,,,  Endo/Other  negative endocrine ROS    Renal/GU negative Renal ROS     Musculoskeletal   Abdominal   Peds  Hematology negative hematology ROS (+)   Anesthesia Other Findings Reviewed and agree with Edd Fabian pre-anesthesia clinical review note.    EP note 08/08/23:  Electrophysiologist's Recommendations:  Have magnet available.  Provide continuous ECG monitoring when magnet is used or reprogramming is to be performed.   Procedure should not interfere with device function.  No device programming or magnet placement needed.   Cardiology note 05/30/23:  #) SND #) pacemaker-induced cardiomyopathy with leadless PPM #) s/p dual chamber PPM  Recent dual chamber PPM implant with left bundle area lead after unable to implant CRT-P Device functioning well, see paceart for details   #) persis Afib Low overall afib burden On amidoarone long-term LFT and thyroid labs stable on 04/2023 labs Continue 100mg  amiodarone daily   #) Hypercoag d/t persis afib CHA2DS2-VASc Score = 3 [CHF History: 1, HTN History: 0, Diabetes History: 0, Stroke History: 0, Vascular Disease History: 0, Age Score: 2, Gender Score: 0].  Therefore, the patient's annual risk of stroke is 3.2 %.    Stroke ppx - 5mg  eliquis, appropriately dosed No bleeding concerns   #)HFimpEF NYHA I-II symptoms Warm and dry on exam, euvolemic GDMT: farxiga, losartan 25, spiro 12.5 Diuretic: 40mg  daily, pt takes PRN (rarely)  Reproductive/Obstetrics  Anesthesia Physical Anesthesia Plan  ASA: 3  Anesthesia Plan: General   Post-op Pain Management:    Induction: Intravenous  PONV  Risk Score and Plan: 2 and Ondansetron, Dexamethasone and Treatment may vary due to age or medical condition  Airway Management Planned: Oral ETT  Additional Equipment:   Intra-op Plan:   Post-operative Plan: Extubation in OR  Informed Consent: I have reviewed the patients History and Physical, chart, labs and discussed the procedure including the risks, benefits and alternatives for the proposed anesthesia with the patient or authorized representative who has indicated his/her understanding and acceptance.     Dental advisory given  Plan Discussed with: CRNA  Anesthesia Plan Comments: (Patient consented for risks of anesthesia including but not limited to:  - adverse reactions to medications - damage to eyes, teeth, lips or other oral mucosa - nerve damage due to positioning  - sore throat or hoarseness - damage to heart, brain, nerves, lungs, other parts of body or loss of life  Informed patient about role of CRNA in peri- and intra-operative care.  Patient voiced understanding.)        Anesthesia Quick Evaluation

## 2023-08-13 NOTE — Anesthesia Postprocedure Evaluation (Signed)
Anesthesia Post Note  Patient: Collin Knight.  Procedure(s) Performed: CYSTOSCOPY WITH BLADDER BIOPSY TRANSURETHRAL RESECTION OF BLADDER TUMOR (TURBT) CYSTOSCOPY WITH RETROGRADE PYELOGRAM/URETERAL STENT PLACEMENT (Right)  Patient location during evaluation: PACU Anesthesia Type: General Level of consciousness: awake and alert, oriented and patient cooperative Pain management: pain level controlled Vital Signs Assessment: post-procedure vital signs reviewed and stable Respiratory status: spontaneous breathing, nonlabored ventilation and respiratory function stable Cardiovascular status: blood pressure returned to baseline and stable Postop Assessment: adequate PO intake Anesthetic complications: no   No notable events documented.   Last Vitals:  Vitals:   08/13/23 1245 08/13/23 1259  BP: (!) 152/99 (!) 151/97  Pulse: (!) 59 62  Resp: 17 16  Temp: 37 C (!) 36.1 C  SpO2: 98% 96%    Last Pain:  Vitals:   08/13/23 1259  TempSrc: Temporal  PainSc: 0-No pain                 Reed Breech

## 2023-08-13 NOTE — Transfer of Care (Signed)
Immediate Anesthesia Transfer of Care Note  Patient: [facility].  Procedure(s) Performed: CYSTOSCOPY WITH BLADDER BIOPSY TRANSURETHRAL RESECTION OF BLADDER TUMOR (TURBT) CYSTOSCOPY WITH RETROGRADE PYELOGRAM/URETERAL STENT PLACEMENT (Right)  Patient Location: PACU  Anesthesia Type:General  Level of Consciousness: awake, drowsy, and patient cooperative  Airway & Oxygen Therapy: Patient Spontanous Breathing and Patient connected to face mask oxygen  Post-op Assessment: Report given to RN and Post -op Vital signs reviewed and stable  Post vital signs: Reviewed and stable  Last Vitals:  Vitals Value Taken Time  BP 147/95 08/13/23 1222  Temp 37.2 C 08/13/23 1222  Pulse 65 08/13/23 1223  Resp 15 08/13/23 1223  SpO2 100 % 08/13/23 1223  Vitals shown include unfiled device data.  Last Pain:  Vitals:   08/13/23 1047  TempSrc: Oral         Complications: No notable events documented.

## 2023-08-13 NOTE — Interval H&P Note (Signed)
History and Physical Interval Note:  08/13/2023 10:49 AM  CV:RRR Lungs:clear  [facility] Montez Hageman.  has presented today for surgery, with the diagnosis of Bladder Cancer.  The various methods of treatment have been discussed with the patient and family. After consideration of risks, benefits and other options for treatment, the patient has consented to  Procedure(s): CYSTOSCOPY WITH BLADDER BIOPSY (N/A) TRANSURETHRAL RESECTION OF BLADDER TUMOR (TURBT) (N/A) as a surgical intervention.  The patient's history has been reviewed, patient examined, no change in status, stable for surgery.  I have reviewed the patient's chart and labs.  Questions were answered to the patient's satisfaction.     Daje Stark C Loriel Diehl

## 2023-08-13 NOTE — Discharge Instructions (Addendum)
Transurethral Resection of Bladder Tumor (TURBT) or Bladder Biopsy   Definition:  Transurethral Resection of the Bladder Tumor is a surgical procedure used to diagnose and remove tumors within the bladder. T  General instructions:     Your recent bladder surgery requires very little post hospital care but some definite precautions.  Despite the fact that no skin incisions were used, the area around the bladder incisions are raw and covered with scabs to promote healing and prevent bleeding. Certain precautions are needed to insure that the scabs are not disturbed over the next 2-4 weeks while the healing proceeds.  Because the raw surface inside your bladder and the irritating effects of urine you may expect frequency of urination and/or urgency (a stronger desire to urinate) and perhaps even getting up at night more often. This will usually resolve or improve slowly over the healing period. You may see some blood in your urine over the first 6 weeks. Do not be alarmed, even if the urine was clear for a while. Get off your feet and drink lots of fluids until clearing occurs. If you start to pass clots or don't improve call us.  You did have tumor just inside the right ureter which was resected.  A ureteral stent was placed to prevent obstruction of the ureter from swelling and the stent will allow your planned better treatments to get into this area also.  Diet:  You may return to your normal diet immediately. Because of the raw surface of your bladder, alcohol, spicy foods, foods high in acid and drinks with caffeine may cause irritation or frequency and should be used in moderation. To keep your urine flowing freely and avoid constipation, drink plenty of fluids during the day (8-10 glasses). Tip: Avoid cranberry juice because it is very acidic.  Activity:  Your physical activity doesn't need to be restricted. However, if you are very active, you may see some blood in the urine. We suggest  that you reduce your activity under the circumstances until the bleeding has stopped.  Bowels:  It is important to keep your bowels regular during the postoperative period. Straining with bowel movements can cause bleeding. A bowel movement every other day is reasonable. Use a mild laxative if needed, such as milk of magnesia 2-3 tablespoons, or 2 Dulcolax tablets. Call if you continue to have problems. If you had been taking narcotics for pain, before, during or after your surgery, you may be constipated. Take a laxative if necessary.    Medication:  You should resume your pre-surgery medications unless told not to. In addition you may be given an antibiotic to prevent or treat infection. Antibiotics are not always necessary. All medication should be taken as prescribed until the bottles are finished unless you are having an unusual reaction to one of the drugs.   [facility] [address] [place], [place] [address] [phone]   AMBULATORY SURGERY  DISCHARGE INSTRUCTIONS   The drugs that you were given will stay in your system until tomorrow so for the next 24 hours you should not:  Drive an automobile Make any legal decisions Drink any alcoholic beverage   You may resume regular meals tomorrow.  Today it is better to start with liquids and gradually work up to solid foods.  You may eat anything you prefer, but it is better to start with liquids, then soup and crackers, and gradually work up to solid foods.   Please notify your doctor immediately if  you have any unusual bleeding, trouble breathing, redness and pain at the surgery site, drainage, fever, or pain not relieved by medication.    Additional Instructions:

## 2023-08-13 NOTE — Anesthesia Procedure Notes (Signed)
Procedure Name: Intubation Date/Time: 08/13/2023 11:04 AM  Performed by: Mohammed Kindle, CRNAPre-anesthesia Checklist: Patient identified, Emergency Drugs available, Suction available and Patient being monitored Patient Re-evaluated:Patient Re-evaluated prior to induction Oxygen Delivery Method: Circle system utilized Preoxygenation: Pre-oxygenation with 100% oxygen Induction Type: IV induction Ventilation: Mask ventilation without difficulty Laryngoscope Size: McGraph and 3 Grade View: Grade I Tube type: Oral Tube size: 7.0 mm Number of attempts: 1 Airway Equipment and Method: Stylet Placement Confirmation: ETT inserted through vocal cords under direct vision, positive ETCO2, breath sounds checked- equal and bilateral and CO2 detector Secured at: 21 cm Tube secured with: Tape Dental Injury: Teeth and Oropharynx as per pre-operative assessment

## 2023-08-14 ENCOUNTER — Encounter: Payer: Self-pay | Admitting: Urology

## 2023-08-15 LAB — SURGICAL PATHOLOGY

## 2023-08-30 ENCOUNTER — Telehealth: Payer: Self-pay | Admitting: Urology

## 2023-08-30 NOTE — Telephone Encounter (Signed)
Pt had surgery 10/22 and said when he takes his  Eliquis, he has hematuria.  He wants to [facility] f this is normal.

## 2023-08-30 NOTE — Telephone Encounter (Signed)
Left message on voice mail. Per patient when he walked in .

## 2023-08-30 NOTE — Telephone Encounter (Signed)
Hematuria most likely stent related.  Small amount of blood in the urine are normal and he can continue the Eliquis.  If he has heavier bleeding with clots would have him hold Eliquis for 1-2 days.

## 2023-09-04 ENCOUNTER — Ambulatory Visit: Payer: Medicare Other

## 2023-09-04 DIAGNOSIS — I495 Sick sinus syndrome: Secondary | ICD-10-CM | POA: Diagnosis not present

## 2023-09-05 LAB — CUP PACEART REMOTE DEVICE CHECK
Battery Remaining Longevity: 159 mo
Battery Voltage: 3.15 V
Brady Statistic AP VP Percent: 0.04 %
Brady Statistic AP VS Percent: 98.28 %
Brady Statistic AS VP Percent: 0 %
Brady Statistic AS VS Percent: 1.7 %
Brady Statistic RA Percent Paced: 98.22 %
Brady Statistic RV Percent Paced: 0.04 %
Date Time Interrogation Session: 20241113141148
Implantable Lead Connection Status: 753985
Implantable Lead Connection Status: 753985
Implantable Lead Implant Date: 20240430
Implantable Lead Implant Date: 20240430
Implantable Lead Location: 753859
Implantable Lead Location: 753860
Implantable Lead Model: 3830
Implantable Lead Model: 5076
Implantable Pulse Generator Implant Date: 20240430
Lead Channel Impedance Value: 323 Ohm
Lead Channel Impedance Value: 323 Ohm
Lead Channel Impedance Value: 475 Ohm
Lead Channel Impedance Value: 513 Ohm
Lead Channel Pacing Threshold Amplitude: 0.75 V
Lead Channel Pacing Threshold Amplitude: 1.125 V
Lead Channel Pacing Threshold Pulse Width: 0.4 ms
Lead Channel Pacing Threshold Pulse Width: 0.4 ms
Lead Channel Sensing Intrinsic Amplitude: 0.5 mV
Lead Channel Sensing Intrinsic Amplitude: 0.5 mV
Lead Channel Sensing Intrinsic Amplitude: 25.75 mV
Lead Channel Sensing Intrinsic Amplitude: 25.75 mV
Lead Channel Setting Pacing Amplitude: 1.5 V
Lead Channel Setting Pacing Amplitude: 2.25 V
Lead Channel Setting Pacing Pulse Width: 0.4 ms
Lead Channel Setting Sensing Sensitivity: 0.9 mV
Zone Setting Status: 755011

## 2023-09-16 NOTE — Progress Notes (Unsigned)
BCG Bladder Instillation  BCG # 1/6  Due to Bladder Cancer patient is present today for a BCG treatment. Patient was cleaned and prepped in a sterile fashion with betadine. A 14 FR catheter was inserted, urine return was noted 100 ml, urine was yellow in color.  50ml of reconstituted BCG was instilled into the bladder. The catheter was then removed. Patient tolerated well, no complications were noted  Performed by: Michiel Cowboy, PA-C and Humberta Magallon-Mariche, CMA   Follow up/ Additional notes: Next week for his number 2 out of 6 BCG

## 2023-09-18 ENCOUNTER — Encounter: Payer: Self-pay | Admitting: Urology

## 2023-09-18 ENCOUNTER — Ambulatory Visit: Payer: Medicare Other | Admitting: Urology

## 2023-09-18 VITALS — BP 143/90 | HR 88

## 2023-09-18 DIAGNOSIS — C679 Malignant neoplasm of bladder, unspecified: Secondary | ICD-10-CM

## 2023-09-18 LAB — URINALYSIS, COMPLETE
Bilirubin, UA: NEGATIVE
Ketones, UA: NEGATIVE
Nitrite, UA: NEGATIVE
Protein,UA: NEGATIVE
Specific Gravity, UA: <1.005 — ABNORMAL LOW (ref 1.005–1.030)
Urobilinogen, Ur: 0.2 mg/dL (ref 0.2–1.0)
pH, UA: 6 (ref 5.0–7.5)

## 2023-09-18 LAB — MICROSCOPIC EXAMINATION: RBC, Urine: >30 /[HPF] — AB (ref 0–2)

## 2023-09-18 MED ORDER — BCG LIVE 50 MG IS SUSR
3.2400 mL | Freq: Once | INTRAVESICAL | Status: AC
  Administered 2023-09-18: 81 mg via INTRAVESICAL

## 2023-09-18 NOTE — Patient Instructions (Signed)

## 2023-09-23 NOTE — Progress Notes (Unsigned)
BCG Bladder Instillation  BCG # 2/6  Due to Bladder Cancer patient is present today for a BCG treatment. Patient was cleaned and prepped in a sterile fashion with betadine. A 14 FR catheter was inserted, urine return was noted 150 ml, urine was yellow clear in color.  50ml of reconstituted BCG was instilled into the bladder. The catheter was then removed. Patient tolerated well, no complications were noted  Performed by: Michiel Cowboy, PA-C and Darrol Angel, CMA   Follow up/ Additional notes: 1 week for number 3 out of 6 BCG

## 2023-09-25 ENCOUNTER — Encounter: Payer: Self-pay | Admitting: Urology

## 2023-09-25 ENCOUNTER — Ambulatory Visit (INDEPENDENT_AMBULATORY_CARE_PROVIDER_SITE_OTHER): Payer: Medicare Other | Admitting: Urology

## 2023-09-25 ENCOUNTER — Ambulatory Visit: Payer: Medicare Other | Admitting: Urology

## 2023-09-25 VITALS — BP 120/77 | HR 79 | Ht 70.5 in | Wt 150.0 lb

## 2023-09-25 DIAGNOSIS — C679 Malignant neoplasm of bladder, unspecified: Secondary | ICD-10-CM | POA: Diagnosis not present

## 2023-09-25 LAB — MICROSCOPIC EXAMINATION

## 2023-09-25 LAB — URINALYSIS, COMPLETE
Bilirubin, UA: NEGATIVE
Ketones, UA: NEGATIVE
Nitrite, UA: NEGATIVE
Protein,UA: NEGATIVE
Specific Gravity, UA: <1.005 — ABNORMAL LOW (ref 1.005–1.030)
Urobilinogen, Ur: 0.2 mg/dL (ref 0.2–1.0)
pH, UA: 6.5 (ref 5.0–7.5)

## 2023-09-25 MED ORDER — BCG LIVE 50 MG IS SUSR
3.2400 mL | Freq: Once | INTRAVESICAL | Status: AC
  Administered 2023-09-25: 81 mg via INTRAVESICAL

## 2023-09-26 NOTE — Progress Notes (Signed)
Remote pacemaker transmission.   

## 2023-09-29 ENCOUNTER — Encounter: Payer: Self-pay | Admitting: Emergency Medicine

## 2023-09-29 ENCOUNTER — Emergency Department: Payer: Medicare Other

## 2023-09-29 DIAGNOSIS — R0789 Other chest pain: Secondary | ICD-10-CM | POA: Insufficient documentation

## 2023-09-29 DIAGNOSIS — R079 Chest pain, unspecified: Secondary | ICD-10-CM | POA: Diagnosis present

## 2023-09-29 DIAGNOSIS — R0602 Shortness of breath: Secondary | ICD-10-CM | POA: Diagnosis not present

## 2023-09-29 LAB — BASIC METABOLIC PANEL
Anion gap: 11 (ref 5–15)
BUN: 19 mg/dL (ref 8–23)
CO2: 23 mmol/L (ref 22–32)
Calcium: 8.5 mg/dL — ABNORMAL LOW (ref 8.9–10.3)
Chloride: 101 mmol/L (ref 98–111)
Creatinine, Ser: 1.19 mg/dL (ref 0.61–1.24)
GFR, Estimated: >60 mL/min (ref >60)
Glucose, Bld: 107 mg/dL — ABNORMAL HIGH (ref 70–99)
Potassium: 3.2 mmol/L — ABNORMAL LOW (ref 3.5–5.1)
Sodium: 135 mmol/L (ref 135–145)

## 2023-09-29 LAB — CBC
HCT: 43.2 % (ref 39.0–52.0)
Hemoglobin: 14.7 g/dL (ref 13.0–17.0)
MCH: 35.3 pg — ABNORMAL HIGH (ref 26.0–34.0)
MCHC: 34 g/dL (ref 30.0–36.0)
MCV: 103.6 fL — ABNORMAL HIGH (ref 80.0–100.0)
Platelets: 276 10*3/uL (ref 150–400)
RBC: 4.17 MIL/uL — ABNORMAL LOW (ref 4.22–5.81)
RDW: 13.6 % (ref 11.5–15.5)
WBC: 7.4 10*3/uL (ref 4.0–10.5)
nRBC: 0 % (ref 0.0–0.2)

## 2023-09-29 LAB — BRAIN NATRIURETIC PEPTIDE: B Natriuretic Peptide: 33.9 pg/mL (ref 0.0–100.0)

## 2023-09-29 LAB — TROPONIN I (HIGH SENSITIVITY): Troponin I (High Sensitivity): 7 ng/L (ref <18)

## 2023-09-29 NOTE — ED Triage Notes (Signed)
Pt here form home with c/o chest pian and sob along with some know anxiety, pt took his lasix and valium but did not help

## 2023-09-30 ENCOUNTER — Emergency Department
Admission: EM | Admit: 2023-09-30 | Discharge: 2023-09-30 | Disposition: A | Discharge status: Home / Self Care | Payer: Medicare Other | Attending: Emergency Medicine | Admitting: Emergency Medicine

## 2023-09-30 DIAGNOSIS — R0789 Other chest pain: Secondary | ICD-10-CM | POA: Diagnosis not present

## 2023-09-30 MED ORDER — POTASSIUM CHLORIDE CRYS ER 20 MEQ PO TBCR
40.0000 meq | EXTENDED_RELEASE_TABLET | Freq: Once | ORAL | Status: AC
  Administered 2023-09-30: 40 meq via ORAL
  Filled 2023-09-30: qty 2

## 2023-09-30 NOTE — Progress Notes (Unsigned)
BCG Bladder Instillation  BCG # 3/6  Due to Bladder Cancer patient is present today for a BCG treatment. Patient was cleaned and prepped in a sterile fashion with betadine. A 14 FR catheter was inserted, urine return was noted 100 ml, urine was yellow in color.  50ml of reconstituted BCG was instilled into the bladder. The catheter was then removed. Patient tolerated well, no complications were noted  Performed by: Michiel Cowboy, PA-C and Annabell Sabal, CMA   Follow up/ Additional notes: Follow-up in 1 week for number 4 out of 6 BCG

## 2023-09-30 NOTE — Discharge Instructions (Addendum)
Your workup in the Emergency Department today was reassuring.  We did not find any specific abnormalities other than your potassium level being a bit low.  We recommend you drink plenty of fluids, take your regular medications and/or any new ones prescribed today, and follow up with the doctor(s) listed in these documents as recommended.  Return to the Emergency Department if you develop new or worsening symptoms that concern you.

## 2023-09-30 NOTE — ED Provider Notes (Signed)
[facility] Provider Note    Event Date/Time   First MD Initiated Contact with Patient 09/30/23 0106     (approximate)   History   Chest Pain   HPI [facility] Montez Hageman. is a 77 y.o. male who presents for some discomfort in his chest.  He said that it started more than 24 hours ago and feels more like "chest congestion", like when he had too much fluid on his lungs in the past.  However the symptoms got better over the course of the day yesterday.  This morning when he woke up he felt the same thing again.  It is not really chest pain, just some discomfort or feeling of fullness.  He said he is very prone to anxiety and he got better yesterday after he took some Valium and some furosemide.  He tried the same thing today and it did not really change the way he felt so he wanted to make sure everything was okay.  Locally he sees Dr. Darrold Junker with cardiology and Dr. Burnett Sheng for his PCP.     Physical Exam   Triage Vital Signs: ED Triage Vitals  Encounter Vitals Group     BP 09/29/23 2055 (!) 130/117     Systolic BP Percentile --      Diastolic BP Percentile --      Pulse Rate 09/29/23 2055 75     Resp 09/29/23 2055 18     Temp 09/29/23 2055 97.6 F (36.4 C)     Temp src --      SpO2 09/29/23 2055 96 %     Weight 09/29/23 2057 68 kg (150 lb)     Height 09/29/23 2057 1.791 m (5' 10.5")     Head Circumference --      Peak Flow --      Pain Score 09/29/23 2056 4     Pain Loc --      Pain Education --      Exclude from Growth Chart --     Most recent vital signs: Vitals:   09/30/23 0230 09/30/23 0300  BP: (!) 142/90 (!) 132/90  Pulse: 61 64  Resp:  16  Temp:    SpO2: 97% 96%    General: Awake, no distress.  Generally well-appearing despite his chronic medical issues. CV:  Good peripheral perfusion.  Normal heart sounds, regular rate and rhythm. Resp:  Normal effort. Speaking easily and comfortably, no accessory muscle usage nor intercostal  retractions.  Lung clear to auscultation bilaterally. Abd:  No distention.  No tenderness to palpation of the abdomen.   ED Results / Procedures / Treatments   Labs (all labs ordered are listed, but only abnormal results are displayed) Labs Reviewed  BASIC METABOLIC PANEL - Abnormal; Notable for the following components:      Result Value   Potassium 3.2 (*)    Glucose, Bld 107 (*)    Calcium 8.5 (*)    All other components within normal limits  CBC - Abnormal; Notable for the following components:   RBC 4.17 (*)    MCV 103.6 (*)    MCH 35.3 (*)    All other components within normal limits  BRAIN NATRIURETIC PEPTIDE  TROPONIN I (HIGH SENSITIVITY)     EKG  ED ECG REPORT I, Loleta Rose, the attending physician, personally viewed and interpreted this ECG.  Date: 09/29/2023 EKG Time: 21:08 Rate: 66 Rhythm: atrial-paced with occasional PVC QRS Axis: LAD Intervals: normal ST/T  Wave abnormalities: Non-specific ST segment / T-wave changes, but no clear evidence of acute ischemia. Narrative Interpretation: no definitive evidence of acute ischemia; does not meet STEMI criteria.    RADIOLOGY I viewed and interpreted the patient's two-view chest x-ray.  No evidence of pneumonia or pneumothorax.  I also read the radiologist's report, which confirmed no acute findings.   PROCEDURES:  Critical Care performed: No  .1-3 Lead EKG Interpretation  Performed by: Loleta Rose, MD Authorized by: Loleta Rose, MD     Interpretation: normal     ECG rate:  65   ECG rate assessment: normal     Rhythm: sinus rhythm     Ectopy: none     Conduction: normal       IMPRESSION / MDM / ASSESSMENT AND PLAN / ED COURSE  I reviewed the triage vital signs and the nursing notes.                              Differential diagnosis includes, but is not limited to, anxiety, fluid overload, ACS, PE, pneumonia.  Patient's presentation is most consistent with acute presentation with  potential threat to life or bodily function.  Labs/studies ordered: BMP, BNP, high-sensitivity troponin, CBC, two-view chest x-ray, EKG  Interventions/Medications given:  Medications  potassium chloride SA (KLOR-CON M) CR tablet 40 mEq (has no administration in time range)    (Note:  hospital course my include additional interventions and/or labs/studies not listed above.)   Patient's vital signs have been stable and he has been well-appearing and generally asymptomatic for nearly 7 hours in the emergency department.  Symptoms started possibly yesterday morning, arguably at the latest this morning, so 1 high-sensitivity troponin within normal limits is sufficient to rule out ACS.  No evidence of ischemia on EKG.  Chest x-ray is clear and labs are all essentially normal other than mild hypokalemia for which ordered 40 mill equivalents p.o.  I provided reassurance and explained that there is no evidence he is having an acute or emergent medical condition requiring hospitalization.  He should be appropriate for outpatient follow-up with his PCP and/or with cardiology.  He and his wife understand and agree with the plan and he will continue taking his regular medications.  I gave strict return precautions.  I considered hospitalization given his complicated medical history, but at this point, given no evidence of an emergent condition that can be treated in the hospital, I believe that outpatient follow-up is appropriate.  The patient is on the cardiac monitor to evaluate for evidence of arrhythmia and/or significant heart rate changes.       FINAL CLINICAL IMPRESSION(S) / ED DIAGNOSES   Final diagnoses:  Atypical chest pain     Rx / DC Orders   ED Discharge Orders          Ordered    Ambulatory referral to Cardiology       Comments: If you have not heard from the Cardiology office within the next 72 hours please call [phone].   09/30/23 0321             Note:  This  document was prepared using Dragon voice recognition software and may include unintentional dictation errors.   Loleta Rose, MD 09/30/23 [phone]

## 2023-10-02 ENCOUNTER — Encounter: Payer: Self-pay | Admitting: Urology

## 2023-10-02 ENCOUNTER — Ambulatory Visit (INDEPENDENT_AMBULATORY_CARE_PROVIDER_SITE_OTHER): Payer: Medicare Other | Admitting: Urology

## 2023-10-02 VITALS — BP 119/83 | HR 76 | Ht 70.5 in | Wt 150.0 lb

## 2023-10-02 DIAGNOSIS — C679 Malignant neoplasm of bladder, unspecified: Secondary | ICD-10-CM | POA: Diagnosis not present

## 2023-10-02 LAB — URINALYSIS, COMPLETE
Bilirubin, UA: NEGATIVE
Ketones, UA: NEGATIVE
Nitrite, UA: NEGATIVE
Specific Gravity, UA: 1.01 (ref 1.005–1.030)
Urobilinogen, Ur: 0.2 mg/dL (ref 0.2–1.0)
pH, UA: 6.5 (ref 5.0–7.5)

## 2023-10-02 LAB — MICROSCOPIC EXAMINATION: RBC, Urine: >30 /[HPF] — AB (ref 0–2)

## 2023-10-02 MED ORDER — BCG LIVE 50 MG IS SUSR
3.2400 mL | Freq: Once | INTRAVESICAL | Status: AC
  Administered 2023-10-02: 81 mg via INTRAVESICAL

## 2023-10-03 ENCOUNTER — Telehealth: Payer: Self-pay

## 2023-10-03 NOTE — Telephone Encounter (Signed)
Patient Advocate Encounter  Application for Eliquis faxed to [employer] on 10/02/2023. Application form attached to patient chart.  Burnell Blanks, CPhT Rx Patient Advocate Phone: [phone]

## 2023-10-07 NOTE — Progress Notes (Unsigned)
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 Patient was cleaned and prepped in a sterile fashion with betadine. A 14 FR catheter was inserted, urine return was noted 200 ml, urine was yellow in color.  50ml of reconstituted BCG was instilled into the bladder. The catheter was then removed. Patient tolerated well, no complications were noted  Performed by: Michiel Cowboy, PA-C and Annabell Sabal, CMA   Follow up/ Additional notes: One week for #5/6 BCG

## 2023-10-09 ENCOUNTER — Encounter: Payer: Self-pay | Admitting: Urology

## 2023-10-09 ENCOUNTER — Ambulatory Visit (INDEPENDENT_AMBULATORY_CARE_PROVIDER_SITE_OTHER): Payer: Medicare Other | Admitting: Urology

## 2023-10-09 VITALS — BP 111/72 | HR 83 | Ht 70.5 in | Wt 150.0 lb

## 2023-10-09 DIAGNOSIS — C679 Malignant neoplasm of bladder, unspecified: Secondary | ICD-10-CM | POA: Diagnosis not present

## 2023-10-09 DIAGNOSIS — D494 Neoplasm of unspecified behavior of bladder: Secondary | ICD-10-CM

## 2023-10-09 LAB — MICROSCOPIC EXAMINATION: RBC, Urine: >30 /[HPF] — AB (ref 0–2)

## 2023-10-09 LAB — URINALYSIS, COMPLETE
Bilirubin, UA: NEGATIVE
Ketones, UA: NEGATIVE
Nitrite, UA: NEGATIVE
Specific Gravity, UA: 1.015 (ref 1.005–1.030)
Urobilinogen, Ur: 0.2 mg/dL (ref 0.2–1.0)
pH, UA: 6 (ref 5.0–7.5)

## 2023-10-09 MED ORDER — BCG LIVE 50 MG IS SUSR
3.2400 mL | Freq: Once | INTRAVESICAL | Status: AC
  Administered 2023-10-09: 81 mg via INTRAVESICAL

## 2023-10-14 NOTE — Telephone Encounter (Signed)
Contacted [employer] to check on status of application; representative stated that application has been received but is still being processed. Will continue to follow for updates.

## 2023-10-24 ENCOUNTER — Ambulatory Visit: Payer: Medicare Other | Admitting: Physician Assistant

## 2023-10-24 DIAGNOSIS — D494 Neoplasm of unspecified behavior of bladder: Secondary | ICD-10-CM

## 2023-10-24 DIAGNOSIS — C679 Malignant neoplasm of bladder, unspecified: Secondary | ICD-10-CM | POA: Diagnosis not present

## 2023-10-24 LAB — URINALYSIS, COMPLETE
Bilirubin, UA: NEGATIVE
Ketones, UA: NEGATIVE
Nitrite, UA: NEGATIVE
Protein,UA: NEGATIVE
Specific Gravity, UA: 1.01 (ref 1.005–1.030)
Urobilinogen, Ur: 0.2 mg/dL (ref 0.2–1.0)
pH, UA: 6.5 (ref 5.0–7.5)

## 2023-10-24 LAB — MICROSCOPIC EXAMINATION: Bacteria, UA: NONE SEEN

## 2023-10-24 MED ORDER — BCG LIVE 50 MG IS SUSR
3.2400 mL | Freq: Once | INTRAVESICAL | Status: AC
  Administered 2023-10-24: 81 mg via INTRAVESICAL

## 2023-10-24 NOTE — Progress Notes (Signed)
 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 Patient was cleaned and prepped in a sterile fashion with betadine . A 14FR catheter was inserted, urine return was noted , urine was yellow in color.  50ml of reconstituted BCG was instilled into the bladder. The catheter was then removed. Patient tolerated well, no complications were noted  Performed by: Savoy Somerville, PA-C and Humberta Magallon-Mariche, CMA  Follow up/ Additional notes: 1 week for BCG #6 of 6

## 2023-10-25 ENCOUNTER — Other Ambulatory Visit: Payer: Self-pay

## 2023-10-25 MED ORDER — DAPAGLIFLOZIN PROPANEDIOL 10 MG PO TABS: 10.0000 mg | ORAL_TABLET | Freq: Every day | ORAL | 3 refills | Status: AC

## 2023-10-25 NOTE — Progress Notes (Signed)
 Collin Knight Rx sent to [employer] for [place] Patient [place] Program

## 2023-10-31 ENCOUNTER — Ambulatory Visit (INDEPENDENT_AMBULATORY_CARE_PROVIDER_SITE_OTHER): Payer: Medicare Other | Admitting: Physician Assistant

## 2023-10-31 VITALS — BP 153/99 | HR 85

## 2023-10-31 DIAGNOSIS — C679 Malignant neoplasm of bladder, unspecified: Secondary | ICD-10-CM | POA: Diagnosis not present

## 2023-10-31 LAB — MICROSCOPIC EXAMINATION: RBC, Urine: >30 /[HPF] — AB (ref 0–2)

## 2023-10-31 LAB — URINALYSIS, COMPLETE
Bilirubin, UA: NEGATIVE
Ketones, UA: NEGATIVE
Nitrite, UA: NEGATIVE
Protein,UA: NEGATIVE
Specific Gravity, UA: 1.01 (ref 1.005–1.030)
Urobilinogen, Ur: 0.2 mg/dL (ref 0.2–1.0)
pH, UA: 6.5 (ref 5.0–7.5)

## 2023-10-31 MED ORDER — BCG LIVE 50 MG IS SUSR
3.2400 mL | Freq: Once | INTRAVESICAL | Status: AC
  Administered 2023-10-31: 81 mg via INTRAVESICAL

## 2023-10-31 NOTE — Progress Notes (Signed)
 BCG Bladder Instillation  BCG # 6 of 6  Due to Bladder Cancer patient is present today for a BCG treatment. Patient was cleaned and prepped in a sterile fashion with betadine . A 14FR catheter was inserted, urine return was noted , urine was yellow in color.  50ml of reconstituted BCG was instilled into the bladder. The catheter was then removed. Patient tolerated well, no complications were noted  Performed by: Nimco Bivens, PA-C and Anastasiya Hopkins, CMA  Follow up/ Additional notes: Return for Will call to schedule stent removal.

## 2023-11-07 ENCOUNTER — Encounter: Payer: Medicare Other | Admitting: Internal Medicine

## 2023-11-11 NOTE — Telephone Encounter (Signed)
Attempt to contact [employer] for status update. Office is currently closed for the holiday. Will try again

## 2023-11-14 NOTE — Telephone Encounter (Signed)
Contacted [employer] for status update. Representative stated that application was denied on 11/07/23 due to 3% out of pocket not being met. Pt will need to spend $996.06 on medication in 2025 before meeting approval criteria.

## 2023-11-28 ENCOUNTER — Other Ambulatory Visit: Payer: Medicare Other | Admitting: Urology

## 2023-12-04 ENCOUNTER — Ambulatory Visit (INDEPENDENT_AMBULATORY_CARE_PROVIDER_SITE_OTHER): Payer: Medicare Other

## 2023-12-04 DIAGNOSIS — I495 Sick sinus syndrome: Secondary | ICD-10-CM

## 2023-12-05 LAB — CUP PACEART REMOTE DEVICE CHECK
Battery Remaining Longevity: 157 mo
Battery Voltage: 3.12 V
Brady Statistic AP VP Percent: 0.04 %
Brady Statistic AP VS Percent: 98.11 %
Brady Statistic AS VP Percent: 0 %
Brady Statistic AS VS Percent: 1.86 %
Brady Statistic RA Percent Paced: 98.35 %
Brady Statistic RV Percent Paced: 0.04 %
Date Time Interrogation Session: 20250211233355
Implantable Lead Connection Status: 753985
Implantable Lead Connection Status: 753985
Implantable Lead Implant Date: 20240430
Implantable Lead Implant Date: 20240430
Implantable Lead Location: 753859
Implantable Lead Location: 753860
Implantable Lead Model: 3830
Implantable Lead Model: 5076
Implantable Pulse Generator Implant Date: 20240430
Lead Channel Impedance Value: 342 Ohm
Lead Channel Impedance Value: 380 Ohm
Lead Channel Impedance Value: 494 Ohm
Lead Channel Impedance Value: 551 Ohm
Lead Channel Pacing Threshold Amplitude: 0.625 V
Lead Channel Pacing Threshold Amplitude: 1.125 V
Lead Channel Pacing Threshold Pulse Width: 0.4 ms
Lead Channel Pacing Threshold Pulse Width: 0.4 ms
Lead Channel Sensing Intrinsic Amplitude: 0.625 mV
Lead Channel Sensing Intrinsic Amplitude: 0.625 mV
Lead Channel Sensing Intrinsic Amplitude: 26.125 mV
Lead Channel Sensing Intrinsic Amplitude: 26.125 mV
Lead Channel Setting Pacing Amplitude: 1.5 V
Lead Channel Setting Pacing Amplitude: 2.25 V
Lead Channel Setting Pacing Pulse Width: 0.4 ms
Lead Channel Setting Sensing Sensitivity: 0.9 mV
Zone Setting Status: 755011

## 2023-12-07 ENCOUNTER — Encounter: Payer: Self-pay | Admitting: Cardiology

## 2023-12-25 ENCOUNTER — Ambulatory Visit (INDEPENDENT_AMBULATORY_CARE_PROVIDER_SITE_OTHER): Payer: Medicare Other | Admitting: Urology

## 2023-12-25 VITALS — BP 137/87 | HR 82 | Ht 70.0 in | Wt 151.0 lb

## 2023-12-25 DIAGNOSIS — D494 Neoplasm of unspecified behavior of bladder: Secondary | ICD-10-CM

## 2023-12-25 DIAGNOSIS — C679 Malignant neoplasm of bladder, unspecified: Secondary | ICD-10-CM | POA: Diagnosis not present

## 2023-12-25 DIAGNOSIS — C669 Malignant neoplasm of unspecified ureter: Secondary | ICD-10-CM

## 2023-12-25 LAB — MICROSCOPIC EXAMINATION: Bacteria, UA: NONE SEEN

## 2023-12-25 LAB — URINALYSIS, COMPLETE
Bilirubin, UA: NEGATIVE
Ketones, UA: NEGATIVE
Leukocytes,UA: NEGATIVE
Nitrite, UA: NEGATIVE
Protein,UA: NEGATIVE
Specific Gravity, UA: <1.005 — ABNORMAL LOW (ref 1.005–1.030)
Urobilinogen, Ur: 0.2 mg/dL (ref 0.2–1.0)
pH, UA: 6 (ref 5.0–7.5)

## 2023-12-25 MED ORDER — SULFAMETHOXAZOLE-TRIMETHOPRIM 800-160 MG PO TABS
1.0000 | ORAL_TABLET | Freq: Once | ORAL | Status: AC
  Administered 2023-12-25: 1 via ORAL

## 2023-12-26 NOTE — Progress Notes (Signed)
   12/26/23  CC:  Chief Complaint  Patient presents with   Cysto   Urologic history: 1.  Urothelial carcinoma bladder Initially seen 05/29/2023 for intermittent gross hematuria x 2-3 years CTU showed a 4.7 x 3.3 enhancing posterior wall bladder mass Cystoscopy under anesthesia 07/09/2023 with 6 cm papillary tumor; s/p TURBT Pathology high-grade T1 urothelial carcinoma Second look/restaging TURBT 08/13/2023.  Papillary tumor surrounding the right ureteral orifice which was resected.  Right ureteroscopy performed which did show tumor extending into the right distal ureter for a length of ~ 5 mm which was resected Ureteral stent was placed Completed induction BCG 10/31/2023  HPI: Collin Knight presents for post BCG cystoscopy and stent removal.  He has no complaints today.  UA 3-10 RBC  Blood pressure 137/87, pulse 82, height 5\' 10"  (1.778 m), weight 151 lb (68.5 kg).   Cystoscopy Procedure Note  Patient identification was confirmed, informed consent was obtained, and patient was prepped using Betadine solution.  Lidocaine jelly was administered per urethral meatus.     Pre-Procedure: - Inspection reveals a normal caliber urethral meatus.  Procedure: The flexible cystoscope was introduced without difficulty - No urethral strictures/lesions are present. -  Prominent lateral lobe enlargement  prostate  -Moderate elevation bladder neck - Bilateral ureteral orifices identified - Bladder mucosa  reveals no ulcers, tumors, or lesions - No bladder stones -Moderate trabeculation  Retroflexion shows no tumor  At completion of cystoscopy a grasping forceps was placed cystoscope and the right ureteral stent was removed.   Post-Procedure: - Patient tolerated the procedure well  Assessment/ Plan: No evidence recurrent tumor Maintenance BCG times 23 January 2024 Follow-up surveillance cystoscopy and right ureteroscopy under anesthesia June 2025    Collin Altes, MD

## 2024-01-02 ENCOUNTER — Other Ambulatory Visit: Payer: Medicare Other | Admitting: Urology

## 2024-01-08 NOTE — Addendum Note (Signed)
 Addended by: Elease Etienne A on: 01/08/2024 04:59 PM   Modules accepted: Orders

## 2024-01-08 NOTE — Progress Notes (Signed)
 Remote pacemaker transmission.

## 2024-01-28 ENCOUNTER — Telehealth: Payer: Self-pay

## 2024-01-28 NOTE — Telephone Encounter (Signed)
 Auth Submission: NO AUTH NEEDED Site of care: [facility] Payer: Medicare A/B, medicare supp. Medication & CPT/J Code(s) submitted: BCG J9030 Route of submission (phone, fax, portal): portal Phone # Fax # Auth type: Buy/Bill HB Units/visits requested:  Reference number:  Approval from: 10/23/23 to 10/21/24

## 2024-02-04 ENCOUNTER — Ambulatory Visit (INDEPENDENT_AMBULATORY_CARE_PROVIDER_SITE_OTHER): Admitting: Physician Assistant

## 2024-02-04 DIAGNOSIS — D494 Neoplasm of unspecified behavior of bladder: Secondary | ICD-10-CM

## 2024-02-04 DIAGNOSIS — C679 Malignant neoplasm of bladder, unspecified: Secondary | ICD-10-CM | POA: Diagnosis not present

## 2024-02-04 LAB — MICROSCOPIC EXAMINATION
Bacteria, UA: NONE SEEN
RBC, Urine: NONE SEEN /HPF (ref 0–2)

## 2024-02-04 LAB — URINALYSIS, COMPLETE
Bilirubin, UA: NEGATIVE
Ketones, UA: NEGATIVE
Leukocytes,UA: NEGATIVE
Nitrite, UA: NEGATIVE
Protein,UA: NEGATIVE
RBC, UA: NEGATIVE
Specific Gravity, UA: 1.01 (ref 1.005–1.030)
Urobilinogen, Ur: 0.2 mg/dL (ref 0.2–1.0)
pH, UA: 7 (ref 5.0–7.5)

## 2024-02-04 MED ORDER — BCG LIVE 50 MG IS SUSR
3.2400 mL | Freq: Once | INTRAVESICAL | Status: AC
  Administered 2024-02-04: 81 mg via INTRAVESICAL

## 2024-02-04 NOTE — Progress Notes (Signed)
 BCG Bladder Instillation  BCG # 1 of 3  Due to Bladder Cancer patient is present today for a BCG treatment. Patient was cleaned and prepped in a sterile fashion with betadine. A 14FR catheter was inserted, urine return was noted 50ml, urine was yellow in color.  50ml of reconstituted BCG was instilled into the bladder. The catheter was then removed. Patient tolerated well, no complications were noted  Performed by: Zeven Kocak, PA-C and Jessica Qualls, CMA  Follow up: 1 week

## 2024-02-04 NOTE — Patient Instructions (Signed)
 Your Timeline for Today:  Right now through 4:35pm: Hold your urine and do your quarter turns every 15 minutes. 4:35pm-10:35pm today: Every time you urinate, pour 1/2 cup of bleach into the toilet and let it sit for 15 minutes prior to flushing. 10:35pm onward: Resume your normal routine.   Patient Education: (BCG) Into the Bladder (Intravesical Chemotherapy)  BCG is a vaccine which is used to prevent tuberculosis (TB).  But it's also a helpful treatment for some early bladder cancers.  When BCG goes directly into the bladder the treatment is described as intravesical.  BCG is a type of immunotherapy.  Immunotherapy stimulates the body's immune system to destroy cancer cells.  How it's given BCG treatment is given to you in an outpatient setting.  It takes a few minutes to administer and you can go home as soon as it's finished.  It might be a good idea to ask someone to bring you, particularly the fist time.  Unlike chemotherapy into the bladder, BCG treatment is never given immediately after surgery to remove bladder tumors.  There needs to be a delay usually of at least two weeks after surgery, before you can have it.  You won't be given treatment with BCG if you are unwell or have an infection in your urine.  You're usually asked to limit the amount you drink before your treatment.  This will help to increase the concentration of BCG in your bladder.  Drinking too much before your treatment may make your bladder feel uncomfortably full.  If you normally take water tablets (diuretics) take them later in the day after your treatment.  Your nurse or doctor will give you more advise about preparing for your treatment.  You will have a small tube (catheter) placed into your bladder.  Your doctor will then put the liquid vaccine directly into your bladder through the catheter and remove the catheter.  You will need to hold your urine for two hours afterwards.  Rotating every 15 minutes from side to  side. This can be difficult but it's to give the treatment time to work.  When the treatment is over you can go to the toilet.  After your treatment there are some precautions you'll need to take.  This is because BCG is a live vaccine and other people shouldn't be exposed to it.  For the next six hours, you'll need to avoid your urine splashing on the toilet seat and getting any urine on your hands.  It might be easer for men to sit down when they're using an ordinary toilet although using a stand up urinal should be alright.  The main this is to avoid splashing urine and spreading the vaccine.  You will also be asked to put 1/2 cup undiluted bleach into the toilet to destroy any live vaccine and leave it for 15 minutes until you flush for the next 6 voids.  Side Effects Because BCG goes directly into the bladder most of the side effects are linked with the bladder.  They usually go away within one to two days after your treatment.  The most common ones are: -needing to pass urine often -pain when you pass urine -blood in urine -flu-like symptoms (tiredness, general aching and raised temperature)  Theses side effects should settle down within a day or two.  If they don't get better contact your doctor.  Drinking lots of fluids can help flush the drug out of your bladder and reduce some of these effects.  Taking Ibuprofen or Aleve is encouraged unless you have a condition that would make these medications unsafe to take (renal failure, diabetes, gerd)  Rare side effects can include a continuing high temperature (fever), pain in your joints and a cough.  If you have any of these symptoms, or if you feel generally unwell, contact your doctor.  These symptoms could be a sign of a more serious infection (due to BCG) that needs to be treated immediately.  If this happens you'll be treated with the same drugs (antibiotics) that are used to treat TB.  Contraception Men should use a condom during sex for  the first 48 hours after their treatment.  If you are a women who has had BCG treatment then your partner should use a condom.  Using a condom will protect your partner from any vaccine present in your semen or vaginal fluid.  We don't know how BCG may affect a developing fetus so it's not advisable to become pregnant or father a child while having it.  It is important to use effective contraception during your treatment and for six weeks afterwards.  You can discuss this with your doctor or specialist nurse.

## 2024-02-11 ENCOUNTER — Ambulatory Visit (INDEPENDENT_AMBULATORY_CARE_PROVIDER_SITE_OTHER): Admitting: Physician Assistant

## 2024-02-11 DIAGNOSIS — D494 Neoplasm of unspecified behavior of bladder: Secondary | ICD-10-CM

## 2024-02-11 DIAGNOSIS — C679 Malignant neoplasm of bladder, unspecified: Secondary | ICD-10-CM | POA: Diagnosis not present

## 2024-02-11 LAB — MICROSCOPIC EXAMINATION: Bacteria, UA: NONE SEEN

## 2024-02-11 LAB — URINALYSIS, COMPLETE
Bilirubin, UA: NEGATIVE
Ketones, UA: NEGATIVE
Leukocytes,UA: NEGATIVE
Nitrite, UA: NEGATIVE
Protein,UA: NEGATIVE
RBC, UA: NEGATIVE
Specific Gravity, UA: 1.01 (ref 1.005–1.030)
Urobilinogen, Ur: 0.2 mg/dL (ref 0.2–1.0)
pH, UA: 7 (ref 5.0–7.5)

## 2024-02-11 MED ORDER — BCG LIVE 50 MG IS SUSR
3.2400 mL | Freq: Once | INTRAVESICAL | Status: AC
  Administered 2024-02-11: 81 mg via INTRAVESICAL

## 2024-02-11 NOTE — Progress Notes (Signed)
 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 Patient was cleaned and prepped in a sterile fashion with betadine . A 14FR catheter was inserted, urine return was noted 5ml, urine was yellow in color.  50ml of reconstituted BCG was instilled into the bladder. The catheter was then removed. Patient tolerated well, no complications were noted  Performed by: Enrrique Mierzwa, PA-C and Jessica Qualls, CMA  Follow up/ Additional notes: 1 week for BCG #3

## 2024-02-18 ENCOUNTER — Ambulatory Visit (INDEPENDENT_AMBULATORY_CARE_PROVIDER_SITE_OTHER): Admitting: Physician Assistant

## 2024-02-18 DIAGNOSIS — D494 Neoplasm of unspecified behavior of bladder: Secondary | ICD-10-CM | POA: Diagnosis not present

## 2024-02-18 LAB — URINALYSIS, COMPLETE
Bilirubin, UA: NEGATIVE
Ketones, UA: NEGATIVE
Leukocytes,UA: NEGATIVE
Nitrite, UA: NEGATIVE
Protein,UA: NEGATIVE
RBC, UA: NEGATIVE
Specific Gravity, UA: 1.01 (ref 1.005–1.030)
Urobilinogen, Ur: 0.2 mg/dL (ref 0.2–1.0)
pH, UA: 6 (ref 5.0–7.5)

## 2024-02-18 LAB — MICROSCOPIC EXAMINATION: Bacteria, UA: NONE SEEN

## 2024-02-18 MED ORDER — BCG LIVE 50 MG IS SUSR
3.2400 mL | Freq: Once | INTRAVESICAL | Status: AC
  Administered 2024-02-18: 81 mg via INTRAVESICAL

## 2024-02-18 NOTE — Progress Notes (Signed)
 BCG Bladder Instillation  BCG # 3 of 3  Due to Bladder Cancer patient is present today for a BCG treatment. Patient was cleaned and prepped in a sterile fashion with betadine . A 14FR catheter was inserted, urine return was noted , urine was yellow in color.  50ml of reconstituted BCG was instilled into the bladder. The catheter was then removed. Patient tolerated well, no complications were noted  Performed by: Tyris Eliot, PA-C and Jessica Qualls, CMA  Follow up/ Additional notes: 2 months for cysto and right ureteroscopy under anesthesia, order placed

## 2024-02-19 ENCOUNTER — Other Ambulatory Visit: Payer: Self-pay

## 2024-02-19 DIAGNOSIS — C669 Malignant neoplasm of unspecified ureter: Secondary | ICD-10-CM

## 2024-02-19 NOTE — Progress Notes (Signed)
 Surgical Physician Order Form [facility] [place]  Dr. Cherylene Corrente * Scheduling expectation : June 2025  *Length of Case:   *Clearance needed: yes  *Anticoagulation Instructions: Hold all anticoagulants  *Aspirin  Instructions: N/A  *Post-op visit Date/Instructions:  TBD  *Diagnosis: Urothelial carcinoma  *Procedure: Cysto under anesthesia + right ureteroscopy, possible biopsy, possible ablation, possible stent placement   Additional orders: N/A  -Admit type: OUTpatient  -Anesthesia: Choice  -VTE Prophylaxis Standing Order SCD's       Other:   -Standing Lab Orders Per Anesthesia    Lab other: UA&Urine Culture  -Standing Test orders EKG/Chest x-ray per Anesthesia       Test other:   - Medications:  Ancef  2gm IV  -Other orders:  N/A

## 2024-02-24 ENCOUNTER — Telehealth: Payer: Self-pay | Admitting: *Deleted

## 2024-02-24 ENCOUNTER — Telehealth: Payer: Self-pay

## 2024-02-24 NOTE — Progress Notes (Signed)
  Phone Number: [phone] for Surgical Coordinator Fax Number: [phone]  REQUEST FOR SURGICAL CLEARANCE       Date: Date: 02/24/24  Faxed to: Dr. Parks Bollman, MD  Surgeon: Dr. Darlynn Elam, MD     Date of Surgery: 03/31/2024  Operation: Cystoscopy, Diagnostic Right Ureteroscopy with possible Right Ureteral Biopsy and Possible Ablation, Possible Right Ureteral Stent Exchange   Anesthesia Type: General   Diagnosis: Urothelial Carcinoma  Patient Requires:   Cardiac / Vascular Clearance : Yes  Reason: Would like for patient to hold Eliquis  prior to surgery   Risk Assessment:    Low   []       Moderate   []     High   []           This patient is optimized for surgery  YES []       NO   []    I recommend further assessment/workup prior to surgery. YES []      NO  []   Appointment scheduled for: _______________________   Further recommendations: ____________________________________     Physician Signature:__________________________________   Printed Name: ________________________________________   Date: _________________   .

## 2024-02-24 NOTE — Telephone Encounter (Signed)
   Pre-operative Risk Assessment    Patient Name: [facility].  DOB: [birth year] MRN: [id number]   Date of last office visit: 05/30/23 Adaline Holly, NP Date of next office visit: NONE   Request for Surgical Clearance    Procedure:   CYSTOSCOPY, DIAGNOSTIC RIGHT URETEROSCOPY WITH POSSIBLE RIGHT URETHRAL Bx AND POSSIBLE ABLATION, POSSIBLE RIGHT URETHRAL STENT EXCHANGE  Date of Surgery:  Clearance 03/31/24                                Surgeon:  DR. Geralyn Knee STOIOFF Surgeon's Group or Practice Name:  [facility] Phone number:  [phone] Fax number:  [phone] MELISSA HADLEY, CMA   Type of Clearance Requested:   - Medical  - Pharmacy:  Hold Apixaban  (Eliquis ) AS WELL AS PACEMAKER CLEARANCE   Type of Anesthesia:  General    Additional requests/questions:    [place] Broom   02/24/2024, 5:29 PM

## 2024-02-24 NOTE — Progress Notes (Signed)
   [facility] Surgical Posting Form  Surgery Date: Date: 03/31/2024  Surgeon: Dr. Darlynn Elam, MD  Inpt ( No  )   Outpt (Yes)   Obs ( No  )   Diagnosis: C68.9 Urothelial Carcinoma  -CPT: [phone], [phone]  Surgery: Cystoscopy, Diagnostic Right Ureteroscopy with possible Right Ureteral Biopsy and Possible Ablation, Possible Right Ureteral Stent Exchange  Stop Anticoagulations: Yes, will need to hold Eliquis   Cardiac/Medical/Pulmonary Clearance needed: yes  Clearance needed from Dr: Parks Bollman and [facility]  Clearance request sent on: Date: 02/24/24  *Orders entered into EPIC  Date: 02/24/24   *Case booked in EPIC  Date: 02/21/2024  *Notified pt of Surgery: Date: 02/21/2024  PRE-OP UA & CX: Yes, will obtain in clinic on 03/20/2024  *Placed into Prior Authorization Work [place] Date: 02/24/24  Assistant/laser/rep:No

## 2024-02-24 NOTE — Progress Notes (Signed)
  Phone Number: [phone] for Surgical Coordinator Fax Number: [phone]  REQUEST FOR SURGICAL CLEARANCE       Date: Date: 02/24/24  Faxed to: [facility]/Dr. Marven Slimmer  Surgeon: Dr. Darlynn Elam, MD     Date of Surgery: 03/31/2024  Operation: Cystoscopy, Diagnostic Right Ureteroscopy with possible Right Ureteral Biopsy and Possible Ablation, Possible Right Ureteral Stent Exchange   Anesthesia Type: General   Diagnosis: Urothelial Carcinoma  Patient Requires:   Cardiac / Vascular Clearance : Yes  Reason: Patient has a pacemaker managed by Dr. Marven Slimmer   Risk Assessment:    Low   []       Moderate   []     High   []           This patient is optimized for surgery  YES []       NO   []    I recommend further assessment/workup prior to surgery. YES []      NO  []   Appointment scheduled for: _______________________   Further recommendations: ____________________________________     Physician Signature:__________________________________   Printed Name: ________________________________________   Date: _________________

## 2024-02-24 NOTE — Telephone Encounter (Signed)
 Per Dr. Cherylene Corrente, Patient is to be scheduled for Cystoscopy, Diagnostic Right Ureteroscopy with possible Right Ureteral Biopsy and Possible Ablation, Possible Right Ureteral Stent Exchange   Mr. Essa was contacted and possible surgical dates were discussed, Tuesday June 10th, 2025 was agreed upon for surgery.   Patient was instructed that Dr. Cherylene Corrente will require them to provide a pre-op UA & CX prior to surgery. This was ordered and scheduled drop off appointment was made for 03/20/2024.    Patient was directed to call [phone] between 1-3pm the day before surgery to find out surgical arrival time.  Instructions were given not to eat or drink from midnight on the night before surgery and have a driver for the day of surgery. On the surgery day patient was instructed to enter through the [facility] entrance of [facility] report the Same Day Surgery desk.   Pre-Admit Testing will be in contact via phone to set up an interview with the anesthesia team to review your history and medications prior to surgery.   Reminder of this information was sent via MyChart to the patient.

## 2024-02-25 ENCOUNTER — Encounter: Payer: Self-pay | Admitting: Cardiology

## 2024-02-25 NOTE — Progress Notes (Signed)
 dPERIOPERATIVE PRESCRIPTION FOR IMPLANTED CARDIAC DEVICE PROGRAMMING   Patient Information:  Patient: Collin Knight.  MRN: [id number]  Date of Birth: 1945-12-15   Procedure:   CYSTOSCOPY, DIAGNOSTIC RIGHT URETEROSCOPY WITH POSSIBLE RIGHT URETHRAL Bx AND POSSIBLE ABLATION, POSSIBLE RIGHT URETHRAL STENT EXCHANGE Date of Surgery:  Clearance 03/31/24                              Surgeon:  DR. Geralyn Knee STOIOFF Surgeon's Group or Practice Name:  [facility] Phone number:  [phone] Fax number:  [phone] Sheryn Doom, CMA     Device Information:   Clinic EP Physician:   Dr. Harvie Liner Device Type:  Pacemaker Manufacturer and Phone #:  Medtronic: [phone] Pacemaker Dependent?:  Unknown Date of Last Device Check:  12/03/23        Normal Device Function?:  Yes     Electrophysiologist's Recommendations:   Have magnet available. Provide continuous ECG monitoring when magnet is used or reprogramming is to be performed.  Procedure may interfere with device function.  Magnet should be placed over device during procedure.  Per Device Clinic Standing Orders, [name]  02/25/2024 9:16 AM

## 2024-03-02 ENCOUNTER — Other Ambulatory Visit: Payer: Self-pay | Admitting: Urology

## 2024-03-02 DIAGNOSIS — C669 Malignant neoplasm of unspecified ureter: Secondary | ICD-10-CM

## 2024-03-02 DIAGNOSIS — C679 Malignant neoplasm of bladder, unspecified: Secondary | ICD-10-CM

## 2024-03-03 NOTE — Telephone Encounter (Signed)
   Name: [facility].  DOB: Apr 04, 1946  MRN: [id number]   Primary Cardiologist: Percival Brace, MD  Chart reviewed as part of pre-operative protocol coverage.   Per Pharm D, patient may hold Eliquis  for 3 days prior to procedure.    Patient is followed for general cardiology by Dr. Parks Bollman at [facility]. Will defer general cardiology preoperative evaluation to Dr. Parks Bollman and his team.    I will route this recommendation to the requesting party via Epic fax function and remove from pre-op pool. Please call with questions.  Morey Ar, NP 03/03/2024, 4:44 PM

## 2024-03-03 NOTE — Telephone Encounter (Signed)
 Patient with diagnosis of A Fib on Eliquis  for anticoagulation.    Procedure: CYSTOSCOPY, DIAGNOSTIC RIGHT URETEROSCOPY WITH POSSIBLE RIGHT URETHRAL Bx AND POSSIBLE ABLATION, POSSIBLE RIGHT URETHRAL STENT EXCHANGE  Date of procedure: 03/31/24   CHA2DS2-VASc Score = 3  This indicates a 3.2% annual risk of stroke. The patient's score is based upon: CHF History: 1 HTN History: 0 Diabetes History: 0 Stroke History: 0 Vascular Disease History: 0 Age Score: 2 Gender Score: 0    CrCl 71 ml/min Platelet count 276K   Per office protocol, patient can hold Elqiuis for 3 days prior to procedure.    **This guidance is not considered finalized until pre-operative APP has relayed final recommendations.**

## 2024-03-03 NOTE — Telephone Encounter (Signed)
   Name: [facility].  DOB: Apr 20, 1946  MRN: [id number]  Primary Cardiologist: Percival Brace, MD   Preoperative team, please contact this patient and set up a phone call appointment for further preoperative risk assessment. Please obtain consent and complete medication review. Thank you for your help.  I confirm that guidance regarding antiplatelet and oral anticoagulation therapy has been completed and, if necessary, noted below.  Per Pharm D, patient may hold Eliquis  for 3 days prior to procedure.    I also confirmed the patient resides in the state of [place] . As per [facility] telemedicine laws, the patient must reside in the state in which the provider is licensed.   Morey Ar, NP 03/03/2024, 3:57 PM [facility]

## 2024-03-03 NOTE — Telephone Encounter (Signed)
 Upon further review of chart by pre-op team, patient is followed by Dr. Parks Bollman at [facility]. Therefore he will not need a telephone visit. I will send recommendations for holding OAC to requesting office and defer general cardiology pre-op eval to Dr. Parks Bollman.   Collin Knight. [name], DNP, NP-C  03/03/2024, 4:43 PM [facility] [address] Office [phone] Fax [phone]

## 2024-03-04 ENCOUNTER — Ambulatory Visit (INDEPENDENT_AMBULATORY_CARE_PROVIDER_SITE_OTHER): Payer: Medicare Other

## 2024-03-04 DIAGNOSIS — I495 Sick sinus syndrome: Secondary | ICD-10-CM

## 2024-03-04 LAB — CUP PACEART REMOTE DEVICE CHECK
Battery Remaining Longevity: 154 mo
Battery Voltage: 3.07 V
Brady Statistic AP VP Percent: 0.04 %
Brady Statistic AP VS Percent: 98.59 %
Brady Statistic AS VP Percent: 0 %
Brady Statistic AS VS Percent: 1.37 %
Brady Statistic RA Percent Paced: 98.85 %
Brady Statistic RV Percent Paced: 0.04 %
Date Time Interrogation Session: 20250513233217
Implantable Lead Connection Status: 753985
Implantable Lead Connection Status: 753985
Implantable Lead Implant Date: 20240430
Implantable Lead Implant Date: 20240430
Implantable Lead Location: 753859
Implantable Lead Location: 753860
Implantable Lead Model: 3830
Implantable Lead Model: 5076
Implantable Pulse Generator Implant Date: 20240430
Lead Channel Impedance Value: 342 Ohm
Lead Channel Impedance Value: 361 Ohm
Lead Channel Impedance Value: 494 Ohm
Lead Channel Impedance Value: 570 Ohm
Lead Channel Pacing Threshold Amplitude: 0.75 V
Lead Channel Pacing Threshold Amplitude: 1.125 V
Lead Channel Pacing Threshold Pulse Width: 0.4 ms
Lead Channel Pacing Threshold Pulse Width: 0.4 ms
Lead Channel Sensing Intrinsic Amplitude: 2.625 mV
Lead Channel Sensing Intrinsic Amplitude: 2.625 mV
Lead Channel Sensing Intrinsic Amplitude: 25.875 mV
Lead Channel Sensing Intrinsic Amplitude: 25.875 mV
Lead Channel Setting Pacing Amplitude: 1.5 V
Lead Channel Setting Pacing Amplitude: 2.25 V
Lead Channel Setting Pacing Pulse Width: 0.4 ms
Lead Channel Setting Sensing Sensitivity: 0.9 mV
Zone Setting Status: 755011

## 2024-03-04 NOTE — Telephone Encounter (Signed)
 Spoke w/ patients wife Leary Provencal, with patient in the background - scheduled patient for 5/20 w/ Suzann Riddle, NP for pre-op clearance + overdue f/u.

## 2024-03-04 NOTE — Telephone Encounter (Signed)
   Name: [facility].  DOB: 07-21-1946  MRN: [id number]  Primary Cardiologist: Percival Brace, MD  Chart reviewed as part of pre-operative protocol coverage. Because of [facility]s past medical history and time since last visit, he will require a follow-up in-office visit in order to better assess preoperative cardiovascular risk.  Patient follows with EP. Patient does not follow with [facility] general cardiology.  He is overdue for 3-month follow-up with EP, clearance can be addressed at EP office visit.  Pre-op covering staff: - Please schedule appointment and call patient to inform them. If patient already had an upcoming appointment within acceptable timeframe, please add "pre-op clearance" to the appointment notes so provider is aware. - Please contact requesting surgeon's office via preferred method (i.e, phone, fax) to inform them of need for appointment prior to surgery.    Per office protocol, patient can hold Elqiuis for 3 days prior to procedure.      Jude Norton, NP  03/04/2024, 1:42 PM

## 2024-03-04 NOTE — Telephone Encounter (Signed)
 I will send a message to EP scheduler to reach out to the pt with an appt per preop APP. Appt to be with EP APP is fine.

## 2024-03-04 NOTE — Telephone Encounter (Signed)
 Received secure chat from surgery scheduler [place], CMA: Miller Allis- I got a clearance back from this patient and it said that patient sees [facility], so they provided device clearance and med clearance. I reached out to Dr. Parks Bollman and they said he sees [facility] and not them. Im so confused.   I will have to send back to our preop APP for review. Pt see's Dr. Julane Ny, Dr. Marven Slimmer, EP and Dr. Micael Adas for sleep.

## 2024-03-08 ENCOUNTER — Ambulatory Visit: Payer: Self-pay | Admitting: Cardiology

## 2024-03-10 ENCOUNTER — Ambulatory Visit: Attending: Cardiology | Admitting: Cardiology

## 2024-03-10 ENCOUNTER — Ambulatory Visit: Payer: Self-pay | Admitting: Cardiology

## 2024-03-10 ENCOUNTER — Encounter: Payer: Self-pay | Admitting: Cardiology

## 2024-03-10 VITALS — BP 112/70 | HR 62 | Ht 70.0 in | Wt 159.0 lb

## 2024-03-10 DIAGNOSIS — D6869 Other thrombophilia: Secondary | ICD-10-CM

## 2024-03-10 DIAGNOSIS — Z7901 Long term (current) use of anticoagulants: Secondary | ICD-10-CM | POA: Insufficient documentation

## 2024-03-10 DIAGNOSIS — Z950 Presence of cardiac pacemaker: Secondary | ICD-10-CM

## 2024-03-10 DIAGNOSIS — I495 Sick sinus syndrome: Secondary | ICD-10-CM | POA: Diagnosis not present

## 2024-03-10 DIAGNOSIS — I5032 Chronic diastolic (congestive) heart failure: Secondary | ICD-10-CM | POA: Insufficient documentation

## 2024-03-10 DIAGNOSIS — I4819 Other persistent atrial fibrillation: Secondary | ICD-10-CM | POA: Diagnosis not present

## 2024-03-10 DIAGNOSIS — I5020 Unspecified systolic (congestive) heart failure: Secondary | ICD-10-CM

## 2024-03-10 LAB — CUP PACEART INCLINIC DEVICE CHECK
Date Time Interrogation Session: 20250520160550
Implantable Lead Connection Status: 753985
Implantable Lead Connection Status: 753985
Implantable Lead Implant Date: 20240430
Implantable Lead Implant Date: 20240430
Implantable Lead Location: 753859
Implantable Lead Location: 753860
Implantable Lead Model: 3830
Implantable Lead Model: 5076
Implantable Pulse Generator Implant Date: 20240430

## 2024-03-10 NOTE — Progress Notes (Signed)
 Electrophysiology Clinic Note    Date:  03/10/2024  Patient ID:  [facility] DOB 01/26/1946, MRN [id number] PCP:  Lyle San, MD  Cardiologist:  Percival Brace, MD HF Cardiologist - [place] Electrophysiologist: Boyce Byes, MD   Discussed the use of AI scribe software for clinical note transcription with the patient, who gave verbal consent to proceed.   Patient Profile    Chief Complaint: Afib and PPM follow-up, pre-procedure evaluation  History of Present Illness: [facility]. is a 78 y.o. 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 Marven Slimmer. He has a leadless PPM left in place that was deactivated.   I last saw him 05/2023 where he was doing well without s/s of HF  On follow-up today, he continues to do very well. He has rare AF episodes. Has PRN metoprolol  to take for episodes and hasn't taken it in several  months. He has self-reduced his eliquis  to 4mg  in AM and 4mg  in PM, uses pill cutter to obtain this dosage from 5mg  pills.  He was having significant hematuria, is currently being treated for bladder cancer with plans for additional urologic procedure in the near future.   He denies chest pain, chest pressure, palpitations. No SOB or edema.    Arrhythmia/Device History Medtronic dual chamber PPM, imp 01/2023; dx SND and HFrEF RV lead is left bundle area lead   AAD History: Amiodarone      ROS:  Please see the history of present illness. All other systems are reviewed and otherwise negative.    Physical Exam    VS:  BP 112/70 (BP Location: Left Arm, Patient Position: Sitting, Cuff Size: Normal)   Pulse 62   Ht 5\' 10"  (1.778 m)   Wt 159 lb (72.1 kg)   SpO2 97%    BMI 22.81 kg/m  BMI: Body mass index is 22.81 kg/m.  Wt Readings from Last 3 Encounters:  03/10/24 159 lb (72.1 kg)  12/25/23 151 lb (68.5 kg)  10/09/23 150 lb (68 kg)     GEN- The patient is well appearing, alert and oriented x 3 today.   Lungs- Clear to ausculation bilaterally, normal work of breathing.  Heart- Regular rate and rhythm, no murmurs, rubs or gallops Extremities- No peripheral edema, warm, dry Skin-  device pocket well-healed, no tethering   Device interrogation done today and reviewed by myself:  Battery 12.7  years Lead thresholds, impedence, sensing stable  Frequent AF episode, typically brief in duration Longest 4.5h in sept 2024 ATP often ineffective Turned OFF atrial therapies No other changes made today   Studies Reviewed   Previous EP, cardiology notes.    EKG is ordered. Personal review of EKG from today shows:    EKG Interpretation Date/Time:  Tuesday Mar 10 2024 13:41:51 EDT Ventricular Rate:  62 PR Interval:  248 QRS Duration:  80 QT Interval:  446 QTC Calculation: 452 R Axis:   -46  Text Interpretation: Atrial-paced rhythm with prolonged AV conduction Left axis deviation  Confirmed by Breeley Bischof [phone]) on 03/10/2024 1:48:55 PM    TTE, 04/30/2023  1. Left ventricular ejection fraction, by estimation, is 50 to 55%. The left ventricle has low normal  function. The left ventricle has no regional wall motion abnormalities. There is mild left ventricular hypertrophy. Left ventricular diastolic parameters are consistent with Grade I diastolic dysfunction (impaired relaxation). The average left ventricular global longitudinal strain is -17.4 %. The global longitudinal strain is normal.   2. Right ventricular systolic function is normal. The right ventricular size is normal. Tricuspid regurgitation signal is inadequate for assessing PA pressure.   3. The mitral valve is abnormal. Mild mitral valve regurgitation. No evidence of mitral stenosis.  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   4. The aortic valve is tricuspid. Aortic valve regurgitation is not visualized. No aortic stenosis is present.   5. 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   6. The inferior vena cava is normal in size with greater than 50% respiratory variability, suggesting right atrial pressure of 3 mmHg.   Comparison(s): A prior study was performed on 12/14/2022. The left ventricular function has improved.    PPM insertion, 02/19/2023 1. Symptomatic bradycardia with a previously placed Micra leadless pacemaker 2. 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 Successful implant of a dual chamber permanent pacemaker with left bundle area lead 4. No early apparent complications.    TTE, 12/14/2022  1. Left ventricular ejection fraction, by estimation, is 25 to 30%. The left ventricle has severely decreased function. The left ventricle demonstrates global hypokinesis. The left ventricular internal cavity size was mildly dilated. There is mild left ventricular hypertrophy. Left ventricular diastolic parameters are indeterminate. The average left ventricular global longitudinal strain is -9.9 %. The global longitudinal strain is abnormal.   2. Right ventricular systolic function is normal. The right ventricular size is normal. There is normal pulmonary artery systolic pressure.   3. The mitral valve is normal in structure. Moderate mitral valve regurgitation. No evidence of mitral stenosis.   4. Tricuspid valve regurgitation is mild to moderate.   5. The aortic valve is normal in structure. Aortic valve regurgitation is mild. No aortic stenosis is present. Aortic valve mean gradient measures 2.0 mmHg.   6. There is borderline dilatation of the aortic root, measuring 39 mm.   7. The inferior vena cava is normal in size with greater than 50% respiratory variability, suggesting right atrial  pressure of 3 mmHg.      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  Therefore, the patient's annual risk of stroke is 3.2 %.    Stroke ppx - 5mg  eliquis  BID, appropriately dosed Patient taking 4mg , recommended he increase to 5mg  BID No bleeding concerns Per previously obtained pharm clearance, ok to hold eliquis  for 3 days prior to procedure    #)HFimpEF NYHA I-II symptoms Warm and dry on exam, euvolemic GDMT: farxiga , losartan  25, spiro 12.5 Diuretic: 40mg  daily lasix  PRN        Current medicines are reviewed at length  with the patient today.   The patient does not have concerns regarding his medicines.  The following changes were made today:  none  Labs/ tests ordered today include:  Orders Placed This Encounter  Procedures   TSH   T4, free   EKG 12-Lead     Disposition: Follow up with Dr. Marven Slimmer or EP APP in 6 months   Signed, Dannica Bickham, NP  03/10/24  2:24 PM  Electrophysiology [facility]

## 2024-03-10 NOTE — Patient Instructions (Addendum)
 Medication Instructions:  The current medical regimen is effective;  continue present plan and medications as directed. Please refer to the Current Medication list given to you today.   *If you need a refill on your cardiac medications before your next appointment, please call your pharmacy*  Lab Work: Your provider would like for you to have following labs drawn today TSH, T4.    If you have labs (blood work) drawn today and your tests are completely normal, you will receive your results only by: MyChart Message (if you have MyChart) OR A paper copy in the mail If you have any lab test that is abnormal or we need to change your treatment, we will call you to review the results.  Follow-Up: At [facility], you and your health needs are our priority.  As part of our continuing mission to provide you with exceptional heart care, our providers are all part of one team.  This team includes your primary Cardiologist (physician) and Advanced Practice Providers or APPs (Physician Assistants and Nurse Practitioners) who all work together to provide you with the care you need, when you need it.  Your next appointment:   6 month(s)  Provider:   Harvie Liner, MD or Suzann Riddle, NP    We recommend signing up for the patient portal called "MyChart".  Sign up information is provided on this After Visit Summary.  MyChart is used to connect with patients for Virtual Visits (Telemedicine).  Patients are able to view lab/test results, encounter notes, upcoming appointments, etc.  Non-urgent messages can be sent to your provider as well.   To learn more about what you can do with MyChart, go to [email or web address].

## 2024-03-11 LAB — T4, FREE: Free T4: 1.19 ng/dL (ref 0.82–1.77)

## 2024-03-11 LAB — TSH: TSH: 1.09 u[IU]/mL (ref 0.450–4.500)

## 2024-03-15 ENCOUNTER — Other Ambulatory Visit: Payer: Self-pay | Admitting: Internal Medicine

## 2024-03-20 ENCOUNTER — Other Ambulatory Visit

## 2024-03-20 ENCOUNTER — Telehealth: Payer: Self-pay

## 2024-03-20 DIAGNOSIS — C669 Malignant neoplasm of unspecified ureter: Secondary | ICD-10-CM

## 2024-03-20 LAB — URINALYSIS, COMPLETE
Bilirubin, UA: NEGATIVE
Ketones, UA: NEGATIVE
Leukocytes,UA: NEGATIVE
Nitrite, UA: NEGATIVE
Protein,UA: NEGATIVE
RBC, UA: NEGATIVE
Specific Gravity, UA: 1.01 (ref 1.005–1.030)
Urobilinogen, Ur: 0.2 mg/dL (ref 0.2–1.0)
pH, UA: 6.5 (ref 5.0–7.5)

## 2024-03-20 LAB — MICROSCOPIC EXAMINATION

## 2024-03-20 NOTE — Telephone Encounter (Signed)
 Received clearance from [facility]- Patient is to hold his Eliquis  for 3 days prior to surgery. Left detailed message on patients voicemail. Patient encouraged to call back with any questions or concerns.

## 2024-03-23 LAB — CULTURE, URINE COMPREHENSIVE

## 2024-03-24 ENCOUNTER — Encounter
Admission: RE | Admit: 2024-03-24 | Discharge: 2024-03-24 | Disposition: A | Discharge status: Home / Self Care | Source: Ambulatory Visit | Attending: Urology | Admitting: Urology

## 2024-03-24 ENCOUNTER — Other Ambulatory Visit: Payer: Self-pay

## 2024-03-24 DIAGNOSIS — I5032 Chronic diastolic (congestive) heart failure: Secondary | ICD-10-CM

## 2024-03-24 DIAGNOSIS — Z7901 Long term (current) use of anticoagulants: Secondary | ICD-10-CM

## 2024-03-24 DIAGNOSIS — R030 Elevated blood-pressure reading, without diagnosis of hypertension: Secondary | ICD-10-CM

## 2024-03-24 NOTE — Patient Instructions (Addendum)
 Your procedure is scheduled on: 03/31/24 - Tuesday Report to the Registration Desk on the 1st floor of the [facility]. To find out your arrival time, please call [phone] between 1PM - 3PM on: 03/30/24 - Monday If your arrival time is 6:00 am, do not arrive before that time as the Medical Mall entrance doors do not open until 6:00 am.  REMEMBER: Instructions that are not followed completely may result in serious medical risk, up to and including death; or upon the discretion of your surgeon and anesthesiologist your surgery may need to be rescheduled.  Do not eat food or drink any liquids after midnight the night before surgery.  No gum chewing or hard candies.  One week prior to surgery: Stop Anti-inflammatories (NSAIDS) such as Advil, Aleve, Ibuprofen, Motrin, Naproxen, Naprosyn and Aspirin  based products such as Excedrin, Goody's Powder, BC Powder.You may take Tylenol  if needed for pain up until the day of surgery.  Stop ANY OVER THE COUNTER supplements until after surgery : VITAMIN D3 , MULTIVITAMIN .  HOLD Eliquis  beginning 03/27/24, resume taking with doctors orders.  HOLD FARXIGA  beginning 03/27/24, resume taking after surgery.  Continue taking all of your other prescription medications up until the day before surgery.  ON THE DAY OF SURGERY ONLY TAKE THESE MEDICATIONS WITH SIPS OF WATER:  amiodarone  (PACERONE )  sertraline  (ZOLOFT )  solifenacin  (VESICARE )    No Alcohol for 24 hours before or after surgery.  No Smoking including e-cigarettes for 24 hours before surgery.  No chewable tobacco products for at least 6 hours before surgery.  No nicotine patches on the day of surgery.  Do not use any "recreational" drugs for at least a week (preferably 2 weeks) before your surgery.  Please be advised that the combination of cocaine and anesthesia may have negative outcomes, up to and including death. If you test positive for cocaine, your surgery will be cancelled.  On  the morning of surgery brush your teeth with toothpaste and water, you may rinse your mouth with mouthwash if you wish. Do not swallow any toothpaste or mouthwash.  Use CHG Soap or wipes as directed on instruction sheet.  Do not wear jewelry, make-up, hairpins, clips or nail polish.  For welded (permanent) jewelry: bracelets, anklets, waist bands, etc.  Please have this removed prior to surgery.  If it is not removed, there is a chance that hospital personnel will need to cut it off on the day of surgery.  Do not wear lotions, powders, or perfumes.   Do not shave body hair from the neck down 48 hours before surgery.  Contact lenses, hearing aids and dentures may not be worn into surgery.  Do not bring valuables to the hospital. [facility] is not responsible for any missing/lost belongings or valuables.   Notify your doctor if there is any change in your medical condition (cold, fever, infection).  Wear comfortable clothing (specific to your surgery type) to the hospital.  After surgery, you can help prevent lung complications by doing breathing exercises.  Take deep breaths and cough every 1-2 hours. Your doctor may order a device called an Incentive Spirometer to help you take deep breaths. When coughing or sneezing, hold a pillow firmly against your incision with both hands. This is called "splinting." Doing this helps protect your incision. It also decreases belly discomfort.  If you are being admitted to the hospital overnight, leave your suitcase in the car. After surgery it may be brought to your room.  In  case of increased patient census, it may be necessary for you, the patient, to continue your postoperative care in the Same Day Surgery department.  If you are being discharged the day of surgery, you will not be allowed to drive home. You will need a responsible individual to drive you home and stay with you for 24 hours after surgery.   If you are taking public  transportation, you will need to have a responsible individual with you.  Please call the Pre-admissions Testing Dept. at [phone] if you have any questions about these instructions.  Surgery Visitation Policy:  Patients having surgery or a procedure may have two visitors.  Children under the age of 57 must have an adult with them who is not the patient.  Inpatient Visitation:    Visiting hours are 7 a.m. to 8 p.m. Up to four visitors are allowed at one time in a patient room. The visitors may rotate out with other people during the day.  One visitor age 80 or older may stay with the patient overnight and must be in the room by 8 p.m.

## 2024-03-25 ENCOUNTER — Encounter: Payer: Self-pay | Admitting: Urgent Care

## 2024-03-25 ENCOUNTER — Encounter
Admission: RE | Admit: 2024-03-25 | Discharge: 2024-03-25 | Disposition: A | Discharge status: Home / Self Care | Source: Ambulatory Visit | Attending: Urology | Admitting: Urology

## 2024-03-25 DIAGNOSIS — R030 Elevated blood-pressure reading, without diagnosis of hypertension: Secondary | ICD-10-CM | POA: Insufficient documentation

## 2024-03-25 DIAGNOSIS — I5032 Chronic diastolic (congestive) heart failure: Secondary | ICD-10-CM | POA: Insufficient documentation

## 2024-03-25 DIAGNOSIS — Z7901 Long term (current) use of anticoagulants: Secondary | ICD-10-CM | POA: Diagnosis not present

## 2024-03-25 DIAGNOSIS — Z01812 Encounter for preprocedural laboratory examination: Secondary | ICD-10-CM | POA: Diagnosis present

## 2024-03-25 LAB — CBC
HCT: 44.4 % (ref 39.0–52.0)
Hemoglobin: 14.9 g/dL (ref 13.0–17.0)
MCH: 34.7 pg — ABNORMAL HIGH (ref 26.0–34.0)
MCHC: 33.6 g/dL (ref 30.0–36.0)
MCV: 103.5 fL — ABNORMAL HIGH (ref 80.0–100.0)
Platelets: 305 10*3/uL (ref 150–400)
RBC: 4.29 MIL/uL (ref 4.22–5.81)
RDW: 12.7 % (ref 11.5–15.5)
WBC: 7.4 10*3/uL (ref 4.0–10.5)
nRBC: 0 % (ref 0.0–0.2)

## 2024-03-25 LAB — BASIC METABOLIC PANEL WITH GFR
Anion gap: 9 (ref 5–15)
BUN: 14 mg/dL (ref 8–23)
CO2: 24 mmol/L (ref 22–32)
Calcium: 9.2 mg/dL (ref 8.9–10.3)
Chloride: 105 mmol/L (ref 98–111)
Creatinine, Ser: 0.87 mg/dL (ref 0.61–1.24)
GFR, Estimated: >60 mL/min (ref >60)
Glucose, Bld: 89 mg/dL (ref 70–99)
Potassium: 4.5 mmol/L (ref 3.5–5.1)
Sodium: 138 mmol/L (ref 135–145)

## 2024-03-30 ENCOUNTER — Encounter: Payer: Self-pay | Admitting: Urology

## 2024-03-30 NOTE — Progress Notes (Signed)
 Perioperative / Anesthesia Services  Pre-Admission Testing Clinical Review / Pre-Operative Anesthesia Consult  Date: 03/30/24  PATIENT DEMOGRAPHICS: Name: [facility]. DOB: [birth year] MRN:   [id number]  Note: Available PAT nursing documentation and vital signs have been reviewed. Clinical nursing staff has updated patient's PMH/PSHx, current medication list, and drug allergies/intolerances to ensure complete and comprehensive history available to assist care teams in MDM as it pertains to the aforementioned surgical procedure and anticipated anesthetic course. Extensive review of available clinical information personally performed. [place] PMH and PSHx updated with any diagnoses/procedures that  may have been inadvertently omitted during his intake with the pre-admission testing department's nursing staff.  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 is a 78 y.o. male who is submitted for pre-surgical anesthesia review and clearance prior to him undergoing the above procedure. Patient is a Former Games developer. Pertinent PMH includes: CAD, atrial fibrillation, SA node dysfunction/SSS (s/p PPM placement), pacing induced cardiomyopathy, CHF, PSVT, MVP, frequent PVCs, recurrent syncope, aortic atherosclerosis, HTN, HLD, COPD, OSAH (no nocturnal PAP therapy), urothelial carcinoma, ETOH use/abuse, anxiety.   Patient is followed by cardiology Marven Slimmer, MD). He was last seen in the cardiology clinic on 03/10/2024; notes reviewed. At the time of  his clinic visit, patient doing well overall from a cardiovascular perspective. Patient denied any chest pain, shortness of breath, PND, orthopnea, palpitations, significant peripheral edema, weakness, fatigue, vertiginous symptoms, or presyncope/syncope. Patient with a past medical history significant for cardiovascular diagnoses. Documented physical exam was grossly benign, providing no evidence of acute exacerbation and/or decompensation of the patient's known cardiovascular conditions.  Patient with sinus pauses and episodes of recurrent syncope.  Given SA node dysfunction, patient ultimately underwent placement of a Micra AV leadless pacemaker on 09/30/2020.   Following leadless pacemaker placement, patient developed RV pacing induced cardiomyopathy.  Patient experienced a significant reduction in his overall cardiac function as evidenced by a LVEF of 20-25%.  Ultimately, patient required explantation of the leadless pacemaker and insertion of a dual-chamber device (Medtronic Azure XT DR MRI SureScan) on 02/19/2023.  Pacemaker is regularly interrogated by patient's primary electrophysiology team.  Device last interrogated on 12/03/2023, at which time it was noted to be functioning properly.   Coronary CTA was performed on 12/25/2021 that demonstrated an Agatston coronary artery calcium score of 1121. This placed patient in the 33 percentile for age, sex, and race matched controls. Calcium depositions noted in the RCA and proximal to mid LAD (moderate), as well as in the ostial OM1 branch (mild) distributions.  FFR analysis was performed revealing no significant stenosis (ranges: < 0.75 high likelihood of hemodynamically significant stenosis, 0.76-0.80 borderline, > 0.80 normal):   Left Main:  No significant stenosis. LAD: No significant stenosis.  FFRct 0.84 LCX: No significant stenosis.  FFRct 0.95 RCA: No significant stenosis.  FFRct 0.85   Long-term cardiac event monitor study performed on  10/31/2022 was difficult to ascertain.  Rhythm suspected to be fibrillation with ventricular pacing versus sinus bradycardia with prolonged  AV block.  There were 17 runs of PSVT noted with the fastest lasting 29.7 seconds at a maximum rate of 148 bpm, and the longest lasting 1 minute and 35 seconds at an average rate of 97 bpm.  There were no sustained pauses or significant arrhythmias noted.   Most recent TTE was performed on 04/30/2023 revealing a low normal left ventricular systolic function with an EF of 50-55%.  There were no regional wall motion abnormalities.  There was mild LVH. Left ventricular diastolic Doppler parameters consistent with abnormal relaxation (G1DD).  GLS -17.4%.  Right ventricular size and function normal.  There was mild mitral valve regurgitation noted in the setting of mild late systolic prolapse of the posterior leaflet of the mitral valve.  All transvalvular gradients were noted to be normal providing no evidence of valvular stenosis.  There was mild dilatation of the abdominal aorta measuring up to 32 mm.  Patient with an atrial fibrillation diagnosis; CHA2DS2-VASc Score = 5 (age x 2, CHF, HTN, vascular disease history). Currently, his cardiac rate and rhythm are currently being maintained on oral amiodarone . He was being chronically anticoagulated using apixaban . Regarding his DOAC therapy, patient had self reduced to that he was perceiving as a 4 mg dose BID; using a pill cutter to obtain this dosage per his report. Dose being reduced in the setting of significant gross hematuria.  Discussed dose accuracy and recommendation to return to the prescribed 5 mg BID dose.   Blood pressure well controlled at 112/70 mmHg on currently prescribed diuretic (furosemide  + spironolactone ) and ARB (losartan ) therapies.  Patient not currently taking any type of lipid-lowering therapies for his HLD diagnosis and ASCVD prevention.  Patient is not diabetic. Given his cardiovascular diagnoses,  patient is on an SGLT2i (dapagliflozin ) for added cardiovascular and renovascular protection. Functional capacity somewhat limited by patient's age and multiple medical comorbidities.  That said, patient is able to complete all of his ADLs/IADLs without significant cardiovascular limitation.  Patient is felt to be able to achieve at least 4 METS of physical activity without experiencing any significant degree of angina/anginal equivalent symptoms. No changes were made to his medication regimen.  Patient to follow-up with outpatient cardiology/electrophysiology in 6 months or sooner if needed.   [facility]. is scheduled for an elective CYSTOSCOPY; URETEROSCOPY (Right); BIOPSY, URETER (Right); CYSTOSCOPY, FLEXIBLE, WITH STENT REPLACEMENT (Right) on 03/31/2024 with Dr. Darlynn Elam, MD. Given patient's past medical history significant for cardiovascular diagnoses, presurgical cardiac clearance was sought by the PAT team. Per cardiology, "based ACC/AHA guidelines, the patient's past medical history, and the amount of time since his last clinic visit, this patient would be at an overall ACCEPTABLE risk for the planned procedure without further cardiovascular testing or intervention at this time".   Again, this patient is on daily oral anticoagulation therapy using a DOAC medication.  He has been instructed on recommendations from his cardiologist for holding his apixaban  for 3 days prior to his procedure with plans to restart since postoperative bleeding risk felt to be minimized by his primary attending surgeon.  Patient is aware that his last dose of apixaban  should be on 03/27/2024.   Patient denies previous perioperative complications with anesthesia in the past. In review of the available records, it is noted that patient underwent a general anesthetic course here at [facility] ([facility]A III) in 07/2023 without documented complications.   MOST RECENT VITAL  SIGNS:    03/10/2024    1:35 PM  12/25/2023    2:26 PM 10/31/2023    2:16 PM  Vitals with BMI  Height 5\' 10"  5\' 10"    Weight 159 lbs 151 lbs   BMI 22.81 21.67   Systolic 112 137 865  Diastolic 70 87 99  Pulse 62 82 85   PROVIDERS/SPECIALISTS: NOTE: Primary physician provider listed below. Patient may have been seen by APP or partner within same practice.   PROVIDER ROLE / SPECIALTY LAST Adan Holms, MD Urology (Surgeon) 02/18/2024  Lyle San, MD Primary Care Provider 10/09/2023  Percival Brace, MD Cardiology 04/15/2023  Jules Oar, MD  Advanced Heart Failure 05/07/2023  Harvie Liner, MD Electrophysiology 03/10/2024   ALLERGIES: No Known Allergies  CURRENT HOME MEDICATIONS: No current facility-administered medications for this encounter.    apixaban  (ELIQUIS ) 5 MG TABS tablet   Cholecalciferol (VITAMIN D3 MAXIMUM STRENGTH) 125 MCG (5000 UT) capsule   dapagliflozin  propanediol (FARXIGA ) 10 MG TABS tablet   furosemide  (LASIX ) 40 MG tablet   losartan  (COZAAR ) 25 MG tablet   Multiple Vitamin (MULTIVITAMIN WITH MINERALS) TABS tablet   sertraline  (ZOLOFT ) 25 MG tablet   solifenacin  (VESICARE ) 10 MG tablet   vitamin B-12 (CYANOCOBALAMIN) 500 MCG tablet   amiodarone  (PACERONE ) 100 MG tablet   spironolactone  (ALDACTONE ) 25 MG tablet   HISTORY: Past Medical History:  Diagnosis Date   Alcohol use disorder, mild, abuse    10-12 oz wine daily   Anxiety    Aortic atherosclerosis (HCC)    CAD (coronary artery disease) 12/25/2021   a.) cCTA 12/25/2021: Ca2+ 1121 (76th %'ile for age/sex/race match control); mild oOM1, mod RCA and p-mLAD disease   Chronic atrial fibrillation (HCC)    a.) CHA2DS2-VASc = 5 (age x2, CHF, HTN, vascular disease history) as of 08/08/2023; b.) cardiac rate/rhythm maintained on oral amiodardone; chronically anticoagulated using apixaban    Chronic systolic CHF (congestive heart failure) (HCC)    a.) TTE 09/29/2020: EF 50-55%, no  RWMAs, mild MR; b.) TTE 11/22/2021: EF 20-25%, mild RVE with reduced RVSF, mild LAE, mod RAE; c.) TTE 12/14/2022: EF 25-30%, glob HK, mild LVH, mod MR, mild-mod TR, mild AR, Ao root 39mm; d.) TTE 04/30/2023: EF 50-55%, mild MR, mild late systolic prolapse PLMV   COPD (chronic obstructive pulmonary disease) (HCC)    Depression    Dyspnea    Frequent PVCs    Hematuria    Hemorrhoids    History of bilateral cataract extraction 2018   HLD (hyperlipidemia)    HOH (hard of hearing) - uses BILATERAL hearing aids    HTN (hypertension)    Long term current use of amiodarone     Lymphocytic colitis    MVP (mitral valve prolapse) 04/30/2023   a.) TTE 04/30/2023: mild late systolic prolapse PLMV   On apixaban  therapy    OSA (obstructive sleep apnea)    a.) no nocturnal PAP therapy; mild disease with AHI 5.8/hr   Pacing-induced cardiomyopathy (HCC)    a.) symptomatic bradycardia with RV pacing inducing cardiomypathy s/p Micra AV leadless PPM place --> device removed and dual chamber MDT XT DR MRI Surescan device placed 02/19/2023   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   a.) SA node dysfunction --> s/p Micra AV leadless PPM placement 09/30/2020; b.) developed symptomatic bradycardia with pacing induced cardiomyopathy --> Micra device explanted and new MDT Azure XT DR MRI SureScan (SN: [id number]) pdevice placed   Psoriasis    PSVT (paroxysmal supraventricular tachycardia) (HCC) 10/31/2022   a.) Zio 10/31/2022: 17 runs PSVT  with longest last 1 min 35 secs and fastestest lasting 29.7 secs at a rate of 148 bpm   Recurrent syncope    Sinus pause    SSS (sick sinus syndrome) (HCC)    a.) s/p PPM placement   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(HCC)    a.) CT hematuria 06/06/2023: 4.7 cm RIGHT posterior bladder mass --> s/p TURBT 07/09/2023 --> pathology (+) for high grade urothelial carcinoma with (+) invasion into the lamina propria   Past Surgical History:  Procedure Laterality Date   BIV  PACEMAKER INSERTION CRT-P N/A 02/19/2023   Procedure: BIV PACEMAKER INSERTION CRT-P;  Surgeon: Boyce Byes, MD;  Location[facility] INVASIVE CV LAB;  Service: Cardiovascular;  Laterality: N/A;   CATARACT EXTRACTION Bilateral 2018   COLONOSCOPY WITH PROPOFOL  N/A 09/25/2021   Procedure: COLONOSCOPY WITH PROPOFOL ;  Surgeon: Quintin Buckle, DO;  Location: [facility] ENDOSCOPY;  Service: Gastroenterology;  Laterality: N/A;   CYSTOSCOPY W/ URETERAL STENT PLACEMENT Right 08/13/2023   Procedure: CYSTOSCOPY WITH RETROGRADE PYELOGRAM/URETERAL STENT PLACEMENT;  Surgeon: Geraline Knapp, MD;  Location: [facility] ORS;  Service: Urology;  Laterality: Right;   CYSTOSCOPY WITH BIOPSY N/A 08/13/2023   Procedure: CYSTOSCOPY WITH BLADDER BIOPSY;  Surgeon: Geraline Knapp, MD;  Location: [facility] ORS;  Service: Urology;  Laterality: N/A;   PACEMAKER LEADLESS INSERTION N/A 09/30/2020   Procedure: PACEMAKER LEADLESS INSERTION;  Surgeon: Percival Brace, MD;  Location: [facility] INVASIVE CV LAB;  Service: Cardiovascular;  Laterality: N/A;   TRANSURETHRAL RESECTION OF BLADDER TUMOR N/A 07/09/2023   Procedure: TRANSURETHRAL RESECTION OF BLADDER TUMOR (TURBT);  Surgeon: Geraline Knapp, MD;  Location: [facility] ORS;  Service: Urology;  Laterality: N/A;   TRANSURETHRAL RESECTION OF BLADDER TUMOR N/A 08/13/2023   Procedure: TRANSURETHRAL RESECTION OF BLADDER TUMOR (TURBT);  Surgeon: Geraline Knapp, MD;  Location: [facility] ORS;  Service: Urology;  Laterality: N/A;   Family History  Problem Relation Age of Onset   Stroke Mother    Cancer - Lung Father    Social History   Tobacco Use   Smoking status: Former    Types: Cigarettes   Smokeless tobacco: Never  Substance Use Topics   Alcohol use: Yes    Alcohol/week: 21.0 standard drinks of alcohol    Types: 21 Cans of beer per week    Comment: 10-12 oz wine per day   LABS:  Lab Results  Component Value Date   WBC 7.4 03/25/2024   HGB 14.9 03/25/2024   HCT 44.4 03/25/2024    MCV 103.5 (H) 03/25/2024   PLT 305 03/25/2024   Lab Results  Component Value Date   NA 138 03/25/2024   CL 105 03/25/2024   K 4.5 03/25/2024   CO2 24 03/25/2024   BUN 14 03/25/2024   CREATININE 0.87 03/25/2024   GFRNONAA >60 03/25/2024   CALCIUM 9.2 03/25/2024   PHOS 3.8 11/23/2021   ALBUMIN 4.2 05/07/2023   GLUCOSE 89 03/25/2024    ECG: Date: 03/10/2024  Time ECG obtained: 1341 PM Rate: 62 bpm Rhythm: Atrial paced rhythm with prolonged AV conduction Axis (leads I and aVF): left Intervals: PR 248 ms. QRS 80 ms. QTc 452 ms. ST segment and T wave changes: No evidence of acute T wave abnormalities or significant ST segment elevation or depression.  Evidence of a possible, age undetermined, prior infarct:  No Comparison: Similar to previous tracing obtained on 09/29/2023   IMAGING / PROCEDURES: CT HEMATURIA WORKUP performed on 06/06/2023 4.7 cm right posterior bladder mass, compatible with  primary bladder carcinoma. No evidence of metastatic disease in the abdomen/pelvis. 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   TRANSTHORACIC ECHOCARDIOGRAM performed on 04/30/2023 Left ventricular ejection fraction, by estimation, is 50 to 55%. The left ventricle has low normal function. The left ventricle has no regional wall motion abnormalities. There is mild left ventricular hypertrophy. Left ventricular diastolic parameters are consistent with Grade I diastolic dysfunction (impaired relaxation). The average left ventricular global longitudinal strain is -17.4 %. The global longitudinal strain is normal.  Right ventricular systolic function is normal. The right ventricular size is normal. Tricuspid regurgitation signal is inadequate for assessing PA pressure.  The mitral valve is abnormal. Mild mitral valve regurgitation. No evidence of mitral stenosis. 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  The aortic valve is tricuspid.  Aortic valve regurgitation is not visualized. No aortic stenosis is present.  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  The inferior vena cava is normal in size with greater than 50% respiratory variability, suggesting right atrial pressure of 3 mmHg.    LONG TERM CARDIAC EVENT MONITOR STUDY performed on  10/31/2022 Patch Wear Time:  13 days and 19 hours (2023-12-18T16:09:56-0500 to 2024-01-01T11:27:41-0500) Underlying rhythm difficult to ascertain suspect AF with ventricular pacing but may be sinus brady with prolonged AV block- avg HR of 50 bpm.  17 runs of Supraventricular Tachycardia occurred, the run with the fastest interval lasting 29.7 secs with a max rate of 148 bpm, the longest lasting 1 min 35 secs with an avg rate of 97 bpm.  Rare PACs and PVCs.   CT CORONARY MORPH W/CTA COR W/SCORE W/CA W/CM &/OR WO/CM W/ FRACTIONAL FLOW RESERVE DATA PREP performed on 12/25/2021 Small BILATERAL pleural effusions with compressive atelectasis of the adjacent lower lobes. Underlying emphysematous and bronchitic changes consistent with COPD. Upper normal caliber ascending thoracic aorta 3.9 cm diameter. Aortic atherosclerosis Coronary calcium score of 1121. This was 76th percentile for age and sex matched control. Normal coronary origin with right dominance. Moderate stenosis in the RCA and proximal-mid LAD. Mild stenosis in the ostial OM1 branch. CAD-RADS 3. Moderate stenosis. Consider symptom-guided anti-ischemic pharmacotherapy as well as risk factor modification per guideline directed care. FFR analysis (ranges: < 0.75 high likelihood of hemodynamically significant stenosis, 0.76-0.80 borderline, > 0.80 normal): Left Main:  No significant stenosis. LAD: No significant stenosis.  FFRct 0.84 LCX: No significant stenosis.  FFRct 0.95 RCA: No significant stenosis.  FFRct 0.85   IMPRESSION AND PLAN: [facility]. has been referred for pre-anesthesia review and  clearance prior to him undergoing the planned anesthetic and procedural courses. Available labs, pertinent testing, and imaging results were personally reviewed by me in preparation for upcoming operative/procedural course. [facility] medical record has been updated following extensive record review and patient interview with PAT staff.   This patient has been appropriately cleared by cardiology with an overall ACCEPTABLE  risk of patient experiencing significant perioperative cardiovascular complications. Completed perioperative prescription for cardiac device management documentation completed by primary cardiology team and placed on patient's chart for review by the surgical/anesthetic team on the day of his procedure. Electrophysiology indicating that procedure should may interfere with planned surgical procedure, therefore magnet placement is recommended. Beyond normal perioperative cardiovascular monitoring, and the aforementioned magnet placement, there are no recommendations from electrophysiology team that prompt further discussion/recommendations from industry representative.   Based on clinical review performed today (03/30/24), barring any significant acute changes in the  patient's overall condition, it is anticipated that he will be able to proceed with the planned surgical intervention. Any acute changes in clinical condition may necessitate his procedure being postponed and/or cancelled. Patient will meet with anesthesia team (MD and/or CRNA) on the day of his procedure for preoperative evaluation/assessment. Questions regarding anesthetic course will be fielded at that time.   Pre-surgical instructions were reviewed with the patient during his PAT appointment, and questions were fielded to satisfaction by PAT clinical staff. He has been instructed on which medications that he will need to hold prior to surgery, as well as the ones that have been deemed safe/appropriate to take on the day of  his procedure. As part of the general education provided by PAT, patient made aware both verbally and in writing, that he would need to abstain from the use of any illegal substances during his perioperative course. He was advised that failure to follow the provided instructions could necessitate case cancellation or result in serious perioperative complications up to and including death. Patient encouraged to contact PAT and/or his surgeon's office to discuss any questions or concerns that may arise prior to surgery; verbalized understanding.   Renate Caroline, MSN, APRN, FNP-C, CEN [facility]  Perioperative Services Nurse Practitioner Phone: [phone] Fax: [phone] 03/30/24 4:48 PM  NOTE: This note has been prepared using Dragon dictation software. Despite my best ability to proofread, there is always the potential that unintentional transcriptional errors may still occur from this process.

## 2024-03-31 ENCOUNTER — Encounter: Payer: Self-pay | Admitting: Urology

## 2024-03-31 ENCOUNTER — Ambulatory Visit

## 2024-03-31 ENCOUNTER — Other Ambulatory Visit: Payer: Self-pay

## 2024-03-31 ENCOUNTER — Encounter
Admission: RE | Disposition: A | Discharge status: Home / Self Care | Payer: Self-pay | Source: Home / Self Care | Attending: Urology

## 2024-03-31 ENCOUNTER — Ambulatory Visit: Payer: Self-pay | Admitting: Urgent Care

## 2024-03-31 ENCOUNTER — Ambulatory Visit
Admission: RE | Admit: 2024-03-31 | Discharge: 2024-03-31 | Disposition: A | Discharge status: Home / Self Care | Attending: Urology | Admitting: Urology

## 2024-03-31 DIAGNOSIS — G4733 Obstructive sleep apnea (adult) (pediatric): Secondary | ICD-10-CM | POA: Insufficient documentation

## 2024-03-31 DIAGNOSIS — J449 Chronic obstructive pulmonary disease, unspecified: Secondary | ICD-10-CM | POA: Insufficient documentation

## 2024-03-31 DIAGNOSIS — Z8551 Personal history of malignant neoplasm of bladder: Secondary | ICD-10-CM | POA: Diagnosis not present

## 2024-03-31 DIAGNOSIS — F419 Anxiety disorder, unspecified: Secondary | ICD-10-CM | POA: Diagnosis not present

## 2024-03-31 DIAGNOSIS — I471 Supraventricular tachycardia, unspecified: Secondary | ICD-10-CM | POA: Insufficient documentation

## 2024-03-31 DIAGNOSIS — C679 Malignant neoplasm of bladder, unspecified: Secondary | ICD-10-CM | POA: Insufficient documentation

## 2024-03-31 DIAGNOSIS — I5022 Chronic systolic (congestive) heart failure: Secondary | ICD-10-CM | POA: Insufficient documentation

## 2024-03-31 DIAGNOSIS — E785 Hyperlipidemia, unspecified: Secondary | ICD-10-CM | POA: Insufficient documentation

## 2024-03-31 DIAGNOSIS — I429 Cardiomyopathy, unspecified: Secondary | ICD-10-CM | POA: Diagnosis not present

## 2024-03-31 DIAGNOSIS — Z7901 Long term (current) use of anticoagulants: Secondary | ICD-10-CM | POA: Insufficient documentation

## 2024-03-31 DIAGNOSIS — F32A Depression, unspecified: Secondary | ICD-10-CM | POA: Insufficient documentation

## 2024-03-31 DIAGNOSIS — I2510 Atherosclerotic heart disease of native coronary artery without angina pectoris: Secondary | ICD-10-CM | POA: Insufficient documentation

## 2024-03-31 DIAGNOSIS — N3289 Other specified disorders of bladder: Secondary | ICD-10-CM

## 2024-03-31 DIAGNOSIS — I110 Hypertensive heart disease with heart failure: Secondary | ICD-10-CM | POA: Diagnosis not present

## 2024-03-31 DIAGNOSIS — Z8554 Personal history of malignant neoplasm of ureter: Secondary | ICD-10-CM | POA: Insufficient documentation

## 2024-03-31 DIAGNOSIS — Z950 Presence of cardiac pacemaker: Secondary | ICD-10-CM | POA: Insufficient documentation

## 2024-03-31 DIAGNOSIS — C689 Malignant neoplasm of urinary organ, unspecified: Secondary | ICD-10-CM | POA: Diagnosis present

## 2024-03-31 DIAGNOSIS — I482 Chronic atrial fibrillation, unspecified: Secondary | ICD-10-CM | POA: Diagnosis not present

## 2024-03-31 DIAGNOSIS — C669 Malignant neoplasm of unspecified ureter: Secondary | ICD-10-CM

## 2024-03-31 DIAGNOSIS — Z87891 Personal history of nicotine dependence: Secondary | ICD-10-CM | POA: Diagnosis not present

## 2024-03-31 HISTORY — PX: CYSTOSCOPY: SHX5120

## 2024-03-31 HISTORY — PX: URETERAL BIOPSY: SHX6688

## 2024-03-31 HISTORY — PX: URETEROSCOPY: SHX842

## 2024-03-31 SURGERY — CYSTOSCOPY
Anesthesia: General | Site: Ureter | Laterality: Right

## 2024-03-31 MED ORDER — OXYCODONE HCL 5 MG PO TABS: 5.0000 mg | ORAL_TABLET | Freq: Once | ORAL | Status: DC | PRN

## 2024-03-31 MED ORDER — OXYCODONE HCL 5 MG/5ML PO SOLN: 5.0000 mg | Freq: Once | ORAL | Status: DC | PRN

## 2024-03-31 MED ORDER — CHLORHEXIDINE GLUCONATE 0.12 % MT SOLN
OROMUCOSAL | Status: AC
  Filled 2024-03-31: qty 15

## 2024-03-31 MED ORDER — ACETAMINOPHEN 10 MG/ML IV SOLN
INTRAVENOUS | Status: AC
  Filled 2024-03-31: qty 100

## 2024-03-31 MED ORDER — ORAL CARE MOUTH RINSE: 15.0000 mL | Freq: Once | OROMUCOSAL | Status: AC

## 2024-03-31 MED ORDER — FENTANYL CITRATE (PF) 100 MCG/2ML IJ SOLN
INTRAMUSCULAR | Status: AC
  Filled 2024-03-31: qty 2

## 2024-03-31 MED ORDER — ACETAMINOPHEN 10 MG/ML IV SOLN
INTRAVENOUS | Status: DC | PRN
  Administered 2024-03-31: 1000 mg via INTRAVENOUS

## 2024-03-31 MED ORDER — ONDANSETRON HCL 4 MG/2ML IJ SOLN
INTRAMUSCULAR | Status: AC
  Filled 2024-03-31: qty 2

## 2024-03-31 MED ORDER — LIDOCAINE HCL (PF) 2 % IJ SOLN
INTRAMUSCULAR | Status: AC
  Filled 2024-03-31: qty 5

## 2024-03-31 MED ORDER — ONDANSETRON HCL 4 MG/2ML IJ SOLN: 4.0000 mg | Freq: Once | INTRAMUSCULAR | Status: DC | PRN

## 2024-03-31 MED ORDER — DEXAMETHASONE SODIUM PHOSPHATE 10 MG/ML IJ SOLN
INTRAMUSCULAR | Status: DC | PRN
  Administered 2024-03-31: 10 mg via INTRAVENOUS

## 2024-03-31 MED ORDER — LACTATED RINGERS IV SOLN: INTRAVENOUS | Status: DC

## 2024-03-31 MED ORDER — STERILE WATER FOR IRRIGATION IR SOLN
Status: DC | PRN
  Administered 2024-03-31: 3000 mL
  Administered 2024-03-31: 1000 mL

## 2024-03-31 MED ORDER — FENTANYL CITRATE (PF) 100 MCG/2ML IJ SOLN: 25.0000 ug | INTRAMUSCULAR | Status: DC | PRN

## 2024-03-31 MED ORDER — PHENYLEPHRINE 80 MCG/ML (10ML) SYRINGE FOR IV PUSH (FOR BLOOD PRESSURE SUPPORT)
PREFILLED_SYRINGE | INTRAVENOUS | Status: DC | PRN
  Administered 2024-03-31: 80 ug via INTRAVENOUS

## 2024-03-31 MED ORDER — IOHEXOL 180 MG/ML  SOLN
INTRAMUSCULAR | Status: DC | PRN
  Administered 2024-03-31 (×2): 10 mL

## 2024-03-31 MED ORDER — CHLORHEXIDINE GLUCONATE 0.12 % MT SOLN
15.0000 mL | Freq: Once | OROMUCOSAL | Status: AC
  Administered 2024-03-31: 15 mL via OROMUCOSAL

## 2024-03-31 MED ORDER — LIDOCAINE HCL (CARDIAC) PF 100 MG/5ML IV SOSY
PREFILLED_SYRINGE | INTRAVENOUS | Status: DC | PRN
  Administered 2024-03-31: 100 mg via INTRAVENOUS

## 2024-03-31 MED ORDER — FENTANYL CITRATE (PF) 100 MCG/2ML IJ SOLN
INTRAMUSCULAR | Status: DC | PRN
  Administered 2024-03-31 (×2): 50 ug via INTRAVENOUS

## 2024-03-31 MED ORDER — SODIUM CHLORIDE 0.9 % IR SOLN
Status: DC | PRN
Start: 2024-03-31 — End: 2024-03-31
  Administered 2024-03-31: 3000 mL

## 2024-03-31 MED ORDER — CEFAZOLIN SODIUM-DEXTROSE 2-4 GM/100ML-% IV SOLN
INTRAVENOUS | Status: AC
  Filled 2024-03-31: qty 100

## 2024-03-31 MED ORDER — CEFAZOLIN SODIUM-DEXTROSE 2-4 GM/100ML-% IV SOLN
2.0000 g | INTRAVENOUS | Status: AC
  Administered 2024-03-31: 2 g via INTRAVENOUS

## 2024-03-31 MED ORDER — ACETAMINOPHEN 10 MG/ML IV SOLN: 1000.0000 mg | Freq: Once | INTRAVENOUS | Status: DC | PRN

## 2024-03-31 MED ORDER — PROPOFOL 10 MG/ML IV BOLUS
INTRAVENOUS | Status: DC | PRN
  Administered 2024-03-31: 50 mg via INTRAVENOUS
  Administered 2024-03-31: 125 ug/kg/min via INTRAVENOUS
  Administered 2024-03-31: 100 mg via INTRAVENOUS

## 2024-03-31 MED ORDER — PROPOFOL 1000 MG/100ML IV EMUL
INTRAVENOUS | Status: AC
  Filled 2024-03-31: qty 100

## 2024-03-31 MED ORDER — ONDANSETRON HCL 4 MG/2ML IJ SOLN
INTRAMUSCULAR | Status: DC | PRN
  Administered 2024-03-31: 4 mg via INTRAVENOUS

## 2024-03-31 MED ORDER — DEXAMETHASONE SODIUM PHOSPHATE 10 MG/ML IJ SOLN
INTRAMUSCULAR | Status: AC
  Filled 2024-03-31: qty 1

## 2024-03-31 SURGICAL SUPPLY — 28 items
BAG DRAIN SIEMENS DORNER NS (MISCELLANEOUS) ×3 IMPLANT
BAG PRESSURE INF REUSE 3000 (BAG) ×3 IMPLANT
BRUSH SCRUB EZ 4% CHG (MISCELLANEOUS) ×3 IMPLANT
CATH URET FLEX-TIP 2 LUMEN 10F (CATHETERS) ×2 IMPLANT
CATH URETL OPEN 5X70 (CATHETERS) ×3 IMPLANT
CATH URETL OPEN END 6X70 (CATHETERS) ×2 IMPLANT
CNTNR URN SCR LID CUP LEK RST (MISCELLANEOUS) IMPLANT
DRSG TELFA 3X4 N-ADH STERILE (GAUZE/BANDAGES/DRESSINGS) ×3 IMPLANT
FORCEPS BIOP PIRANHA Y (CUTTING FORCEPS) IMPLANT
GLOVE BIOGEL PI IND STRL 7.5 (GLOVE) ×4 IMPLANT
GOWN STRL REUS W/ TWL LRG LVL3 (GOWN DISPOSABLE) ×3 IMPLANT
GOWN STRL REUS W/ TWL XL LVL3 (GOWN DISPOSABLE) ×3 IMPLANT
GUIDEWIRE GREEN .038 145CM (MISCELLANEOUS) ×2 IMPLANT
GUIDEWIRE STR DUAL SENSOR (WIRE) ×3 IMPLANT
KIT TURNOVER CYSTO (KITS) ×3 IMPLANT
NDL SAFETY ECLIPSE 18X1.5 (NEEDLE) ×3 IMPLANT
PACK CYSTO AR (MISCELLANEOUS) ×3 IMPLANT
SET CYSTO W/LG BORE CLAMP LF (SET/KITS/TRAYS/PACK) ×3 IMPLANT
SHEATH NAVIGATOR HD 12/14X36 (SHEATH) IMPLANT
SOL .9 NS 3000ML IRR UROMATIC (IV SOLUTION) ×3 IMPLANT
STENT URET 6FRX24 CONTOUR (STENTS) IMPLANT
STENT URET 6FRX26 CONTOUR (STENTS) IMPLANT
SURGILUBE 2OZ TUBE FLIPTOP (MISCELLANEOUS) ×3 IMPLANT
SYR 10ML LL (SYRINGE) ×3 IMPLANT
SYRINGE TOOMEY IRRIG 70ML (MISCELLANEOUS) ×3 IMPLANT
WATER STERILE IRR 1000ML POUR (IV SOLUTION) ×3 IMPLANT
WATER STERILE IRR 3000ML UROMA (IV SOLUTION) ×3 IMPLANT
WATER STERILE IRR 500ML POUR (IV SOLUTION) ×2 IMPLANT

## 2024-03-31 NOTE — Discharge Instructions (Addendum)
 Transurethral Resection of Bladder Tumor (TURBT) or Bladder Biopsy   Definition:  Transurethral Resection of the Bladder Tumor is a surgical procedure used to diagnose and remove tumors within the bladder.  Cervical connecting.  General instructions:     Your recent bladder surgery requires very little post hospital care but some definite precautions.  Despite the fact that no skin incisions were used, the area around the bladder incisions are raw and covered with scabs to promote healing and prevent bleeding. Certain precautions are needed to insure that the scabs are not disturbed over the next 2-4 weeks while the healing proceeds.  Because the raw surface inside your bladder and the irritating effects of urine you may expect frequency of urination and/or urgency (a stronger desire to urinate) and perhaps even getting up at night more often. This will usually resolve or improve slowly over the healing period. You may see some blood in your urine over the first 6 weeks. Do not be alarmed, even if the urine was clear for a while. Get off your feet and drink lots of fluids until clearing occurs. If you start to pass clots or don't improve call us .  Diet:  You may return to your normal diet immediately. Because of the raw surface of your bladder, alcohol, spicy foods, foods high in acid and drinks with caffeine may cause irritation or frequency and should be used in moderation. To keep your urine flowing freely and avoid constipation, drink plenty of fluids during the day (8-10 glasses). Tip: Avoid cranberry juice because it is very acidic.  Activity:  Your physical activity doesn't need to be restricted. However, if you are very active, you may see some blood in the urine. We suggest that you reduce your activity under the circumstances until the bleeding has stopped.  Bowels:  It is important to keep your bowels regular during the postoperative period. Straining with bowel movements can cause  bleeding. A bowel movement every other day is reasonable. Use a mild laxative if needed, such as milk of magnesia 2-3 tablespoons, or 2 Dulcolax tablets. Call if you continue to have problems. If you had been taking narcotics for pain, before, during or after your surgery, you may be constipated. Take a laxative if necessary.    Medication:  You should resume your pre-surgery medications unless told not to. In addition you may be given an antibiotic to prevent or treat infection. Antibiotics are not always necessary. All medication should be taken as prescribed until the bottles are finished unless you are having an unusual reaction to one of the drugs.  You may resume your Eliquis  04/01/2024   [facility] [address] [place], [place] [address] [phone]

## 2024-03-31 NOTE — Op Note (Signed)
   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  Area erythema right posterior wall measuring ~1 cm.  Right UO resected without lesion/tumor, left UO normal.  Moderate bladder trabeculation Right ureteroscopy: Normal-appearing ureteral mucosa to the lower proximal ureter.  No tumor or erythema identified Right retrograde pyelogram proximal ureter and collecting system normal appearance without filling defects  EBL: Minimal  Specimens:  Biopsy bladder erythema  Indication: [facility]. is a 78 y.o. patient with a history of urothelial carcinoma bladder/distal ureter.  Refer to admission H&P for details.  He presents for surveillance cystoscopy/right ureteroscopy.  After reviewing the management options for treatment, he elected to proceed with the above surgical procedure(s). We have discussed the potential benefits and risks of the procedure, side effects of the proposed treatment, the likelihood of the patient achieving the goals of the procedure, and any potential problems that might occur during the procedure or recuperation. Informed consent has been obtained.  Description of procedure:  The patient was taken to the operating room and general anesthesia was induced.  The patient was placed in the dorsal lithotomy position, prepped and draped in the usual sterile fashion, and  preoperative antibiotics were administered. A preoperative time-out was performed.   A 21 French cystoscope was lubricated, placed per urethra and advanced proximally under direct vision with findings as described above.  A 71F open-ended ureteral catheter was advanced through the cystoscope and placed at the right UO.  A 0.038 Sensor wire was then placed to the ureteral catheter and into the ureteral orifice and advanced proximally under fluoroscopy into the right renal pelvis.  The cystoscope was removed and a 4.5 French semirigid ureteroscope was then placed per urethra and advanced into the bladder.  The ureteroscope was easily advanced into the right ureter alongside the guidewire to the lower proximal ureter with findings as described above.  The guidewire was removed and pullout right retrograde pyelogram was performed with findings as described above.  The cystoscope was readvanced and cold biopsies of the area of posterior wall erythema were obtained.  The biopsy sites and surrounding mucosa was then fulgurated with a Bugbee electrode.  At the completion of the procedure hemostasis was excellent.  The bladder was emptied and the cystoscope was removed.  After anesthetic reversal he was transported to the PACU in stable condition.  Plan: He will be notified with the pathology results If biopsy negative for malignancy he will be due for surveillance cystoscopy in early September 2025 and maintenance BCG in late September 2025   Geraline Knapp, M.D.

## 2024-03-31 NOTE — Anesthesia Preprocedure Evaluation (Signed)
 Anesthesia Evaluation  Patient identified by MRN, date of birth, ID band Patient awake    Reviewed: Allergy & Precautions, NPO status , Patient's Chart, lab work & pertinent test results  History of Anesthesia Complications Negative for: history of anesthetic complications  Airway Mallampati: III   Neck ROM: Full    Dental  (+) Missing, Chipped   Pulmonary sleep apnea , COPD, former smoker   Pulmonary exam normal breath sounds clear to auscultation       Cardiovascular hypertension, + CAD and +CHF (EF 50-55%)  Normal cardiovascular exam+ dysrhythmias (a fib on Eliquis ; PSVT) + pacemaker (SSS) + Valvular Problems/Murmurs MVP  Rhythm:Regular Rate:Normal  ECG 07/04/23: A-paced with prolonged AV conduction; LAD; inferior and anterior infarcts, age undetermined  Echo 04/30/23:   1. Left ventricular ejection fraction, by estimation, is 50 to 55%. The left ventricle has low normal function. The left ventricle has no regional wall motion abnormalities. There is mild left ventricular hypertrophy. Left ventricular diastolic parameters are consistent with Grade I diastolic dysfunction (impaired relaxation). The average left ventricular global longitudinal strain is -17.4 %. The global longitudinal strain is normal.   2. Right ventricular systolic function is normal. The right ventricular size is normal. Tricuspid regurgitation signal is inadequate for assessing PA pressure.   3. The mitral valve is abnormal. Mild mitral valve regurgitation. No evidence of mitral stenosis. 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   4. The aortic valve is tricuspid. Aortic valve regurgitation is not visualized. No aortic stenosis is present.   5. 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   6. The inferior vena cava is normal in size with greater than 50% respiratory variability, suggesting right atrial pressure of 3  mmHg.  Comparison(s): A prior study was performed on 12/14/2022. The left ventricular function has improved.    Neuro/Psych  PSYCHIATRIC DISORDERS Anxiety Depression    HOH; alcohol use disorder, 10-12 oz wine daily    GI/Hepatic negative GI ROS,,,  Endo/Other  negative endocrine ROS    Renal/GU negative Renal ROS     Musculoskeletal   Abdominal   Peds  Hematology negative hematology ROS (+)   Anesthesia Other Findings Reviewed and agree with [facility] pre-anesthesia clinical review note.    EP note 02/25/24:   Have magnet available.  Provide continuous ECG monitoring when magnet is used or reprogramming is to be performed.   Procedure may interfere with device function.  Magnet should be placed over device during procedure.   Cardiology note 03/10/24:  #) SND #) pacemaker-induced cardiomyopathy with leadless PPM #) s/p transvenous dual chamber PPM  PPM functioning well, see paceart for details Low VP   #) persis Afib Low overall afib burden, though continues to have paroxysms of AFib He is generally asymptomatic of episodes, thought he had not had any AFib for several tmonths Continue 100mg  amiodarone  daily Recent LFTs stable Update thyroid  labs today   #) Hypercoag d/t persis afib CHA2DS2-VASc Score = at least 3 [CHF History: 1, HTN History: 0, Diabetes History: 0, Stroke History: 0, Vascular Disease History: 0, Age Score: 2, Gender Score: 0].  Therefore, the patient's annual risk of stroke is 3.2 %.    Stroke ppx - 5mg  eliquis  BID, appropriately dosed Patient taking 4mg , recommended he increase to 5mg  BID No bleeding concerns Per previously obtained pharm clearance, ok to hold eliquis  for 3 days prior to procedure   #)HFimpEF NYHA I-II symptoms Warm and  dry on exam, euvolemic GDMT: farxiga , losartan  25, spiro 12.5 Diuretic: 40mg  daily lasix  PRN   Reproductive/Obstetrics                              Anesthesia  Physical Anesthesia Plan  ASA: 3  Anesthesia Plan: General   Post-op Pain Management:    Induction: Intravenous  PONV Risk Score and Plan: 2 and Ondansetron , Dexamethasone  and Treatment may vary due to age or medical condition  Airway Management Planned: Oral ETT  Additional Equipment:   Intra-op Plan:   Post-operative Plan: Extubation in OR  Informed Consent: I have reviewed the patients History and Physical, chart, labs and discussed the procedure including the risks, benefits and alternatives for the proposed anesthesia with the patient or authorized representative who has indicated his/her understanding and acceptance.     Dental advisory given  Plan Discussed with: CRNA  Anesthesia Plan Comments: (Patient consented for risks of anesthesia including but not limited to:  - adverse reactions to medications - damage to eyes, teeth, lips or other oral mucosa - nerve damage due to positioning  - sore throat or hoarseness - damage to heart, brain, nerves, lungs, other parts of body or loss of life  Informed patient about role of CRNA in peri- and intra-operative care.  Patient voiced understanding.)         Anesthesia Quick Evaluation

## 2024-03-31 NOTE — Anesthesia Postprocedure Evaluation (Signed)
 Anesthesia Post Note  Patient: [facility].  Procedure(s) Performed: CYSTOSCOPY (Bladder) URETEROSCOPY (Right: Ureter) BIOPSY, URETER (Right: Ureter)  Patient location during evaluation: PACU Anesthesia Type: General Level of consciousness: awake and alert, oriented and patient cooperative Pain management: pain level controlled Vital Signs Assessment: post-procedure vital signs reviewed and stable Respiratory status: spontaneous breathing, nonlabored ventilation and respiratory function stable Cardiovascular status: blood pressure returned to baseline and stable Postop Assessment: adequate PO intake Anesthetic complications: no   There were no known notable events for this encounter.   Last Vitals:  Vitals:   03/31/24 0953 03/31/24 1000  BP:  (!) 123/92  Pulse: 60 65  Resp: 15 18  Temp:    SpO2: 95% 95%    Last Pain:  Vitals:   03/31/24 1000  TempSrc:   PainSc: 0-No pain                 Dorothey Gate

## 2024-03-31 NOTE — Anesthesia Procedure Notes (Signed)
 Procedure Name: LMA Insertion Date/Time: 03/31/2024 8:52 AM  Performed by: Fedor Kazmierski R, CRNAPre-anesthesia Checklist: Patient identified, Emergency Drugs available, Suction available, Patient being monitored and Timeout performed Patient Re-evaluated:Patient Re-evaluated prior to induction Oxygen Delivery Method: Circle system utilized Preoxygenation: Pre-oxygenation with 100% oxygen Induction Type: IV induction LMA: LMA inserted LMA Size: 4.0 Number of attempts: 1 Placement Confirmation: positive ETCO2 and breath sounds checked- equal and bilateral Tube secured with: Tape Dental Injury: Teeth and Oropharynx as per pre-operative assessment

## 2024-03-31 NOTE — Interval H&P Note (Signed)
 History and Physical Interval Note:  03/31/2024 8:36 AM  Collin [facility].  has presented today for surgery, with the diagnosis of Urothelial Carcinoma.  The various methods of treatment have been discussed with the patient and family. After consideration of risks, benefits and other options for treatment, the patient has consented to  Procedure(s) with comments: CYSTOSCOPY (N/A) URETEROSCOPY (Right) BIOPSY, URETER (Right) - Possible Ablation CYSTOSCOPY, FLEXIBLE, WITH STENT REPLACEMENT (Right) as a surgical intervention.  The patient's history has been reviewed, patient examined, no change in status, stable for surgery.  I have reviewed the patient's chart and labs.  Questions were answered to the patient's satisfaction.     Raj Landress C Staci Dack

## 2024-03-31 NOTE — H&P (Signed)
 Urology H&P  Urologic history: 1.  Urothelial carcinoma bladder Initially seen 05/29/2023 for intermittent gross hematuria x 2-3 years CTU showed a 4.7 x 3.3 enhancing posterior wall bladder mass Cystoscopy under anesthesia 07/09/2023 with 6 cm papillary tumor; s/p TURBT Pathology high-grade T1 urothelial carcinoma Second look/restaging TURBT 08/13/2023.  Papillary tumor surrounding the right ureteral orifice which was resected.  Right ureteroscopy performed which did show tumor extending into the right distal ureter for a length of ~ 5 mm which was resected Ureteral stent was placed Completed induction BCG 10/31/2023 Negative surveillance cystoscopy March 2025 Maintenance BCG x 3 completed: 02/18/2024   Assessment/Recommendations:  78 y.o. male with history of high-grade urothelial carcinoma of the bladder and distal ureter Presents for surveillance cystoscopy/right ureteroscopy with possible bladder/ureteral biopsy, TURBT and laser ablation of ureteral tumor   History of Present Illness: Collin [facility]. is a 78 y.o. with the above urologic history.  He presents for surveillance cystoscopy and surveillance right ureteroscopy.  Past Medical History:  Diagnosis Date   Alcohol use disorder, mild, abuse    10-12 oz wine daily   Anxiety    Aortic atherosclerosis (HCC)    CAD (coronary artery disease) 12/25/2021   a.) cCTA 12/25/2021: Ca2+ 1121 (76th %'ile for age/sex/race match control); mild oOM1, mod RCA and p-mLAD disease   Chronic atrial fibrillation (HCC)    a.) CHA2DS2-VASc = 5 (age x2, CHF, HTN, vascular disease history) as of 08/08/2023; b.) cardiac rate/rhythm maintained on oral amiodardone; chronically anticoagulated using apixaban    Chronic systolic CHF (congestive heart failure) (HCC)    a.) TTE 09/29/2020: EF 50-55%, no RWMAs, mild MR; b.) TTE 11/22/2021: EF 20-25%, mild RVE with reduced RVSF, mild LAE, mod RAE; c.) TTE 12/14/2022: EF 25-30%, glob HK, mild LVH, mod  MR, mild-mod TR, mild AR, Ao root 39mm; d.) TTE 04/30/2023: EF 50-55%, mild MR, mild late systolic prolapse PLMV   COPD (chronic obstructive pulmonary disease) (HCC)    Depression    Dyspnea    Frequent PVCs    Hematuria    Hemorrhoids    History of bilateral cataract extraction 2018   HLD (hyperlipidemia)    HOH (hard of hearing) - uses BILATERAL hearing aids    HTN (hypertension)    Long term current use of amiodarone     Lymphocytic colitis    MVP (mitral valve prolapse) 04/30/2023   a.) TTE 04/30/2023: mild late systolic prolapse PLMV   On apixaban  therapy    OSA (obstructive sleep apnea)    a.) no nocturnal PAP therapy; mild disease with AHI 5.8/hr   Pacing-induced cardiomyopathy (HCC)    a.) symptomatic bradycardia with RV pacing inducing cardiomypathy s/p Micra AV leadless PPM place --> device removed and dual chamber MDT XT DR MRI Surescan device placed 02/19/2023   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   a.) SA node dysfunction --> s/p Micra AV leadless PPM placement 09/30/2020; b.) developed symptomatic bradycardia with pacing induced cardiomyopathy --> Micra device explanted and new MDT Azure XT DR MRI SureScan (SN: [id number]) pdevice placed   Psoriasis    PSVT (paroxysmal supraventricular tachycardia) (HCC) 10/31/2022   a.) Zio 10/31/2022: 17 runs PSVT with longest last 1 min 35 secs and fastestest lasting 29.7 secs at a rate of 148 bpm   Recurrent syncope    Sinus pause    SSS (sick sinus syndrome) (HCC)    a.) s/p PPM placement   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(HCC)    a.) CT  hematuria 06/06/2023: 4.7 cm RIGHT posterior bladder mass --> s/p TURBT 07/09/2023 --> pathology (+) for high grade urothelial carcinoma with (+) invasion into the lamina propria    Past Surgical History:  Procedure Laterality Date   BIV PACEMAKER INSERTION CRT-P N/A 02/19/2023   Procedure: BIV PACEMAKER INSERTION CRT-P;  Surgeon: Boyce Byes, MD;  Location: [facility];  Service: Cardiovascular;  Laterality: N/A;   CATARACT EXTRACTION Bilateral 2018   COLONOSCOPY WITH PROPOFOL  N/A 09/25/2021   Procedure: COLONOSCOPY WITH PROPOFOL ;  Surgeon: Quintin Buckle, DO;  Location: [facility] ENDOSCOPY;  Service: Gastroenterology;  Laterality: N/A;   CYSTOSCOPY W/ URETERAL STENT PLACEMENT Right 08/13/2023   Procedure: CYSTOSCOPY WITH RETROGRADE PYELOGRAM/URETERAL STENT PLACEMENT;  Surgeon: Geraline Knapp, MD;  Location: [facility] ORS;  Service: Urology;  Laterality: Right;   CYSTOSCOPY WITH BIOPSY N/A 08/13/2023   Procedure: CYSTOSCOPY WITH BLADDER BIOPSY;  Surgeon: Geraline Knapp, MD;  Location: [facility] ORS;  Service: Urology;  Laterality: N/A;   PACEMAKER LEADLESS INSERTION N/A 09/30/2020   Procedure: PACEMAKER LEADLESS INSERTION;  Surgeon: Percival Brace, MD;  Location: [facility] IN[facility]ASIVE CV LAB;  Service: Cardiovascular;  Laterality: N/A;   TRANSURETHRAL RESECTION OF BLADDER TUMOR N/A 07/09/2023   Procedure: TRANSURETHRAL RESECTION OF BLADDER TUMOR (TURBT);  Surgeon: Geraline Knapp, MD;  Location: [facility] ORS;  Service: Urology;  Laterality: N/A;   TRANSURETHRAL RESECTION OF BLADDER TUMOR N/A 08/13/2023   Procedure: TRANSURETHRAL RESECTION OF BLADDER TUMOR (TURBT);  Surgeon: Geraline Knapp, MD;  Location: [facility] ORS;  Service: Urology;  Laterality: N/A;    Home Medications:  Current Meds  Medication Sig   apixaban  (ELIQUIS ) 5 MG TABS tablet Take 5 mg by mouth 2 (two) times daily.   Cholecalciferol (VITAMIN D3 MAXIMUM STRENGTH) 125 MCG (5000 UT) capsule Take 5,000 Units by mouth daily.   dapagliflozin  propanediol (FARXIGA ) 10 MG TABS tablet Take 1 tablet (10 mg total) by mouth daily before breakfast.   furosemide  (LASIX ) 40 MG tablet Take 1 tablet (40 mg total) by mouth as needed.   losartan  (COZAAR ) 25 MG tablet Take 2 tablets (50 mg total) by mouth daily. (Patient taking differently: Take 50 mg by mouth every morning.)   Multiple Vitamin (MULTIVITAMIN WITH  MINERALS) TABS tablet Take 1 tablet by mouth daily.   sertraline  (ZOLOFT ) 25 MG tablet Take 1 tablet by mouth daily.   solifenacin  (VESICARE ) 10 MG tablet Take 10 mg by mouth daily.   vitamin B-12 (CYANOCOBALAMIN) 500 MCG tablet Take 500 mcg by mouth daily.   [DISCONTINUED] amiodarone  (PACERONE ) 100 MG tablet TAKE 1 TABLET BY MOUTH EVERY DAY (Patient taking differently: Take 100 mg by mouth every morning.)   [DISCONTINUED] spironolactone  (ALDACTONE ) 25 MG tablet Take 0.5 tablets (12.5 mg total) by mouth daily. (Patient taking differently: Take 12.5 mg by mouth every morning.)    Allergies: No Known Allergies  Family History  Problem Relation Age of Onset   Stroke Mother    Cancer - Lung Father     Social History:  reports that he has quit smoking. His smoking use included cigarettes. He has never used smokeless tobacco. He reports current alcohol use of about 21.0 standard drinks of alcohol per week. He reports that he does not use drugs.  ROS: No chest pain, shortness of breath, fever, chills  Physical Exam:  Vital signs in last 24 hours:   Constitutional:  Alert and oriented, No acute distress HEENT: [place] AT Cardiovascular: Regular rate and rhythm Respiratory: Normal respiratory effort,  lungs clear bilaterally Psychiatric: Normal mood and affect   Laboratory Data:  No results for input(s): "WBC", "HGB", "HCT" in the last 72 hours. No results for input(s): "NA", "K", "CL", "CO2", "GLUCOSE", "BUN", "CREATININE", "CALCIUM" in the last 72 hours. No results for input(s): "LABPT", "INR" in the last 72 hours. No results for input(s): "LABURIN" in the last 72 hours. Results for orders placed or performed in visit on 03/20/24  CULTURE, URINE COMPREHENSIVE     Status: None   Collection Time: 03/20/24 11:09 AM   Specimen: Urine   UR  Result Value Ref Range Status   Urine Culture, Comprehensive Final report  Final   Organism ID, Bacteria Comment  Final    Comment: No growth in 36 - 48  hours.  Microscopic Examination     Status: None   Collection Time: 03/20/24 11:09 AM   Urine  Result Value Ref Range Status   WBC, UA 0-5 0 - 5 /hpf Final   RBC, Urine 0-2 0 - 2 /hpf Final   Epithelial Cells (non renal) 0-10 0 - 10 /hpf Final   Bacteria, UA Few None seen/Few Final     03/31/2024, 7:17 AM  Darlynn Elam,  MD

## 2024-03-31 NOTE — Transfer of Care (Signed)
 Immediate Anesthesia Transfer of Care Note  Patient: Col[facility].  Procedure(s) Performed: CYSTOSCOPY (Bladder) URETEROSCOPY (Right: Ureter) BIOPSY, URETER (Right: Ureter)  Patient Location: PACU  Anesthesia Type:General  Level of Consciousness: sedated  Airway & Oxygen Therapy: Patient Spontanous Breathing  Post-op Assessment: Report given to RN and Post -op Vital signs reviewed and stable  Post vital signs: Reviewed and stable  Last Vitals:  Vitals Value Taken Time  BP 118/81 03/31/24 0937  Temp    Pulse 68 03/31/24 0940  Resp 15 03/31/24 0940  SpO2 96 % 03/31/24 0940  Vitals shown include unfiled device data.  Last Pain:  Vitals:   03/31/24 0802  TempSrc: Temporal  PainSc: 0-No pain         Complications: There were no known notable events for this encounter.

## 2024-04-01 ENCOUNTER — Encounter: Payer: Self-pay | Admitting: Urology

## 2024-04-01 LAB — SURGICAL PATHOLOGY

## 2024-04-02 ENCOUNTER — Ambulatory Visit: Payer: Self-pay | Admitting: Urology

## 2024-04-14 NOTE — Progress Notes (Signed)
 Remote pacemaker transmission.

## 2024-05-04 ENCOUNTER — Encounter: Payer: Self-pay | Admitting: Dermatology

## 2024-05-04 ENCOUNTER — Ambulatory Visit (INDEPENDENT_AMBULATORY_CARE_PROVIDER_SITE_OTHER): Admitting: Dermatology

## 2024-05-04 DIAGNOSIS — L820 Inflamed seborrheic keratosis: Secondary | ICD-10-CM

## 2024-05-04 DIAGNOSIS — R21 Rash and other nonspecific skin eruption: Secondary | ICD-10-CM

## 2024-05-04 DIAGNOSIS — L739 Follicular disorder, unspecified: Secondary | ICD-10-CM

## 2024-05-04 DIAGNOSIS — B079 Viral wart, unspecified: Secondary | ICD-10-CM | POA: Diagnosis not present

## 2024-05-04 DIAGNOSIS — L821 Other seborrheic keratosis: Secondary | ICD-10-CM | POA: Diagnosis not present

## 2024-05-04 DIAGNOSIS — L817 Pigmented purpuric dermatosis: Secondary | ICD-10-CM

## 2024-05-04 DIAGNOSIS — D1801 Hemangioma of skin and subcutaneous tissue: Secondary | ICD-10-CM

## 2024-05-04 MED ORDER — CLINDAMYCIN PHOSPHATE 1 % EX LOTN: TOPICAL_LOTION | CUTANEOUS | 11 refills | Status: DC

## 2024-05-04 NOTE — Patient Instructions (Addendum)
 Recommend over the counter Hibiclens  wash daily in the shower where bumps occur.    Due to recent changes in healthcare laws, you may see results of your pathology and/or laboratory studies on MyChart before the doctors have had a chance to review them. We understand that in some cases there may be results that are confusing or concerning to you. Please understand that not all results are received at the same time and often the doctors may need to interpret multiple results in order to provide you with the best plan of care or course of treatment. Therefore, we ask that you please give us  2 business days to thoroughly review all your results before contacting the office for clarification. Should we see a critical lab result, you will be contacted sooner.   If You Need Anything After Your Visit  If you have any questions or concerns for your doctor, please call our main line at [phone] and press option 4 to reach your doctor's medical assistant. If no one answers, please leave a voicemail as directed and we will return your call as soon as possible. Messages left after 4 pm will be answered the following business day.   You may also send us  a message via MyChart. We typically respond to MyChart messages within 1-2 business days.  For prescription refills, please ask your pharmacy to contact our office. Our fax number is [phone].  If you have an urgent issue when the clinic is closed that cannot wait until the next business day, you can page your doctor at the number below.    Please note that while we do our best to be available for urgent issues outside of office hours, we are not available 24/7.   If you have an urgent issue and are unable to reach us , you may choose to seek medical care at your doctor's office, retail clinic, urgent care center, or emergency room.  If you have a medical emergency, please immediately call 911 or go to the emergency department.  Pager Numbers  -  Dr. Hester: [phone]  - Dr. Jackquline: [phone]  - Dr. Claudene: [phone]   In the event of inclement weather, please call our main line at [phone] for an update on the status of any delays or closures.  Dermatology Medication Tips: Please keep the boxes that topical medications come in in order to help keep track of the instructions about where and how to use these. Pharmacies typically print the medication instructions only on the boxes and not directly on the medication tubes.   If your medication is too expensive, please contact our office at [phone] option 4 or send us  a message through MyChart.   We are unable to tell what your co-pay for medications will be in advance as this is different depending on your insurance coverage. However, we may be able to find a substitute medication at lower cost or fill out paperwork to get insurance to cover a needed medication.   If a prior authorization is required to get your medication covered by your insurance company, please allow us  1-2 business days to complete this process.  Drug prices often vary depending on where the prescription is filled and some pharmacies may offer cheaper prices.  The website www.goodrx.com contains coupons for medications through different pharmacies. The prices here do not account for what the cost may be with help from insurance (it may be cheaper with your insurance), but the website can give you the price if  you did not use any insurance.  - You can print the associated coupon and take it with your prescription to the pharmacy.  - You may also stop by our office during regular business hours and pick up a GoodRx coupon card.  - If you need your prescription sent electronically to a different pharmacy, notify our office through [facility] or by phone at [phone] option 4.     Si Usted Necesita Algo Despus de Su Visita  Tambin puede enviarnos un mensaje a travs de Clinical cytogeneticist. Por  lo general respondemos a los mensajes de MyChart en el transcurso de 1 a 2 das hbiles.  Para renovar recetas, por favor pida a su farmacia que se ponga en contacto con nuestra oficina. Randi lakes de fax es [place] [phone].  Si tiene un asunto urgente cuando la clnica est cerrada y que no puede esperar hasta el siguiente da hbil, puede llamar/localizar a su doctor(a) al nmero que aparece a continuacin.   Por favor, tenga en cuenta que aunque hacemos todo lo posible para estar disponibles para asuntos urgentes fuera del horario de [place], no estamos disponibles las 24 horas del da, los 7 [address]  [address] de la [place].   Si tiene un problema urgente y no puede comunicarse con nosotros, puede optar por buscar atencin mdica  en el consultorio de su doctor(a), en una clnica privada, en un centro de atencin urgente o en una sala de emergencias.  Si tiene Engineer, drilling, por favor llame inmediatamente al 911 o vaya a la sala de emergencias.  Nmeros de bper  - Dr. Hester: [phone]  - Dra. Jackquline: [phone]  - Dr. Claudene: [phone]   En caso de inclemencias del tiempo, por favor llame a landry capes principal al [phone] para una actualizacin sobre el [place] de cualquier retraso o cierre.  Consejos para la medicacin en dermatologa: Por favor, guarde las cajas en las que vienen los medicamentos de uso tpico para ayudarle a seguir las instrucciones sobre dnde y cmo usarlos. Las farmacias generalmente imprimen las instrucciones del medicamento slo en las cajas y no directamente en los tubos del [place].   Si su medicamento es muy caro, por favor, pngase en contacto con landry rieger llamando al [phone] y presione la opcin 4 o envenos un mensaje a travs de Clinical cytogeneticist.   No podemos decirle cul ser su copago por los medicamentos por adelantado ya que esto es diferente dependiendo de la cobertura de su seguro. Sin embargo, es posible que podamos  encontrar un medicamento sustituto a Audiological scientist un formulario para que el seguro cubra el medicamento que se considera necesario.   Si se requiere una autorizacin previa para que su compaa de seguros malta su medicamento, por favor permtanos de 1 a 2 das hbiles para completar este proceso.  Los precios de los medicamentos varan con frecuencia dependiendo del Environmental consultant de dnde se surte la receta y alguna farmacias pueden ofrecer precios ms baratos.  El sitio web www.goodrx.com tiene cupones para medicamentos de Health and safety inspector. Los precios aqu no tienen en cuenta lo que podra costar con la ayuda del seguro (puede ser ms barato con su seguro), pero el sitio web puede darle el precio si no utiliz Tourist information centre manager.  - Puede imprimir el cupn correspondiente y llevarlo con su receta a la farmacia.  - Tambin puede pasar por nuestra oficina durante el horario de atencin regular y Education officer, museum una tarjeta de cupones de GoodRx.  - Si necesita que su  receta se enve electrnicamente a una farmacia diferente, informe a nuestra oficina a travs de MyChart de [place] o por telfono llamando al [phone] y presione la opcin 4.

## 2024-05-04 NOTE — Progress Notes (Deleted)
   Follow-Up Visit   Subjective  [facility] Collin Knight. is a 78 y.o. male who presents for the following: Pt c/o irregular skin lesions on the lower legs for a few days, and a possible wart on the L knee. He also has lesions on the chest and scalp that he would like evaluated today.   The following portions of the chart were reviewed this encounter and updated as appropriate: medications, allergies, medical history  Review of Systems:  No other skin or systemic complaints except as noted in HPI or Assessment and Plan.  Objective  Well appearing patient in no apparent distress; mood and affect are within normal limits.  ***A full examination was performed including scalp, head, eyes, ears, nose, lips, neck, chest, axillae, abdomen, back, buttocks, bilateral upper extremities, bilateral lower extremities, hands, feet, fingers, toes, fingernails, and toenails. All findings within normal limits unless otherwise noted below.  ***A focused examination was performed of the following areas: ***  Relevant exam findings are noted in the Assessment and Plan.    Assessment & Plan       No follow-ups on file.  LILLETTE Rosina Mayans, CMA, am acting as scribe for Boneta Sharps, MD .   Documentation: I have reviewed the above documentation for accuracy and completeness, and I agree with the above.  Boneta Sharps, MD

## 2024-05-04 NOTE — Progress Notes (Signed)
 New Patient Visit   Subjective  [facility] Collin Knight. is a 78 y.o. male who presents for the following: Pt c/o irregular skin lesions on the lower legs for a few days, and a possible wart on the R knee. He also has lesions on the chest and scalp that he would like evaluated today. No hx of skin cancer or dysplastic nevi.  The following portions of the chart were reviewed this encounter and updated as appropriate: medications, allergies, medical history  Review of Systems:  No other skin or systemic complaints except as noted in HPI or Assessment and Plan.  Objective  Well appearing patient in no apparent distress; mood and affect are within normal limits.   A focused examination was performed of the following areas: the face, trunk, extremities, scalp   Relevant exam findings are noted in the Assessment and Plan.  R knee x 1 Verrucous papules -- Discussed viral etiology and contagion.  L chest x 1 , L parietal scalp x 1, (2) Erythematous stuck-on, waxy papule or plaque  Assessment & Plan   SEBORRHEIC KERATOSIS - Stuck-on, waxy, tan-brown papules and/or plaques  - Benign-appearing - Discussed benign etiology and prognosis. - Observe - Call for any changes  FOLLICULITIS Exam: follicular erythematous papules of scalp/neck  Folliculitis occurs due to inflammation of the superficial hair follicle (pore), resulting in acne-like lesions (pus bumps). It can be infectious (bacterial, fungal) or noninfectious (shaving, tight clothing, heat/sweat, medications).  Folliculitis can be acute or chronic and recommended treatment depends on the underlying cause of folliculitis.  Treatment Plan: Recommend Hibiclens  wash daily in the shower. Start Clindamycin  lotion to aa's BID.  Consider oral antibiotics if topical treatment not effective.  VIRAL WARTS, UNSPECIFIED TYPE R knee x 1 Viral Wart (HPV) Counseling vs ISK Discussed viral / HPV (Human Papilloma Virus) etiology and risk of  spread /infectivity to other areas of body as well as to other people.  Multiple treatments and methods may be required to clear warts and it is possible treatment may not be successful.  Treatment risks include discoloration; scarring and there is still potential for wart recurrence.  Destruction of lesion - R knee x 1 Complexity: simple   Destruction method: cryotherapy   Informed consent: discussed and consent obtained   Timeout:  patient name, date of birth, surgical site, and procedure verified Lesion destroyed using liquid nitrogen: Yes   Region frozen until ice ball extended beyond lesion: Yes   Outcome: patient tolerated procedure well with no complications   Post-procedure details: wound care instructions given    INFLAMED SEBORRHEIC KERATOSIS (2) L chest x 1 , L parietal scalp x 1, (2) Symptomatic, irritating, patient would like treated.  Destruction of lesion - L chest x 1 , L parietal scalp x 1, (2) Complexity: simple   Destruction method: cryotherapy   Informed consent: discussed and consent obtained   Timeout:  patient name, date of birth, surgical site, and procedure verified Lesion destroyed using liquid nitrogen: Yes   Region frozen until ice ball extended beyond lesion: Yes   Outcome: patient tolerated procedure well with no complications   Post-procedure details: wound care instructions given    FOLLICULITIS   Related Medications clindamycin  (CLEOCIN -T) 1 % lotion Apply to aa's bumps BID. PIGMENTED PURPURIC DERMATOSIS   SEBORRHEIC KERATOSES   Rash - insect bite reaction vs capillaritis  Exam: Red macules on b/l lower legs and two red patches on L lower leg  Treatment Plan: Discussed topical steroid  cream, pt states that he already has some at home and he will try that.  HEMANGIOMA Exam: red papule(s) Discussed benign nature. Recommend observation. Call for changes.  Return if symptoms worsen or fail to improve.  LILLETTE Rosina Mayans, CMA, am acting as  scribe for Boneta Sharps, MD .   Documentation: I have reviewed the above documentation for accuracy and completeness, and I agree with the above.  Boneta Sharps, MD

## 2024-06-03 ENCOUNTER — Ambulatory Visit: Payer: Medicare Other

## 2024-06-03 DIAGNOSIS — I495 Sick sinus syndrome: Secondary | ICD-10-CM | POA: Diagnosis not present

## 2024-06-04 ENCOUNTER — Ambulatory Visit: Payer: Self-pay | Admitting: Cardiology

## 2024-06-04 LAB — CUP PACEART REMOTE DEVICE CHECK
Battery Remaining Longevity: 147 mo
Battery Voltage: 3.05 V
Brady Statistic AP VP Percent: 0.04 %
Brady Statistic AP VS Percent: 99.11 %
Brady Statistic AS VP Percent: 0 %
Brady Statistic AS VS Percent: 0.85 %
Brady Statistic RA Percent Paced: 99.38 %
Brady Statistic RV Percent Paced: 0.04 %
Date Time Interrogation Session: 20250812201822
Implantable Lead Connection Status: 753985
Implantable Lead Connection Status: 753985
Implantable Lead Implant Date: 20240430
Implantable Lead Implant Date: 20240430
Implantable Lead Location: 753859
Implantable Lead Location: 753860
Implantable Lead Model: 3830
Implantable Lead Model: 5076
Implantable Pulse Generator Implant Date: 20240430
Lead Channel Impedance Value: 342 Ohm
Lead Channel Impedance Value: 342 Ohm
Lead Channel Impedance Value: 437 Ohm
Lead Channel Impedance Value: 513 Ohm
Lead Channel Pacing Threshold Amplitude: 0.5 V
Lead Channel Pacing Threshold Amplitude: 1.125 V
Lead Channel Pacing Threshold Pulse Width: 0.4 ms
Lead Channel Pacing Threshold Pulse Width: 0.4 ms
Lead Channel Sensing Intrinsic Amplitude: 0.875 mV
Lead Channel Sensing Intrinsic Amplitude: 0.875 mV
Lead Channel Sensing Intrinsic Amplitude: 28 mV
Lead Channel Sensing Intrinsic Amplitude: 28 mV
Lead Channel Setting Pacing Amplitude: 1.5 V
Lead Channel Setting Pacing Amplitude: 2.25 V
Lead Channel Setting Pacing Pulse Width: 0.4 ms
Lead Channel Setting Sensing Sensitivity: 0.9 mV
Zone Setting Status: 755011

## 2024-06-29 ENCOUNTER — Other Ambulatory Visit: Payer: Self-pay | Admitting: Internal Medicine

## 2024-06-30 ENCOUNTER — Encounter: Payer: Self-pay | Admitting: Urology

## 2024-07-06 ENCOUNTER — Other Ambulatory Visit: Admitting: Urology

## 2024-07-16 NOTE — Progress Notes (Signed)
 Remote PPM Transmission

## 2024-07-20 NOTE — Progress Notes (Unsigned)
 ADVANCED HF CLINIC CONSULT NOTE  Referring Provider: Ellouise Class, NP  Primary Care: Valora Agent, MD Primary Cardiologist: Maryl GLENWOOD Dooms  HPI:  Collin Goin. is a 78 y.o. male with PAF, lymphocytic colitis, SSS (s/p of Micra leadless pacemaker in 12/21), alcohol abuse, depression/anxiety and chronic systolic heart failure.   Referred by Collin Class NP for further evaluation of his HF  Admitted in 1/23 with CP and SOB. Found to be in AF with RVR. Echo 11/22/21 EF 55%-> 20-25%. Started on amiodarone .    Coronary CTA was performed 12/25/2021 with nonobstructive CAD: moderate RCA stenosis FFR 0.85, moderate proximal mid LAD stenosis FFR 0.84   Has been on amiodarone  for rhythm control   Seen by Ms. Grady Mohabir in the HF Clinic in 12/23. Started Entresto . Says it made him feel terrible. He got blurry-eyed and lightheaded and fatigued.   I saw him in 12/23. Felt EF down to previous AF with RVR or possible RV pacing. When I saw him HR was in 50s with 75% RV pacing so Toprol  stopped.   Echo 2/24 EF 25-30%   PPM interrogation 12/23 75% RV pacing  Discussed referral to EP with Dr. Dooms at last visit for upgraded to BiV pacing system.   Underwent dual chamber PPM with LBBB area lead on 02/19/23  Echo 04/30/23 EF 50-55% mild MR   Here with his wife. Says he feels better. But he reports that he remains tired. No CP or SOB. No edema, orthopnea or PND.  PPM interrogation AP (with intact AV conduction) 98.9% No AF   Past Medical History:  Diagnosis Date   Alcohol use disorder, mild, abuse    10-12 oz wine daily   Anxiety    Aortic atherosclerosis    CAD (coronary artery disease) 12/25/2021   a.) cCTA 12/25/2021: Ca2+ 1121 (76th %'ile for age/sex/race match control); mild oOM1, mod RCA and p-mLAD disease   Chronic atrial fibrillation (HCC)    a.) CHA2DS2-VASc = 5 (age x2, CHF, HTN, vascular disease history) as of 08/08/2023; b.) cardiac rate/rhythm maintained on oral  amiodardone; chronically anticoagulated using apixaban    Chronic systolic CHF (congestive heart failure) (HCC)    a.) TTE 09/29/2020: EF 50-55%, no RWMAs, mild MR; b.) TTE 11/22/2021: EF 20-25%, mild RVE with reduced RVSF, mild LAE, mod RAE; c.) TTE 12/14/2022: EF 25-30%, glob HK, mild LVH, mod MR, mild-mod TR, mild AR, Ao root 39mm; d.) TTE 04/30/2023: EF 50-55%, mild MR, mild late systolic prolapse PLMV   COPD (chronic obstructive pulmonary disease) (HCC)    Depression    Dyspnea    Frequent PVCs    Hematuria    Hemorrhoids    History of bilateral cataract extraction 2018   HLD (hyperlipidemia)    HOH (hard of hearing) - uses BILATERAL hearing aids    HTN (hypertension)    Long term current use of amiodarone     Lymphocytic colitis    MVP (mitral valve prolapse) 04/30/2023   a.) TTE 04/30/2023: mild late systolic prolapse PLMV   On apixaban  therapy    OSA (obstructive sleep apnea)    a.) no nocturnal PAP therapy; mild disease with AHI 5.8/hr   Pacing-induced cardiomyopathy (HCC)    a.) symptomatic bradycardia with RV pacing inducing cardiomypathy s/p Micra AV leadless PPM place --> device removed and dual chamber MDT XT DR MRI Surescan device placed 02/19/2023   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   a.) SA node dysfunction --> s/p Micra AV leadless PPM  placement 09/30/2020; b.) developed symptomatic bradycardia with pacing induced cardiomyopathy --> Micra device explanted and new MDT Azure XT DR MRI SureScan (SN: [id number]) pdevice placed   Psoriasis    PSVT (paroxysmal supraventricular tachycardia) 10/31/2022   a.) Zio 10/31/2022: 17 runs PSVT with longest last 1 min 35 secs and fastestest lasting 29.7 secs at a rate of 148 bpm   Recurrent syncope    Sinus pause    SSS (sick sinus syndrome) (HCC)    a.) s/p PPM placement   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(HCC)    a.) CT hematuria 06/06/2023: 4.7 cm RIGHT posterior bladder mass --> s/p TURBT 07/09/2023 --> pathology  (+) for high grade urothelial carcinoma with (+) invasion into the lamina propria    Current Outpatient Medications  Medication Sig Dispense Refill   acetaminophen  (TYLENOL ) 500 MG tablet Take 1,000 mg by mouth every 6 (six) hours as needed for mild pain (pain score 1-3).     amiodarone  (PACERONE ) 100 MG tablet TAKE 1 TABLET BY MOUTH EVERY DAY 90 tablet 3   apixaban  (ELIQUIS ) 5 MG TABS tablet Take 5 mg by mouth 2 (two) times daily. (Patient not taking: Reported on 05/04/2024)     Cholecalciferol (VITAMIN D3 MAXIMUM STRENGTH) 125 MCG (5000 UT) capsule Take 5,000 Units by mouth daily.     clindamycin  (CLEOCIN -T) 1 % lotion Apply to aa's bumps BID. 60 mL 11   dapagliflozin  propanediol (FARXIGA ) 10 MG TABS tablet Take 1 tablet (10 mg total) by mouth daily before breakfast. 90 tablet 3   furosemide  (LASIX ) 40 MG tablet Take 1 tablet (40 mg total) by mouth as needed.     losartan  (COZAAR ) 25 MG tablet TAKE 2 TABLETS BY MOUTH EVERY DAY 180 tablet 3   Multiple Vitamin (MULTIVITAMIN WITH MINERALS) TABS tablet Take 1 tablet by mouth daily.     sertraline  (ZOLOFT ) 25 MG tablet Take 1 tablet by mouth daily.     solifenacin  (VESICARE ) 10 MG tablet Take 10 mg by mouth daily.     spironolactone  (ALDACTONE ) 25 MG tablet TAKE 1/2 TABLET BY MOUTH EVERY DAY 45 tablet 3   vitamin B-12 (CYANOCOBALAMIN) 500 MCG tablet Take 500 mcg by mouth daily.     No current facility-administered medications for this visit.    No Known Allergies    Social History   Socioeconomic History   Marital status: Married    Spouse name: Alicea,Janet (Spouse)   Number of children: Not on file   Years of education: Not on file   Highest education level: Not on file  Occupational History   Not on file  Tobacco Use   Smoking status: Former    Types: Cigarettes   Smokeless tobacco: Never  Vaping Use   Vaping status: Never Used  Substance and Sexual Activity   Alcohol use: Yes    Alcohol/week: 21.0 standard drinks of alcohol     Types: 21 Cans of beer per week    Comment: 10-12 oz wine per day   Drug use: Never   Sexual activity: Yes  Other Topics Concern   Not on file  Social History Narrative   Not on file   Social Drivers of Health   Financial Resource Strain: Low Risk  (10/09/2023)   Received from [facility]   Overall Financial Resource Strain (CARDIA)    Difficulty of Paying Living Expenses: Not very hard  Food Insecurity: No Food Insecurity (10/09/2023)   Received from [facility]  Hunger Vital Sign    Within the past 12 months, you worried that your food would run out before you got the money to buy more.: Never true    Within the past 12 months, the food you bought just didn't last and you didn't have money to get more.: Never true  Transportation Needs: No Transportation Needs (10/09/2023)   Received from [facility] - Transportation    In the past 12 months, has lack of transportation kept you from medical appointments or from getting medications?: No    Lack of Transportation (Non-Medical): No  Physical Activity: Not on file  Stress: Not on file  Social Connections: Not on file  Intimate Partner Violence: Not on file      Family History  Problem Relation Age of Onset   Stroke Mother    Cancer - Lung Father     There were no vitals filed for this visit.   PHYSICAL EXAM: General:  Well appearing. No resp difficulty HEENT: normal Neck: supple. no JVD. Carotids 2+ bilat; no bruits. No lymphadenopathy or thryomegaly appreciated. Cor: PMI nondisplaced. Regular rate & rhythm. No rubs, gallops or murmurs. Lungs: clear Abdomen: soft, nontender, nondistended. No hepatosplenomegaly. No bruits or masses. Good bowel sounds. Extremities: no cyanosis, clubbing, rash, edema Neuro: alert & orientedx3, cranial nerves grossly intact. moves all 4 extremities w/o difficulty. Affect pleasant  ASSESSMENT & PLAN:   1. Chronic HFrEF   - Echo 2/23 EF 55% -> 20-25% in setting of AF with RVR - Coronary CT 3/23 non-obstructive CAD - Much improved with CRT-P NYHA I-II - Echo 04/30/23 EF 50-55% mild MR  - Unable to tolerate Entresto . Increase losartan  25 -> 50 daily - Toprol  stopped 12/23 with bradycardia and frequent RV pacing  - Continue Farxiga  10 - Continue spiro 12.5 labs - I saw him in 12/23. Felt EF initially down (1/23) due to AF with RVR. When I saw him in 12/23 HR was in 50s with 75% RV pacing so Toprol  stopped and echo repeated - Echo 2/24 EF remained 25-30%  - Suspect RV pacing CM (with Micra device in place)  - Underwent CRT-P with dual chamber PPM with LBBB lead on 02/19/23. Clinically much improved and EF has normalized. Doubt fatigue is related to HF - F/u with Dr. Ammon.   2. PAF  - Remains in NSR on amio 100  - He will follow Dr. Ammon -Con tinue Eliquis  5 bid  3. SSS - s/p Micra leadless pacer with subsequent upgrade to CRT-P - Now s/p CRT-P. Followed by Dr. Cindie. - PPM interrogation AP (with intact AV conduction) 98.9% No AF - done personally in clinic  4. ETOH abuse  - has cut back - asked him to discuss further with PCP regarding counseling and possible SSRI/SSNI (watching QT carefully)  5. Fatigue - check TFTs and sleep study  Collin DELENA Class, FNP  3:43 PM

## 2024-07-21 ENCOUNTER — Encounter: Payer: Self-pay | Admitting: Family

## 2024-07-21 ENCOUNTER — Telehealth: Payer: Self-pay

## 2024-07-21 ENCOUNTER — Other Ambulatory Visit (HOSPITAL_COMMUNITY): Payer: Self-pay

## 2024-07-21 ENCOUNTER — Ambulatory Visit: Attending: Family | Admitting: Family

## 2024-07-21 VITALS — BP 114/72 | HR 64 | Wt 165.8 lb

## 2024-07-21 DIAGNOSIS — Z7984 Long term (current) use of oral hypoglycemic drugs: Secondary | ICD-10-CM | POA: Diagnosis not present

## 2024-07-21 DIAGNOSIS — I110 Hypertensive heart disease with heart failure: Secondary | ICD-10-CM | POA: Insufficient documentation

## 2024-07-21 DIAGNOSIS — F419 Anxiety disorder, unspecified: Secondary | ICD-10-CM | POA: Insufficient documentation

## 2024-07-21 DIAGNOSIS — Z7901 Long term (current) use of anticoagulants: Secondary | ICD-10-CM | POA: Diagnosis not present

## 2024-07-21 DIAGNOSIS — I480 Paroxysmal atrial fibrillation: Secondary | ICD-10-CM | POA: Insufficient documentation

## 2024-07-21 DIAGNOSIS — Z79899 Other long term (current) drug therapy: Secondary | ICD-10-CM | POA: Diagnosis not present

## 2024-07-21 DIAGNOSIS — R002 Palpitations: Secondary | ICD-10-CM | POA: Diagnosis not present

## 2024-07-21 DIAGNOSIS — I5032 Chronic diastolic (congestive) heart failure: Secondary | ICD-10-CM | POA: Diagnosis present

## 2024-07-21 DIAGNOSIS — I495 Sick sinus syndrome: Secondary | ICD-10-CM | POA: Diagnosis not present

## 2024-07-21 DIAGNOSIS — F1010 Alcohol abuse, uncomplicated: Secondary | ICD-10-CM | POA: Insufficient documentation

## 2024-07-21 DIAGNOSIS — I2510 Atherosclerotic heart disease of native coronary artery without angina pectoris: Secondary | ICD-10-CM | POA: Insufficient documentation

## 2024-07-21 DIAGNOSIS — I4819 Other persistent atrial fibrillation: Secondary | ICD-10-CM

## 2024-07-21 DIAGNOSIS — F32A Depression, unspecified: Secondary | ICD-10-CM | POA: Diagnosis not present

## 2024-07-21 DIAGNOSIS — I5020 Unspecified systolic (congestive) heart failure: Secondary | ICD-10-CM

## 2024-07-21 NOTE — Patient Instructions (Addendum)
 It was good to see you today!  If you receive a satisfaction survey regarding the Heart Failure Clinic, please take the time to fill it out. This way we can continue to provide excellent care and make any changes that need to be made.   Medication Changes:  No medication changes today!  Samples of eliquis  and farxiga  have been given  Lab Work:  Go downstairs to [place] on LOWER LEVEL to have your blood work completed.  We will only call you if the results are abnormal or if the provider would like to make medication changes.  No news is good news.   Follow-Up in: Please follow up with the Advanced Heart Failure Clinic in November with Dr. Cherrie.    Thank you for choosing [facility].    At the Advanced Heart Failure Clinic, you and your health needs are our priority. We have a designated team specialized in the treatment of Heart Failure. This Care Team includes your primary Heart Failure Specialized Cardiologist (physician), Advanced Practice Providers (APPs- Physician Assistants and Nurse Practitioners), and Pharmacist who all work together to provide you with the care you need, when you need it.   You may see any of the following providers on your designated Care Team at your next follow up:  Dr. Toribio Cherrie Dr. Ezra Shuck Dr. Ria Commander Dr. Morene Brownie Ellouise Class, FNP Jaun Bash, RPH-CPP  Please be sure to bring in all your medications bottles to every appointment.   Need to Contact Us :  If you have any questions or concerns before your next appointment please send us  a message through [place] or call our office at [phone].    TO LEAVE A MESSAGE FOR THE NURSE SELECT OPTION 2, PLEASE LEAVE A MESSAGE INCLUDING: YOUR NAME DATE OF BIRTH CALL BACK NUMBER REASON FOR CALL**this is important as we prioritize the call backs  YOU WILL RECEIVE A CALL BACK THE SAME DAY AS LONG AS YOU CALL BEFORE 4:00 PM

## 2024-07-21 NOTE — Progress Notes (Signed)
 Medication Samples have been provided to the patient.  Drug name: farxiga        Strength: 10MG         Qty: 4 BOXES  LOT: [id number]  Exp.Date: 01/20/2027  Dosing instructions: Take 1 tab daily before breakfast  The patient has been instructed regarding the correct time, dose, and frequency of taking this medication, including desired effects and most common side effects.   Collin Knight 3:02 PM 07/21/2024   Medication Samples have been provided to the patient.  Drug name: Eliquis        Strength: 5mg         Qty: 4 boxes  LOT: [id number]  Exp.Date: June 2026  Dosing instructions: Take 1 tab two times daily   The patient has been instructed regarding the correct time, dose, and frequency of taking this medication, including desired effects and most common side effects.   Collin Knight 3:04 PM 07/21/2024

## 2024-07-21 NOTE — Telephone Encounter (Signed)
 Advanced Heart Failure Patient Advocate Encounter  Application for Eliquis  efaxed to [employer] on 07/21/2024. Application form attached to patient chart.  Rachel DEL, CPhT Rx Patient Advocate Phone: [phone]

## 2024-07-22 ENCOUNTER — Ambulatory Visit: Payer: Self-pay | Admitting: Family

## 2024-07-22 ENCOUNTER — Other Ambulatory Visit: Payer: Self-pay

## 2024-07-22 LAB — BASIC METABOLIC PANEL WITH GFR
BUN/Creatinine Ratio: 19 (ref 10–24)
BUN: 17 mg/dL (ref 8–27)
CO2: 19 mmol/L — ABNORMAL LOW (ref 20–29)
Calcium: 8.9 mg/dL (ref 8.6–10.2)
Chloride: 102 mmol/L (ref 96–106)
Creatinine, Ser: 0.91 mg/dL (ref 0.76–1.27)
Glucose: 104 mg/dL — ABNORMAL HIGH (ref 70–99)
Potassium: 4.2 mmol/L (ref 3.5–5.2)
Sodium: 137 mmol/L (ref 134–144)
eGFR: 85 mL/min/1.73 (ref >59)

## 2024-07-22 LAB — MAGNESIUM: Magnesium: 2.1 mg/dL (ref 1.6–2.3)

## 2024-07-28 ENCOUNTER — Encounter: Payer: Self-pay | Admitting: Urology

## 2024-07-28 ENCOUNTER — Ambulatory Visit (INDEPENDENT_AMBULATORY_CARE_PROVIDER_SITE_OTHER): Admitting: Urology

## 2024-07-28 VITALS — BP 131/88 | HR 83 | Ht 70.0 in | Wt 165.0 lb

## 2024-07-28 DIAGNOSIS — Z8551 Personal history of malignant neoplasm of bladder: Secondary | ICD-10-CM

## 2024-07-28 LAB — URINALYSIS, COMPLETE
Bilirubin, UA: NEGATIVE
Leukocytes,UA: NEGATIVE
Nitrite, UA: NEGATIVE
Protein,UA: NEGATIVE
RBC, UA: NEGATIVE
Specific Gravity, UA: 1.015 (ref 1.005–1.030)
Urobilinogen, Ur: 1 mg/dL (ref 0.2–1.0)
pH, UA: 6 (ref 5.0–7.5)

## 2024-07-28 LAB — MICROSCOPIC EXAMINATION: Bacteria, UA: NONE SEEN

## 2024-07-28 NOTE — Progress Notes (Signed)
   07/28/24  CC:  Chief Complaint  Patient presents with   Cysto   Urologic history: 1.  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  Papillary tumor surrounding the right ureteral orifice which was resected.  Right ureteroscopy performed which did show tumor extending into the right distal ureter for a length of ~ 5 mm which was resected; ureteral stent was placed Surveillance cystoscopy/right ureteroscopy 03/31/2024 showed no recurrent tumor; bx bladder erythema return chronic inflammation/vascular ectasia Completed induction BCG 10/31/2023 (stent remained in place) Maintenance BCG x 3 completed: 02/18/2024  HPI: No complaints since last visit.  Denies gross hematuria.  UA microscopy 0-5 WBC/0-2 RBC  Blood pressure 131/88, pulse 83, height 5' 10 (1.778 m), weight 165 lb (74.8 kg). NED. A&Ox3.   No respiratory distress   Abd soft, NT, ND Normal phallus with bilateral descended testicles  Cystoscopy Procedure Note  Patient identification was confirmed, informed consent was obtained, and patient was prepped using Betadine  solution.  Lidocaine  jelly was administered per urethral meatus.     Pre-Procedure: - Inspection reveals a normal caliber ureteral meatus.  Procedure: The flexible cystoscope was introduced without difficulty - No urethral strictures/lesions are present. - Prominent lateral lobe enlargement prostate  - Elevated bladder neck - Bilateral ureteral orifices identified - Bladder mucosa  reveals no ulcers, tumors, or lesions - No bladder stones - Moderate trabeculation; scattered cellules  Retroflexion shows no tumor or intravesical median lobe   Post-Procedure: - Patient tolerated the procedure  well  Assessment/ Plan: No evidence of recurrent tumor Due for maintenance BCG end of month; may be delayed secondary to BCG shortage Surveillance cystoscopy 3 months    Glendia JAYSON Barba, MD

## 2024-07-31 ENCOUNTER — Other Ambulatory Visit (HOSPITAL_COMMUNITY): Payer: Self-pay

## 2024-08-05 ENCOUNTER — Other Ambulatory Visit: Payer: Self-pay

## 2024-08-05 ENCOUNTER — Other Ambulatory Visit (HOSPITAL_COMMUNITY): Payer: Self-pay

## 2024-08-05 NOTE — Telephone Encounter (Signed)
 Spoke to [employer] representative by phone, confirmed patient does not have Part D coverage at this time, only Part A & Part B. Application will be reprocessed and we should receive a determination letter by 08/07/2024.

## 2024-08-07 NOTE — Telephone Encounter (Signed)
 Patient was approved to receive Eliquis  from [employer] Effective 08/05/2024 to 10/21/2024  Left message for patient

## 2024-08-24 ENCOUNTER — Other Ambulatory Visit: Payer: Self-pay

## 2024-09-02 ENCOUNTER — Ambulatory Visit (INDEPENDENT_AMBULATORY_CARE_PROVIDER_SITE_OTHER): Payer: Medicare Other

## 2024-09-02 DIAGNOSIS — I495 Sick sinus syndrome: Secondary | ICD-10-CM | POA: Diagnosis not present

## 2024-09-04 LAB — CUP PACEART REMOTE DEVICE CHECK
Battery Remaining Longevity: 145 mo
Battery Voltage: 3.03 V
Brady Statistic AP VP Percent: 0.04 %
Brady Statistic AP VS Percent: 99.41 %
Brady Statistic AS VP Percent: 0 %
Brady Statistic AS VS Percent: 0.55 %
Brady Statistic RA Percent Paced: 99.64 %
Brady Statistic RV Percent Paced: 0.04 %
Date Time Interrogation Session: 20251111202900
Implantable Lead Connection Status: 753985
Implantable Lead Connection Status: 753985
Implantable Lead Implant Date: 20240430
Implantable Lead Implant Date: 20240430
Implantable Lead Location: 753859
Implantable Lead Location: 753860
Implantable Lead Model: 3830
Implantable Lead Model: 5076
Implantable Pulse Generator Implant Date: 20240430
Lead Channel Impedance Value: 323 Ohm
Lead Channel Impedance Value: 342 Ohm
Lead Channel Impedance Value: 475 Ohm
Lead Channel Impedance Value: 513 Ohm
Lead Channel Pacing Threshold Amplitude: 0.75 V
Lead Channel Pacing Threshold Amplitude: 1.125 V
Lead Channel Pacing Threshold Pulse Width: 0.4 ms
Lead Channel Pacing Threshold Pulse Width: 0.4 ms
Lead Channel Sensing Intrinsic Amplitude: 0.875 mV
Lead Channel Sensing Intrinsic Amplitude: 0.875 mV
Lead Channel Sensing Intrinsic Amplitude: 19.375 mV
Lead Channel Sensing Intrinsic Amplitude: 19.375 mV
Lead Channel Setting Pacing Amplitude: 1.5 V
Lead Channel Setting Pacing Amplitude: 2.5 V
Lead Channel Setting Pacing Pulse Width: 0.4 ms
Lead Channel Setting Sensing Sensitivity: 0.9 mV
Zone Setting Status: 755011

## 2024-09-06 ENCOUNTER — Ambulatory Visit: Payer: Self-pay | Admitting: Cardiology

## 2024-09-07 NOTE — Progress Notes (Signed)
 Remote PPM Transmission

## 2024-09-11 ENCOUNTER — Encounter: Admitting: Internal Medicine

## 2024-10-20 ENCOUNTER — Ambulatory Visit: Admitting: Cardiology

## 2024-10-27 ENCOUNTER — Ambulatory Visit (INDEPENDENT_AMBULATORY_CARE_PROVIDER_SITE_OTHER): Admitting: Urology

## 2024-10-27 ENCOUNTER — Encounter: Payer: Self-pay | Admitting: Urology

## 2024-10-27 VITALS — BP 165/105 | HR 80 | Ht 70.0 in | Wt 165.0 lb

## 2024-10-27 DIAGNOSIS — Z8551 Personal history of malignant neoplasm of bladder: Secondary | ICD-10-CM

## 2024-10-27 LAB — URINALYSIS, COMPLETE
Bilirubin, UA: NEGATIVE
Ketones, UA: NEGATIVE
Leukocytes,UA: NEGATIVE
Nitrite, UA: NEGATIVE
Protein,UA: NEGATIVE
RBC, UA: NEGATIVE
Specific Gravity, UA: <1.005 — ABNORMAL LOW (ref 1.005–1.030)
Urobilinogen, Ur: 0.2 mg/dL (ref 0.2–1.0)
pH, UA: 6 (ref 5.0–7.5)

## 2024-10-27 LAB — MICROSCOPIC EXAMINATION
Bacteria, UA: NONE SEEN
RBC, Urine: NONE SEEN /HPF (ref 0–2)

## 2024-10-27 NOTE — Progress Notes (Signed)
" ° °  10/27/2024  CC:  Chief Complaint  Patient presents with   Cysto   Urologic history: 1.  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  Papillary tumor surrounding the right ureteral orifice which was resected.  Right ureteroscopy performed which did show tumor extending into the right distal ureter for a length of ~ 5 mm which was resected; ureteral stent was placed Surveillance cystoscopy/right ureteroscopy 03/31/2024 showed no recurrent tumor; bx bladder erythema return chronic inflammation/vascular ectasia Completed induction BCG 10/31/2023 (stent remained in place) Maintenance BCG x 3 completed: 02/18/2024  HPI:   Cystoscopy Procedure Note  Patient identification was confirmed, informed consent was obtained, and patient was prepped using Betadine  solution.  Lidocaine  jelly was administered per urethral meatus.     Pre-Procedure: - Inspection reveals a normal caliber ureteral meatus.  Procedure: The flexible cystoscope was introduced without difficulty - No urethral strictures/lesions are present. - Prominent lateral lobe enlargement prostate  - Elevated bladder neck - Bilateral ureteral orifices identified - Bladder mucosa  reveals no ulcers, tumors, or lesions - No bladder stones - Moderate trabeculation; scattered cellules  Retroflexion shows no tumor or intravesical median lobe   Post-Procedure: - Patient tolerated the procedure well  Assessment/ Plan: No evidence of recurrent tumor He is on our maintenance BCG list and will contact when available Surveillance cystoscopy 3 months    Collin JAYSON Barba, MD "

## 2024-11-03 ENCOUNTER — Ambulatory Visit: Attending: Cardiology | Admitting: Cardiology

## 2024-11-03 ENCOUNTER — Encounter: Payer: Self-pay | Admitting: Cardiology

## 2024-11-03 VITALS — BP 130/82 | HR 62 | Wt 159.2 lb

## 2024-11-03 DIAGNOSIS — I4819 Other persistent atrial fibrillation: Secondary | ICD-10-CM | POA: Diagnosis not present

## 2024-11-03 DIAGNOSIS — D6869 Other thrombophilia: Secondary | ICD-10-CM | POA: Diagnosis not present

## 2024-11-03 DIAGNOSIS — Z950 Presence of cardiac pacemaker: Secondary | ICD-10-CM | POA: Insufficient documentation

## 2024-11-03 DIAGNOSIS — I495 Sick sinus syndrome: Secondary | ICD-10-CM | POA: Insufficient documentation

## 2024-11-03 DIAGNOSIS — Z79899 Other long term (current) drug therapy: Secondary | ICD-10-CM | POA: Diagnosis present

## 2024-11-03 NOTE — Progress Notes (Signed)
 "     Electrophysiology Clinic Note    Date:  11/03/2024  Patient ID:  [facility] DOB [birth year], MRN [id number] PCP:  Valora Lynwood FALCON, MD  Cardiologist:  Marsa Dooms, MD  Electrophysiologist:  Collin Kitty, MD  Electrophysiology APP:  Loyalty Brashier, NP    Discussed the use of AI scribe software for clinical note transcription with the patient, who gave verbal consent to proceed.   Patient Profile    Chief Complaint: AF, amio follow-up  History of Present Illness: Collin Nielson. is a 79 y.o. 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 Cindie) for routine electrophysiology followup.   He is s/p attempted BiV device, but unable to implant CS lead, so transition to dual chamber PPM with LBA lead on 02/19/2023 by Dr. Cindie. He has a leadless PPM left in place that was deactivated.   I last saw him 02/2024 at which time he had self-reduced eliquis . Several brief AF episodes noted on PPM, he was asymptomatic. Amiodarone  was continued.  He saw HF NP 06/2024 where he indicated that eliquis  was unaffordable and requested to transition to warfarin  On follow-up today, he is having episodes of palpitations multiple days a week that wake him up around 4am. These episodes are happening multiple days per week. He thinks these are anxiety, but worried it may be AFib. He is on zoloft  for anxiety, managed by PCP.   He received a letter from eliquis  manufacturer that he would no longer qualify for patient assistance, but then he received a refill with low price, so he is not sure whether the following refill will be $600/mon or the lower cost.        Arrhythmia/Device History Medtronic dual chamber PPM, imp 01/2023; dx SND and HFrEF - RV lead is LBBA     AAD History: Amiodarone      ROS:  Please see the history of  present illness. All other systems are reviewed and otherwise negative.    Physical Exam    VS:  BP 130/82 (BP Location: Left Arm, Patient Position: Sitting, Cuff Size: Normal)   Pulse 62   Wt 159 lb 3.2 oz (72.2 kg)   SpO2 95%   BMI 22.84 kg/m  BMI: Body mass index is 22.84 kg/m.           Wt Readings from Last 3 Encounters:  11/03/24 159 lb 3.2 oz (72.2 kg)  10/27/24 165 lb (74.8 kg)  07/28/24 165 lb (74.8 kg)     GEN- The patient is well appearing, alert and oriented x 3 today.   Lungs- Clear to ausculation bilaterally, normal work of breathing.  Heart- Regular rate and rhythm, no murmurs, rubs or gallops Extremities- No peripheral edema, warm, dry Skin-  device pocket well-healed, no tethering   Brief check performed without iterative lead testing/measurements Very brief, rare AF episodes Overall AF burden 0%  Studies Reviewed   Previous EP, cardiology notes.    EKG is ordered. Personal review of EKG from today shows:    EKG Interpretation Date/Time:  Tuesday November 03 2024 14:27:59 EST Ventricular Rate:  62 PR Interval:  254 QRS Duration:  88 QT Interval:  436 QTC Calculation: 442 R Axis:   -43  Text Interpretation: Atrial-paced rhythm with prolonged AV conduction Left axis deviation Confirmed by Lorre Opdahl [phone]) on 11/03/2024 2:29:35  PM    TTE, 04/30/2023  1. Left ventricular ejection fraction, by estimation, is 50 to 55%. The left ventricle has low normal function. The left ventricle has no regional wall motion abnormalities. There is mild left ventricular hypertrophy. Left ventricular diastolic parameters are consistent with Grade I diastolic dysfunction (impaired relaxation). The average left ventricular global longitudinal strain is -17.4 %. The global longitudinal strain is normal.   2. Right ventricular systolic function is normal. The right ventricular size is normal. Tricuspid regurgitation signal is inadequate for assessing PA pressure.   3. The  mitral valve is abnormal. Mild mitral valve regurgitation. No evidence of mitral stenosis. 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   4. The aortic valve is tricuspid. Aortic valve regurgitation is not visualized. No aortic stenosis is present.   5. 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   6. The inferior vena cava is normal in size with greater than 50% respiratory variability, suggesting right atrial pressure of 3 mmHg.   Comparison(s): A prior study was performed on 12/14/2022. The left ventricular function has improved.    TTE, 12/14/2022  1. Left ventricular ejection fraction, by estimation, is 25 to 30%. The left ventricle has severely decreased function. The left ventricle demonstrates global hypokinesis. The left ventricular internal cavity size was mildly dilated. There is mild left ventricular hypertrophy. Left ventricular diastolic parameters are indeterminate. The average left ventricular global longitudinal strain is -9.9 %. The global longitudinal strain is abnormal.   2. Right ventricular systolic function is normal. The right ventricular size is normal. There is normal pulmonary artery systolic pressure.   3. The mitral valve is normal in structure. Moderate mitral valve regurgitation. No evidence of mitral stenosis.   4. Tricuspid valve regurgitation is mild to moderate.   5. The aortic valve is normal in structure. Aortic valve regurgitation is mild. No aortic stenosis is present. Aortic valve mean gradient measures 2.0 mmHg.   6. There is borderline dilatation of the aortic root, measuring 39 mm.   7. The inferior vena cava is normal in size with greater than 50% respiratory variability, suggesting right atrial pressure of 3 mmHg.    Assessment and Plan     #) persis AFib #) amiodarone  monitoring Overall very low AF burden, does not correlate with the 2-3 day/week of palpitation patient is experiencing Continue 100mg   amiodarone  daily Update LFT, thyroid  labs today  #) Hypercoag d/t peris afib CHA2DS2-VASc Score = at least 3 [CHF History: 1, HTN History: 0, Diabetes History: 0, Stroke History: 0, Vascular Disease History: 0, Age Score: 2, Gender Score: 0].  Therefore, the patient's annual risk of stroke is 3.2 %.    Stroke ppx - 5mg  eliquis  BID, appropriately dosed No bleeding concerns Patient desires to stay on eliquis  if can remain affordable. Will refer to pharmacy team for assistance If not affordable, he will transition to warfarin. He and Dr. Ammon have already discussed this transition, and patient prefers to have [facility] manage warfarin.   #) SND s/p PPM Recent remote transmission with good battery, stable lead measurements  #) palpitations #) anxiety Encouraged him to discuss further anxiety mgmt with PCP     Current medicines are reviewed at length with the patient today.   The patient has concerns regarding his medicines.  The following changes were made today:  none  Labs/ tests ordered today include:  Orders Placed This Encounter  Procedures   CBC  Comprehensive metabolic panel with GFR   T4, free   TSH   EKG 12-Lead     Disposition: Follow up with Dr. Kennyth or EP APP in 6 months   Signed, Deatrice Spanbauer, NP  11/03/2024  5:10 PM  Electrophysiology [facility] "

## 2024-11-03 NOTE — Patient Instructions (Signed)
 Medication Instructions:  Your physician recommends that you continue on your current medications as directed. Please refer to the Current Medication list given to you today.  *If you need a refill on your cardiac medications before your next appointment, please call your pharmacy*  Lab Work: Your provider would like for you to have following labs drawn today CBC, CMP, TSH and T4.   If you have labs (blood work) drawn today and your tests are completely normal, you will receive your results only by: MyChart Message (if you have MyChart) OR A paper copy in the mail If you have any lab test that is abnormal or we need to change your treatment, we will call you to review the results.  Testing/Procedures: No test ordered today   Follow-Up: At [facility], you and your health needs are our priority.  As part of our continuing mission to provide you with exceptional heart care, our providers are all part of one team.  This team includes your primary Cardiologist (physician) and Advanced Practice Providers or APPs (Physician Assistants and Nurse Practitioners) who all work together to provide you with the care you need, when you need it.  Your next appointment:   6 month(s)  Provider:   Suzann Riddle, NP or Dr. Fonda Kitty    We recommend signing up for the patient portal called MyChart.  Sign up information is provided on this After Visit Summary.  MyChart is used to connect with patients for Virtual Visits (Telemedicine).  Patients are able to view lab/test results, encounter notes, upcoming appointments, etc.  Non-urgent messages can be sent to your provider as well.   To learn more about what you can do with MyChart, go to [email or web address].

## 2024-11-04 ENCOUNTER — Ambulatory Visit: Payer: Self-pay | Admitting: Cardiology

## 2024-11-04 ENCOUNTER — Telehealth: Payer: Self-pay

## 2024-11-04 ENCOUNTER — Telehealth: Payer: Self-pay | Admitting: Pharmacy Technician

## 2024-11-04 ENCOUNTER — Other Ambulatory Visit (HOSPITAL_COMMUNITY): Payer: Self-pay

## 2024-11-04 LAB — CBC
Hematocrit: 43.1 % (ref 37.5–51.0)
Hemoglobin: 14.8 g/dL (ref 13.0–17.7)
MCH: 35.9 pg — ABNORMAL HIGH (ref 26.6–33.0)
MCHC: 34.3 g/dL (ref 31.5–35.7)
MCV: 105 fL — ABNORMAL HIGH (ref 79–97)
Platelets: 279 x10E3/uL (ref 150–450)
RBC: 4.12 x10E6/uL — ABNORMAL LOW (ref 4.14–5.80)
RDW: 12.5 % (ref 11.6–15.4)
WBC: 7.7 x10E3/uL (ref 3.4–10.8)

## 2024-11-04 LAB — COMPREHENSIVE METABOLIC PANEL WITH GFR
ALT: 15 IU/L (ref 0–44)
AST: 20 IU/L (ref 0–40)
Albumin: 4.3 g/dL (ref 3.7–4.7)
Alkaline Phosphatase: 68 IU/L (ref 48–129)
BUN/Creatinine Ratio: 17 (ref 10–24)
BUN: 14 mg/dL (ref 8–27)
Bilirubin Total: 0.4 mg/dL (ref 0.0–1.2)
CO2: 20 mmol/L (ref 20–29)
Calcium: 9.3 mg/dL (ref 8.6–10.2)
Chloride: 98 mmol/L (ref 96–106)
Creatinine, Ser: 0.82 mg/dL (ref 0.76–1.27)
Globulin, Total: 2.5 g/dL (ref 1.5–4.5)
Glucose: 77 mg/dL (ref 70–99)
Potassium: 4.2 mmol/L (ref 3.5–5.2)
Sodium: 135 mmol/L (ref 134–144)
Total Protein: 6.8 g/dL (ref 6.0–8.5)
eGFR: 88 mL/min/1.73

## 2024-11-04 LAB — TSH: TSH: 1.57 u[IU]/mL (ref 0.450–4.500)

## 2024-11-04 LAB — T4, FREE: Free T4: 1.29 ng/dL (ref 0.82–1.77)

## 2024-11-04 NOTE — Telephone Encounter (Signed)
 I spoke with Collin Knight in regards to an update from our Pharmacy Assistance Team. They will be sending out paperwork for Farxiga  assistance. In regards to Eliquis  assistance,as per Pharmacy, Patient has Medicare part a/b insurance and not part d. [employer] is requiring everyone to pay 3% of their income on their prescription drug cost before they will be approved. [employer] [place] does not. If he can take xarelto , I can send him a [employer] application to try to get him approved for xarelto .  As per Suzann Riddle, NP,  I am OK with him switching from eliquis   over Xarelto  if the patient is. He would be on 20mg  daily Xarelto  (take with largest meal of the day). Collin Knight agrees to switching to Xarelto  and to expect paperwork from the Pharmacy Team for [place] [employer] [place] for Xarelto  assistance. Patient verbalized understanding and all questions answered at this time. Suzann Riddle, NP notified.

## 2024-11-04 NOTE — Telephone Encounter (Signed)
" ° °  Sent message to see if he can have xarelto  instead. He has part a/b.   If yes, will send xarelto  application  "

## 2024-11-04 NOTE — Telephone Encounter (Signed)
" ° ° °  Send out xarelto  application thurs  "

## 2024-11-04 NOTE — Telephone Encounter (Signed)
" ° °  Send farxiga  pap tomorrow in office and send to office  "

## 2024-11-04 NOTE — Telephone Encounter (Signed)
-----   Message from Chantal Needle, NP sent at 11/04/2024  4:14 PM EST ----- Regarding: RE: Eliquis  assistance I am OK with him switching from eliquis  > xarelto  if the patient is.  He would be on 20mg  daily xarelto  (take with largest meal of the day). ----- Message ----- From: Dulce Leita LABOR, RN Sent: 11/04/2024   4:12 PM EST To: Chantal Needle, NP Subject: FW: Eliquis  assistance                         Just keeping you in the loop, nothing to do on your end. ----- Message ----- From: Glenice Krabbe, CPhT Sent: 11/04/2024   4:08 PM EST To: Leita LABOR Dulce, RN Subject: RE: Eliquis  assistance                         Hi, it appears he only has part a/b insurance and not part d. [employer] is requiring everyone to pay 3% of their income on their prescription drug cost before they will be approved. [employer] [place] does not. If he can take xarelto , I can send him a [employer]ohnson [employer] johnson application to try to get him approved for xarelto . ----- Message ----- From: Dulce Leita LABOR, RN Sent: 11/03/2024   2:47 PM EST To: Rx Med Assistance Team Subject: Eliquis  assistance                             Good afternoon! Collin Knight is in need of assistance with the cost of Eliquis  please and thank you. Please let me know if there is anything I can do to help. Thank you.

## 2024-11-05 ENCOUNTER — Other Ambulatory Visit: Payer: Self-pay

## 2024-11-05 MED ORDER — RIVAROXABAN 20 MG PO TABS: 20.0000 mg | ORAL_TABLET | Freq: Every day | ORAL | 3 refills | Status: AC

## 2024-11-05 NOTE — Telephone Encounter (Signed)
 Provider application paperwork for the patient assistance programs for Xarelto  ans Farxiga  faxed to Pharmacy/ [facility]. Original and fax confirmation sent to be imaged for patient chart.

## 2024-11-05 NOTE — Telephone Encounter (Signed)
 Hi, can someone please get the provider to sign the form that is scanned in media under Farxiga   PROVIDER APPLICATION for this patient and fax back to [phone]? Thank you!   PAP: Patient assistance application for Farxiga  through [employer] ([employer]) has been mailed to pt's home address on file.

## 2024-11-05 NOTE — Telephone Encounter (Signed)
 Hi, can someone please get the provider to sign the form that is scanned in media under Xarelto   PROVIDER APPLICATION for this patient and fax back to [phone]? Thank you!   PAP: Patient assistance application for Xarelto  through [employer] ([employer]) has been mailed to pt's home address on file.

## 2024-11-05 NOTE — Telephone Encounter (Signed)
 Received provider application  Waiting on patient application

## 2024-11-05 NOTE — Telephone Encounter (Signed)
 Received provider portion in media  Waiting on patient portion

## 2024-11-19 NOTE — Telephone Encounter (Signed)
 Sent mychart to follow up on applications for Xarelto  and Farxiga .

## 2024-11-25 ENCOUNTER — Other Ambulatory Visit (HOSPITAL_COMMUNITY): Payer: Self-pay

## 2024-11-25 MED ORDER — APIXABAN 5 MG PO TABS: 5.0000 mg | ORAL_TABLET | Freq: Two times a day (BID) | ORAL | Status: AC

## 2024-11-25 MED ORDER — DAPAGLIFLOZIN PROPANEDIOL 10 MG PO TABS: 10.0000 mg | ORAL_TABLET | Freq: Every day | ORAL | Status: AC

## 2024-11-25 NOTE — Telephone Encounter (Signed)
 PAP: Patient assistance application for Farxiga  has been approved by PAP Companies: [employer] from 11/25/24 to 10/21/25. Medication should be delivered to PAP Delivery: Home. For further shipping updates, please contact [employer] ([employer]) at [phone]. Patient ID is: [id number]

## 2024-11-25 NOTE — Telephone Encounter (Signed)
 Hi, the patient only has a couple days left of farxiga  10mg . I sent [employer] his application today but even if he is approved it will take some time to get to him from their mail. Can he have a couple samples?

## 2024-11-25 NOTE — Telephone Encounter (Signed)
 Hi, the patient only has a couple days left of his eliquis . I sent in his application to [facility] and johnson for his xarelto  that he is being changed to today. But even if he gets approved today, it will take some time to get to him. Can he have a couple samples of the eliquis  or his xarelto ?
